# Patient Record
Sex: Male | Born: 1935
Health system: Southern US, Community
[De-identification: ages and names within clinical notes are randomized; demographics above are authoritative.]

## PROBLEM LIST (undated history)

## (undated) ENCOUNTER — Ambulatory Visit: Admission: EM | Payer: Medicare HMO

## (undated) DIAGNOSIS — I10 Essential (primary) hypertension: Secondary | ICD-10-CM

## (undated) DIAGNOSIS — Z83719 Family history of colon polyps, unspecified: Secondary | ICD-10-CM

## (undated) DIAGNOSIS — I4892 Unspecified atrial flutter: Secondary | ICD-10-CM

## (undated) DIAGNOSIS — E785 Hyperlipidemia, unspecified: Secondary | ICD-10-CM

## (undated) DIAGNOSIS — Z8371 Family history of colonic polyps: Secondary | ICD-10-CM

## (undated) DIAGNOSIS — C801 Malignant (primary) neoplasm, unspecified: Secondary | ICD-10-CM

## (undated) DIAGNOSIS — I4510 Unspecified right bundle-branch block: Secondary | ICD-10-CM

## (undated) DIAGNOSIS — Z8546 Personal history of malignant neoplasm of prostate: Secondary | ICD-10-CM

## (undated) DIAGNOSIS — K449 Diaphragmatic hernia without obstruction or gangrene: Secondary | ICD-10-CM

## (undated) HISTORY — DX: Family history of colon polyps, unspecified: Z83.719

## (undated) HISTORY — PX: EYE SURGERY: SHX253

## (undated) HISTORY — DX: Family history of colonic polyps: Z83.71

## (undated) HISTORY — PX: PROSTATE SURGERY: SHX751

## (undated) HISTORY — DX: Unspecified right bundle-branch block: I45.10

## (undated) HISTORY — DX: Malignant (primary) neoplasm, unspecified: C80.1

## (undated) HISTORY — DX: Unspecified atrial flutter: I48.92

## (undated) HISTORY — DX: Personal history of malignant neoplasm of prostate: Z85.46

## (undated) HISTORY — DX: Diaphragmatic hernia without obstruction or gangrene: K44.9

## (undated) HISTORY — DX: Essential (primary) hypertension: I10

## (undated) HISTORY — PX: HERNIA REPAIR: SHX51

## (undated) HISTORY — DX: Hyperlipidemia, unspecified: E78.5

---

## 1998-04-30 ENCOUNTER — Encounter: Admission: RE | Admit: 1998-04-30 | Discharge: 1998-07-29 | Payer: Self-pay | Admitting: Urology

## 1998-07-30 ENCOUNTER — Encounter: Admission: RE | Admit: 1998-07-30 | Discharge: 1998-10-28 | Payer: Self-pay | Admitting: Radiation Oncology

## 1998-09-21 ENCOUNTER — Encounter: Payer: Self-pay | Admitting: Urology

## 1998-09-21 ENCOUNTER — Ambulatory Visit (HOSPITAL_BASED_OUTPATIENT_CLINIC_OR_DEPARTMENT_OTHER): Admission: RE | Admit: 1998-09-21 | Discharge: 1998-09-21 | Payer: Self-pay | Admitting: Urology

## 1998-11-03 ENCOUNTER — Encounter: Admission: RE | Admit: 1998-11-03 | Discharge: 1998-12-14 | Payer: Self-pay | Admitting: Radiation Oncology

## 1998-12-15 ENCOUNTER — Encounter: Admission: RE | Admit: 1998-12-15 | Discharge: 1999-03-15 | Payer: Self-pay | Admitting: Radiation Oncology

## 2003-01-24 ENCOUNTER — Encounter: Payer: Self-pay | Admitting: Ophthalmology

## 2003-01-28 ENCOUNTER — Ambulatory Visit (HOSPITAL_COMMUNITY): Admission: RE | Admit: 2003-01-28 | Discharge: 2003-01-28 | Payer: Self-pay | Admitting: Ophthalmology

## 2005-02-17 ENCOUNTER — Encounter (HOSPITAL_COMMUNITY): Admission: RE | Admit: 2005-02-17 | Discharge: 2005-05-10 | Payer: Self-pay | Admitting: Urology

## 2005-08-10 ENCOUNTER — Emergency Department (HOSPITAL_COMMUNITY): Admission: EM | Admit: 2005-08-10 | Discharge: 2005-08-10 | Payer: Self-pay | Admitting: Emergency Medicine

## 2005-10-13 ENCOUNTER — Encounter: Admission: RE | Admit: 2005-10-13 | Discharge: 2005-10-13 | Payer: Self-pay | Admitting: Family Medicine

## 2006-04-26 ENCOUNTER — Encounter (HOSPITAL_COMMUNITY): Admission: RE | Admit: 2006-04-26 | Discharge: 2006-05-01 | Payer: Self-pay | Admitting: Urology

## 2009-12-02 ENCOUNTER — Ambulatory Visit: Payer: Self-pay | Admitting: Family Medicine

## 2009-12-17 ENCOUNTER — Encounter: Payer: Self-pay | Admitting: Family Medicine

## 2009-12-24 ENCOUNTER — Ambulatory Visit: Payer: Self-pay | Admitting: Family Medicine

## 2009-12-24 ENCOUNTER — Telehealth: Payer: Self-pay | Admitting: Cardiovascular Disease

## 2009-12-24 DIAGNOSIS — I4892 Unspecified atrial flutter: Secondary | ICD-10-CM

## 2009-12-25 DIAGNOSIS — R000 Tachycardia, unspecified: Secondary | ICD-10-CM

## 2009-12-25 DIAGNOSIS — I4510 Unspecified right bundle-branch block: Secondary | ICD-10-CM | POA: Insufficient documentation

## 2009-12-28 ENCOUNTER — Encounter: Payer: Self-pay | Admitting: Cardiovascular Disease

## 2009-12-28 ENCOUNTER — Ambulatory Visit: Payer: Self-pay | Admitting: Internal Medicine

## 2009-12-28 ENCOUNTER — Ambulatory Visit: Payer: Self-pay | Admitting: Cardiology

## 2009-12-28 ENCOUNTER — Ambulatory Visit (HOSPITAL_COMMUNITY): Admission: RE | Admit: 2009-12-28 | Discharge: 2009-12-28 | Payer: Self-pay | Admitting: Cardiovascular Disease

## 2009-12-28 ENCOUNTER — Ambulatory Visit: Payer: Self-pay

## 2009-12-30 ENCOUNTER — Encounter: Payer: Self-pay | Admitting: Internal Medicine

## 2009-12-30 ENCOUNTER — Telehealth: Payer: Self-pay | Admitting: Internal Medicine

## 2010-01-05 ENCOUNTER — Ambulatory Visit (HOSPITAL_COMMUNITY): Admission: RE | Admit: 2010-01-05 | Discharge: 2010-01-06 | Payer: Self-pay | Admitting: Internal Medicine

## 2010-01-05 ENCOUNTER — Ambulatory Visit: Payer: Self-pay | Admitting: Internal Medicine

## 2010-01-07 ENCOUNTER — Ambulatory Visit: Payer: Self-pay | Admitting: Family Medicine

## 2010-02-12 ENCOUNTER — Telehealth: Payer: Self-pay | Admitting: Internal Medicine

## 2010-02-15 ENCOUNTER — Ambulatory Visit: Payer: Self-pay | Admitting: Internal Medicine

## 2010-04-27 ENCOUNTER — Encounter: Payer: Self-pay | Admitting: Family Medicine

## 2010-05-03 ENCOUNTER — Ambulatory Visit: Payer: Self-pay | Admitting: Family Medicine

## 2010-05-03 DIAGNOSIS — J019 Acute sinusitis, unspecified: Secondary | ICD-10-CM

## 2010-06-13 LAB — CONVERTED CEMR LAB
Alkaline Phosphatase: 75 units/L (ref 39–117)
BUN: 18 mg/dL (ref 6–23)
Basophils Absolute: 0 10*3/uL (ref 0.0–0.1)
Bilirubin, Direct: 0.2 mg/dL (ref 0.0–0.3)
CO2: 26 meq/L (ref 19–32)
Calcium: 9.2 mg/dL (ref 8.4–10.5)
Chloride: 108 meq/L (ref 96–112)
Creatinine, Ser: 0.9 mg/dL (ref 0.4–1.5)
Eosinophils Absolute: 0 10*3/uL (ref 0.0–0.7)
Glucose, Bld: 78 mg/dL (ref 70–99)
Lymphocytes Relative: 24.4 % (ref 12.0–46.0)
MCHC: 34.8 g/dL (ref 30.0–36.0)
Neutro Abs: 4.8 10*3/uL (ref 1.4–7.7)
Neutrophils Relative %: 64.5 % (ref 43.0–77.0)
Platelets: 160 10*3/uL (ref 150.0–400.0)
Potassium: 4.2 meq/L (ref 3.5–5.1)
RDW: 14.8 % — ABNORMAL HIGH (ref 11.5–14.6)
Sodium: 145 meq/L (ref 135–145)
Total Bilirubin: 0.8 mg/dL (ref 0.3–1.2)
Total Protein: 6.5 g/dL (ref 6.0–8.3)
VLDL: 21.4 mg/dL (ref 0.0–40.0)

## 2010-06-15 NOTE — Assessment & Plan Note (Signed)
Summary: HOSPITAL FOLLOWUP/KN   Vital Signs:  Patient profile:   77 year old male Weight:      203 pounds Pulse rate:   82 / minute BP sitting:   128 / 62  (left arm)  Vitals Entered By: Doristine Devoid CMA (January 07, 2010 3:01 PM) CC: hosp f/u    History of Present Illness: 76 yo man here today for hospital f/u after ablation for Aflutter.  has had some cough s/p TEE but this is improving.  metoprolol has decreased to 1/2 tab two times a day.  reports feeling well- no CP, palpitations, SOB.  now on Pradaxa for 30 days.  reviewed D/C summary.  Problems Prior to Update: 1)  Physical Examination  (ICD-V70.0) 2)  Right Bundle Branch Block  (ICD-426.4) 3)  Tachycardia  (ICD-785.0) 4)  Atrial Flutter  (ICD-427.32)  Current Medications (verified): 1)  Fish Oil .Marland Kitchen.. 1 By Mouth Qd 2)  Metoprolol Tartrate 50 Mg Tabs (Metoprolol Tartrate) .... Take 1/2 Tab By Mouth Two Times Per Day 3)  Pradaxa 150 Mg Caps (Dabigatran Etexilate Mesylate) .... 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  Married Alcohol use-occasional beer (not daily) Drug use-no Former Smoker-quit at age 40  Review of Systems      See HPI  Physical Exam  General:  Well-developed,well-nourished,in no acute distress; alert,appropriate and cooperative throughout examination Lungs:  Normal respiratory effort, chest expands symmetrically. Lungs are clear to auscultation, no crackles or wheezes. Heart:  reg S1/S2, no M/R/G Pulses:  +2 carotid, radial, PT Extremities:  +1 edema of LE bilaterally Psych:  Oriented X3, memory intact for recent and remote, normally interactive, good eye contact, not anxious  appearing, and not depressed appearing.     Impression & Recommendations:  Problem # 1:  ATRIAL FLUTTER (ICD-427.32) Assessment Unchanged s/p radiofrequency ablation.  doing well.  asymptomatic.  reviewed duration of Pradaxa tx and new dose of metoprolol.  pt aware and understands. His updated medication list for this problem includes:    Metoprolol Tartrate 50 Mg Tabs (Metoprolol tartrate) .Marland Kitchen... Take 1/2 tab by mouth two times per day  Complete Medication List: 1)  Fish Oil  .Marland Kitchen.. 1 by mouth qd 2)  Metoprolol Tartrate 50 Mg Tabs (Metoprolol tartrate) .... Take 1/2 tab by mouth two times per day 3)  Pradaxa 150 Mg Caps (Dabigatran etexilate mesylate) .... One by mouth two times a day  Patient Instructions: 1)  Follow up in 6 months- sooner if needed 2)  Go see cardiology as scheduled on Oct 3rd 3)  You'll take 1 month of the Pradaxa and then stop 4)  Continue the 1/2 tab of Metoprolol until Cardiology says otherwise 5)  Call with any questions or concerns 6)  I'm so glad you're doing well!!!

## 2010-06-15 NOTE — Assessment & Plan Note (Signed)
Summary: CPX/FASTING//KN--Rm 17   Vital Signs:  Patient profile:   77 year old male Height:      70 inches Weight:      195 pounds BMI:     28.08 Temp:     97.8 degrees F oral Pulse rate:   90 / minute Pulse rhythm:   irregular Resp:     16 per minute BP sitting:   130 / 90  (right arm) Cuff size:   large  Vitals Entered By: Mervin Kung CMA Duncan Dull) (December 24, 2009 7:56 AM) Is Patient Diabetic? No   History of Present Illness: 77 yo man here today for CPE.    Here for Medicare AWV:  1.   Risk factors based on Past M, S, F history: prostate CA- follows w/ Dr Isabel Caprice, had appt last month overweight- not exercising, doing yard work regularly, due for screening lipids, BMP 2.   Physical Activities: doing yard work 3.   Depression/mood: denies sxs of depression, feels mood is 'good' 4.   Hearing: no concerns, normal to whispered voice 5.   ADL's: independent 6.   Fall Risk: doesn't feel he's at risk for falls 7.   Home Safety: lives w/ wife, feels safe at home, tries to avoid basement stairs 8.   Height, weight, &visual acuity: See vitals, vision corrected to 20/20 w/ glasses 9.   Counseling: provided on healthy diet, regular exercise, eye exams, dental exams 10.   Labs ordered based on risk factors: see A&P 11.           Referral Coordination: colonoscopy 4-5 yrs ago, eye exam 7/11 12.           Care Plan: see A&P 13.           Cognitive Assessment: 2/3 object recall, W-O-R-L-D   Preventive Screening-Counseling & Management  Alcohol-Tobacco     Alcohol drinks/day: <1     Alcohol type: beer     Smoking Status: quit     Year Quit: 1961  Caffeine-Diet-Exercise     Does Patient Exercise: no      Sexual History:  currently monogamous.        Drug Use:  never.    Current Medications (verified): 1)  Fish Oil .Marland Kitchen.. 1 By Mouth Qd  Allergies (verified): No Known Drug Allergies  Past History:  Past Medical History: Last updated: 12/02/2009 Prostate Cancer Colon  polyps  Past Surgical History: Last updated: 12/02/2009 Prostate Seed Implant  Family History: Last updated: 12/02/2009 CAD- none Parkinsons- brothers, sister Prostate CA- brother  Social History: Last updated: 12/02/2009 Retired, previously a Naval architect Married Alcohol use-yes Drug use-no Former Social research officer, government at age 50  Social History: Does Patient Exercise:  no Drug Use:  never  Review of Systems  The patient denies anorexia, fever, weight loss, weight gain, vision loss, decreased hearing, hoarseness, chest pain, syncope, dyspnea on exertion, peripheral edema, prolonged cough, headaches, abdominal pain, melena, hematochezia, severe indigestion/heartburn, hematuria, suspicious skin lesions, depression, abnormal bleeding, enlarged lymph nodes, and testicular masses.    Physical Exam  General:  Well-developed,well-nourished,in no acute distress; alert,appropriate and cooperative throughout examination Head:  Normocephalic and atraumatic without obvious abnormalities. No apparent alopecia or balding. Eyes:  PERRL, EOMI Ears:  External ear exam shows no significant lesions or deformities.  Otoscopic examination reveals clear canals, tympanic membranes are intact bilaterally without bulging, retraction, inflammation or discharge- extensive scarring. Hearing is grossly normal bilaterally. Nose:  External nasal examination shows no deformity or inflammation. Nasal  mucosa are pink and moist without lesions or exudates. Mouth:  Oral mucosa and oropharynx without lesions or exudates.  Teeth in good repair. Neck:  No deformities, masses, or tenderness noted. Lungs:  Normal respiratory effort, chest expands symmetrically. Lungs are clear to auscultation, no crackles or wheezes. Heart:  reg S1/S2, no M/R/G Abdomen:  Bowel sounds positive,abdomen soft and non-tender without masses, organomegaly or hernias noted. Rectal:  deferred to urology (exam 7/11) Genitalia:  Testes bilaterally  descended without nodularity, tenderness or masses. No scrotal masses or lesions. No penis lesions or urethral discharge. Prostate:  deferred to urology Pulses:  +2 carotid, radial, DP Extremities:  No clubbing, cyanosis, edema, or deformity noted with normal full range of motion of all joints.   Neurologic:  No cranial nerve deficits noted. Station and gait are normal. Plantar reflexes are down-going bilaterally. DTRs are symmetrical throughout. Sensory, motor and coordinative functions appear intact. Skin:  Intact without suspicious lesions or rashes Cervical Nodes:  No lymphadenopathy noted Inguinal Nodes:  No significant adenopathy Psych:  Cognition and judgment appear intact. Alert and cooperative with normal attention span and concentration. No apparent delusions, illusions, hallucinations   Impression & Recommendations:  Problem # 1:  PHYSICAL EXAMINATION (ICD-V70.0) Assessment New PE WNL w/ exception of EKG (see below).  UTD on health maintainence.  anticipatory guidance provided. Orders: Venipuncture (27517) EKG w/ Interpretation (93000) TLB-Lipid Panel (80061-LIPID) TLB-BMP (Basic Metabolic Panel-BMET) (80048-METABOL) TLB-CBC Platelet - w/Differential (85025-CBCD) TLB-Hepatic/Liver Function Pnl (80076-HEPATIC) TLB-TSH (Thyroid Stimulating Hormone) (84443-TSH) Medicare -1st Annual Wellness Visit [phone])  Problem # 2:  ATRIAL FLUTTER (ICD-427.32) Assessment: New  pt had routine screening EKG and found to be in asymptomatic aflutter.  called cards and had discussion- pt to start ASA and metoprolol and f/u w/ cards on Monday.  pt aware and in agreement. His updated medication list for this problem includes:    Metoprolol Tartrate 50 Mg Tabs (Metoprolol tartrate) .Marland Kitchen... Take 1 tab by mouth two times per day  Orders: Prescription Created Electronically [id number])  Complete Medication List: 1)  Fish Oil  .Marland Kitchen.. 1 by mouth qd 2)  Metoprolol Tartrate 50 Mg Tabs (Metoprolol tartrate)  .... Take 1 tab by mouth two times per day  Patient Instructions: 1)  Someone will call you from the cardiology office to let you know about your appointment on Monday 2)  We'll notify you of your lab results and determine when you need to follow up 3)  Please start taking an Aspirin daily- 325mg  4)  Start the Metoprolol two times a day as directed 5)  Call with any questions or concerns 6)  Hang in there! Prescriptions: METOPROLOL TARTRATE 50 MG TABS (METOPROLOL TARTRATE) Take 1 tab by mouth two times per day  #60 x 3   Entered and Authorized by:   [name] MD   Signed by:   [name] MD on 12/24/2009   Method used:   Electronically to        [facility] [address] [phone]* (retail)       [address]       [place], [place]  [address]       Ph: [phone]       Fax: [phone]   RxID:   [id number]   Current Allergies (reviewed today): No known allergies    Preventive Care Screening     Pt thinks he had colonoscopy 4-5 years ago?-- ?polyps  thinks he had bone density 4 yrs ago but doesn't remember the result.  Also thinks he had pneumovax 2 or 3 yrs ago.  Nicki Guadalajara Fergerson CMA (AAMA)  December 24, 2009 8:03 AM     Vital Signs:  Patient Profile:   77 year old male Height:     70 inches (177.80 cm) Weight:      195 pounds BMI:     28.08 Temp:     97.8 degrees F oral Pulse rate:   90 / minute Pulse rhythm:   irregular Resp:     16 per minute BP sitting:   130 / 90 Cuff size:   large

## 2010-06-15 NOTE — Letter (Signed)
Summary: [facility]  [facility]   Imported By: Lanelle Bal 12/29/2009 10:52:45  _____________________________________________________________________  External Attachment:    Type:   Image     Comment:   External Document

## 2010-06-15 NOTE — Progress Notes (Signed)
Summary: teeth clean on 8/31  Phone Note From Other Clinic   Caller: carol dtr- dental office [phone] Request: Talk with Nurse Summary of Call: pt having dental work on 8/31 -is this o.k. for pt to have his teeth clean  Initial call taken by: Lorne Skeens,  December 30, 2009 2:39 PM  Follow-up for Phone Call        ok to have teeth cleaned Dennis Bast, RN, BSN  December 30, 2009 4:30 PM

## 2010-06-15 NOTE — Progress Notes (Signed)
Summary: DOD call  Phone Note From Other Clinic   Caller: Dr Beverely Low Call For: Dr Excell Seltzer Summary of Call: Dr Excell Seltzer took a DOD call from Dr Beverely Low on this pt.  EKG shows Atrial Flutter rate 129, pt asymptomatic per Dr Beverely Low.  Dr Excell Seltzer would like the pt seen by Dr Johney Frame on 12/28/09 (NEP) with an echocardiogram.  Order placed.  Initial call taken by: Julieta Gutting, RN, BSN,  December 24, 2009 9:35 AM  New Problems: ATRIAL FLUTTER (ICD-427.32)   New Problems: ATRIAL FLUTTER (ICD-427.32)

## 2010-06-15 NOTE — Letter (Signed)
Summary: ELectrophysiology/Ablation Procedure Instructions  [facility], Main Office  [address]   [place], [place] [address]   Phone: [phone]  Fax: [phone]     Electrophysiology/Ablation Procedure Instructions    You are scheduled for a(n) TEE and flutter ablation on 01/05/2010 at 12:30am with Dr. Johney Frame.  1.  Please come to the Short Stay Center at [facility] at 10:30am on the day of your procedure.  2.  Come prepared to stay overnight.   Please bring your insurance cards and a list of your medications.  3.  Do not have anything to eat or drink after midnight the night before your procedure.  4.  Do NOT take these medications for the morning of your procedure unless otherwise instructed: Metoprolol.  All of your remaining medications may be taken with a small amount of water.  6.  Educational material received:   Ablation   * Occasionally, EP studies and ablations can become lengthy.  Please make your family aware of this before your procedure starts.  Average time ranges from 2-8 hours for EP studies/ablations.  Your physician will locate your family after the procedure with the results.  * If you have any questions after you get home, please call the office at [phone]. Anselm Pancoast

## 2010-06-15 NOTE — Assessment & Plan Note (Signed)
Summary: new to est//possible cold//lch   Vital Signs:  Patient profile:   77 year old male Height:      70 inches (177.80 cm) Weight:      195.25 pounds (88.75 kg) BMI:     28.12 Temp:     98.3 degrees F (36.83 degrees C) oral BP sitting:   126 / 90  (right arm) Cuff size:   regular  Vitals Entered By: Lucious Groves CMA (December 02, 2009 11:21 AM) CC: NP est care./kb Is Patient Diabetic? No Pain Assessment Patient in pain? no      Comments Patient is transferring from Dr. Elsworth Soho with [facility]./kb   History of Present Illness: 77 yo man here today to establish care.  Previous MD- Lupe Carney.  GI- [facility].  Uro- Isabel Caprice.  cough- sxs started 1 week ago.  productive of white sputum.  low grade temps at home.  minimal nasal congestion.  no ear pain.  intermittant sore throat.  + sick contacts.  using OTC Mucinex.  Preventive Screening-Counseling & Management  Alcohol-Tobacco     Alcohol drinks/day: <1     Smoking Status: quit     Year Quit: at age 8      Sexual History:  currently monogamous.        Drug Use:  no.    Current Medications (verified): 1)  Fish Oil .Marland Kitchen.. 1 By Mouth Qd  Allergies (verified): No Known Drug Allergies  Past History:  Past Medical History: Prostate Cancer Colon polyps  Past Surgical History: Prostate Seed Implant  Family History: CAD- none Parkinsons- brothers, sister Prostate CA- brother  Social History: Retired, previously a Naval architect Married Alcohol use-yes Drug use-no Former Social research officer, government at age 77 Drug Use:  no Smoking Status:  quit Sexual History:  currently monogamous  Review of Systems General:  Complains of fever and malaise; denies chills, fatigue, loss of appetite, sweats, weakness, and weight loss. ENT:  Denies earache, hoarseness, nasal congestion, nosebleeds, postnasal drainage, sinus pressure, and sore throat. CV:  Denies chest pain or discomfort, difficulty breathing at night, and difficulty  breathing while lying down. Resp:  Complains of cough and sputum productive; denies shortness of breath and wheezing. Heme:  Denies enlarge lymph nodes.  Physical Exam  General:  Well-developed,well-nourished,in no acute distress; alert,appropriate and cooperative throughout examination Head:  Normocephalic and atraumatic without obvious abnormalities. No apparent alopecia or balding.  no TTP over sinuses Eyes:  no injxn or inflammation Ears:  External ear exam shows no significant lesions or deformities.  Otoscopic examination reveals clear canals, tympanic membranes are intact bilaterally without bulging, retraction, inflammation or discharge. Hearing is grossly normal bilaterally. Nose:  + congestion Mouth:  + PND, otherwise WNL Lungs:  Normal respiratory effort, chest expands symmetrically. Lungs are clear to auscultation, no crackles or wheezes. Heart:  reg S1/S2, no M/R/G Cervical Nodes:  No lymphadenopathy noted   Impression & Recommendations:  Problem # 1:  BRONCHITIS- ACUTE (ICD-466.0) Assessment New  given duration of illness will start abx.  reviewed supportive care and red flags that should prompt return.  Pt expresses understanding and is in agreement w/ this plan. His updated medication list for this problem includes:    Azithromycin 250 Mg Tabs (Azithromycin) .Marland Kitchen... 2 by  mouth today and then 1 daily for 4 days  Orders: Prescription Created Electronically [id number])  Complete Medication List: 1)  Fish Oil  .Marland Kitchen.. 1 by mouth qd 2)  Azithromycin 250 Mg Tabs (Azithromycin) .... 2 by  mouth today and then 1 daily for 4 days  Patient Instructions: 1)  Schedule a physical at your convenience- don't eat before this appt so we can check your labs 2)  This appears to be a bronchitis 3)  Take the Azithromycin as directed 4)  Use Delsym or Robitussin as needed for cough (over the counter) 5)  Continue the mucinex to thin your congestion 6)  Call if no improvement or at any time  worsening 7)  Hang in there! Prescriptions: AZITHROMYCIN 250 MG  TABS (AZITHROMYCIN) 2 by  mouth today and then 1 daily for 4 days  #6 x 0   Entered and Authorized by:   [name] MD   Signed by:   [name] MD on 12/02/2009   Method used:   Electronically to        [facility] [address] [phone]* (retail)       [address]       [place], [place]  [address]       Ph: [phone]       Fax: [phone]   RxID:   [id number]     Immunization History:  Influenza Immunization History:    Influenza:  historical (02/13/2009)

## 2010-06-15 NOTE — Assessment & Plan Note (Signed)
Summary: np eval for a-flutter/echo at 4pm if dr Tyasia Packard running behind...   Visit Type:  Initial Consult Primary Provider:  [name] MD   History of Present Illness: Trevor Galloway is a pleasant 77 yo WM with a h/o recently diagnosed atrial flutter who presents today for EP consultation.  He reports being diagnosed with atrial flutter 12/24/09 after presenting to Dr Beverely Low for routine evaluation.  EKG at that time revealed atrial flutter with 2:1 AV conduction. He is unaware of atrial flutter.  The patient denies symptoms of palpitations, chest pain, shortness of breath, orthopnea, PND, lower extremity edema, dizziness, presyncope, syncope, or neurologic sequela.  His wife feels that he has been "fatigued".   He was started on metoprolol at that time.  The patient is tolerating medications without difficulties and is otherwise without complaint today.   Current Medications (verified): 1)  Fish Oil .Marland Kitchen.. 1 By Mouth Qd 2)  Metoprolol Tartrate 50 Mg Tabs (Metoprolol Tartrate) .... Take 1 Tab By Mouth Two Times Per Day 3)  Aspirin 81 Mg Tbec (Aspirin) .... Take One Tablet By Mouth Daily  Allergies (verified): No Known Drug Allergies  Past History:  Past Medical History: Prostate Cancer Colon polyps RIGHT BUNDLE BRANCH BLOCK (ICD-426.4) ATRIAL FLUTTER (ICD-427.32) hiatal hernia  Past Surgical History: Prostate Seed Implant s/p caratact repair (bilateral) hernia repair  Family History: Reviewed history from 12/02/2009 and no changes required. 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   Vital Signs:  Patient profile:   77 year old male Height:      70 inches Weight:      205 pounds Pulse rate:   126  / minute BP sitting:   146 / 80  (left arm)  Vitals Entered By: Laurance Flatten CMA (December 28, 2009 4:25 PM)  Physical Exam  General:  elderly, NAD Head:  Normocephalic and atraumatic without obvious abnormalities. No apparent alopecia or balding. Eyes:  PERRL, EOMI Mouth:  Oral mucosa and oropharynx without lesions or exudates.  Teeth in good repair. Neck:  supple, no TM or JVD Lungs:  Normal respiratory effort, chest expands symmetrically. Lungs are clear to auscultation, no crackles or wheezes. Heart:  tachy regular rhythm, no m/r/g Abdomen:  Bowel sounds positive,abdomen soft and non-tender without masses, organomegaly or hernias noted. Msk:  no atrophy Pulses:  2+ DP/PT pulses Extremities:  trace pedal edema Neurologic:  strength/sensation are intact Skin:  Intact without suspicious lesions or rashes Cervical Nodes:  No lymphadenopathy noted Psych:  full affect   EKG  Procedure date:  12/28/2009  Findings:      typical appearing atrial flutter with 2:1 AV nodal conduction, otherwise normal ekg  Impression & Recommendations:  Problem # 1:  ATRIAL FLUTTER (ICD-427.32) The patient presents today for further evaluation of typical appearing atrial flutter.  He is minimally symptomatic, though ventricular rates are elevated.  His CHADS2 score is 1 (age>75).  Therapeutic strategies for atrial flutter including medicine and ablation were discussed in detail with the patient today. Risk, benefits, and alternatives to EP study and radiofrequency ablation for atrial flutter were also discussed in detail today. These risks include but are not limited to stroke, bleeding, vascular damage, tamponade, perforation, damage to the  heart and other structures, AV block requiring pacemaker, worsening renal function, and death. The patient understands these risk and wishes to proceed.  We will initiate Pradaxa 150mg  two times a day today.  I have made if very clear to pt and his spouse that this is to  be taken twice daily.   We will proceed with TEE guided EP study and ablation at the next available time. I have increased metoprolol to 75mg  two times a day today for rate control.  Patient Instructions: 1)  Your physician has requested that you have a TEE.  During a TEE, sound waves are used to create images of your heart. It provides your doctor with information about the size and shape of your heart and how well your heart's chambers and valves are working. In this test, a transducer is attached to the end of a flexible tube that's guided down your throat and into your esophagus (the tube leading from your mouth to your stomach) to get a more detailed image of your heart. You are not awake for the procedure. Please see the instruction sheet given to you today.  For further information please visit [email or web address] 2)  Your physician has recommended that you have an ablation.  Catheter ablation is a medical procedure used to treat some cardiac arrhythmias (irregular heartbeats). During catheter ablation, a long, thin, flexible tube is put into a blood vessel in your groin (upper thigh), or neck. This tube is called an ablation catheter. It is then guided to your heart through the blood vessel. Radiofrequency waves destroy small areas of heart tissue where abnormal heartbeats may cause an arrhythmia to start.  Please see the instruction sheet given to you today. 3)  Your physician has recommended you make the following change in your medication: start Pradaxa 150mg  two times a day,stop aspirin and increase Metoprolol to 75mg  bid Prescriptions: METOPROLOL TARTRATE 50 MG TABS (METOPROLOL TARTRATE) Take 1 1/2 tab by mouth two times per day  #90 x 11   Entered by:   Dennis Bast, RN, BSN   Authorized by:   Hillis Range, MD   Signed by:   Dennis Bast, RN, BSN on 12/28/2009   Method used:   Electronically to        [facility] [address] [phone]* (retail)       [address]       [place], [place]   [address]       Ph: [phone]       Fax: [phone]   RxID:   [id number] PRADAXA 150 MG CAPS (DABIGATRAN ETEXILATE MESYLATE) one by mouth two times a day  #60 x 1   Entered by:   Dennis Bast, RN, BSN   Authorized by:   Hillis Range, MD   Signed by:   Dennis Bast, RN, BSN on 12/28/2009   Method used:   Electronically to        [facility] [phone]* (retail)       [address]       [place], [place]  [address]       Ph: [phone]       Fax: [phone]   RxID:   [id number]

## 2010-06-15 NOTE — Assessment & Plan Note (Signed)
Summary: Trevor Galloway   Visit Type:  Follow-up Primary Trevor Galloway:  Trevor Rhymes MD   History of Present Illness: The patient presents today for routine electrophysiology followup. He reports doing very well since his atrial flutter ablation.  He denies procedure related complications. The patient denies symptoms of palpitations, chest pain, shortness of breath, orthopnea, PND, lower extremity edema, dizziness, presyncope, syncope, or neurologic sequela. The patient is tolerating medications without difficulties and is otherwise without complaint today.   Current Medications (verified): 1)  Fish Oil .Marland Kitchen.. 1 By Mouth Qd 2)  Metoprolol Tartrate 50 Mg Tabs (Metoprolol Tartrate) .... Take 1/2 Tab By Mouth Two Times Per Day  Allergies (verified): No Known Drug Allergies  Past History:  Past Medical History: Prostate Cancer Colon polyps RIGHT BUNDLE BRANCH BLOCK (ICD-426.4) ATRIAL FLUTTER s/p CTI ablation 8/11 hiatal hernia  Past Surgical History: Prostate Seed Implant s/p caratact repair (bilateral) hernia repair CTI ablation for atrial flutter 01/05/10  Vital Signs:  Patient profile:   77 year old male Height:      70 inches Weight:      200 pounds BMI:     28.80 Pulse rate:   57 / minute BP sitting:   118 / 70  (left arm)  Vitals Entered By: Laurance Flatten CMA (February 15, 2010 3:48 PM)  Physical Exam  General:  Well developed, well nourished, in no acute distress. Head:  normocephalic and atraumatic Eyes:  PERRLA/EOM intact; conjunctiva and lids normal. Mouth:  Teeth, gums and palate normal. Oral mucosa normal. Neck:  Neck supple, no JVD. No masses, thyromegaly or abnormal cervical nodes. Lungs:  Clear bilaterally to auscultation and percussion. Heart:  Non-displaced PMI, chest non-tender; regular rate and rhythm, S1, S2 without murmurs, rubs or gallops. Carotid upstroke normal, no bruit. Normal abdominal aortic size, no bruits. Femorals normal pulses, no bruits. Pedals  normal pulses. No edema, no varicosities. Abdomen:  Bowel sounds positive; abdomen soft and non-tender without masses, organomegaly, or hernias noted. No hepatosplenomegaly. Msk:  Back normal, normal gait. Muscle strength and tone normal. Pulses:  pulses normal in all 4 extremities Extremities:  No clubbing or cyanosis. Neurologic:  Alert and oriented x 3.   EKG  Procedure date:  02/15/2010  Findings:      sinus bradycardia 57 bpm, PR 192, Qtc 402, LAD  Impression & Recommendations:  Problem # 1:  ATRIAL FLUTTER (ICD-427.32) doing well s/p ablation without recurrence stop pradaxa and return to ASA 325mg  daily decrease metoprolol to 25mg  daily x 2 weeks, then stop metoprolol  Patient Instructions: 1)  Your physician recommends that you schedule a follow-up appointment in: as needed 2)  Your physician has recommended you make the following change in your medication: Aspirin 325mg  daily 3)  decrease Metoprolol to 25mg  daily for 2 weeks then stop

## 2010-06-15 NOTE — Progress Notes (Signed)
Summary: question re med  Phone Note Call from Patient   Caller: Patient Reason for Call: Talk to Nurse Summary of Call: pt on pradaxa, dr told him he may come off it and he has appt on monday, however he will run out after sunday's dose,so he will not have monday am's dose, appt here monday pm, what to do? [phone]  Initial call taken by: Glynda Jaeger,  February 12, 2010 10:01 AM  Follow-up for Phone Call        It has been longer than 4 weeks on Pradaxa.  So he will be fine Dennis Bast, RN, BSN  February 12, 2010 11:00 AM

## 2010-06-17 NOTE — Letter (Signed)
Summary: [facility]  [facility]   Imported By: Lanelle Bal 05/06/2010 12:01:23  _____________________________________________________________________  External Attachment:    Type:   Image     Comment:   External Document

## 2010-06-17 NOTE — Assessment & Plan Note (Signed)
Summary: SINUS AND COLD//PH   Vital Signs:  Patient profile:   77 year old male Weight:      202 pounds BMI:     29.09 Temp:     98.1 degrees F oral BP sitting:   126 / 78  (left arm)  Vitals Entered By: Doristine Devoid CMA (May 03, 2010 11:19 AM) CC: sinus congestion and cough x1 week    History of Present Illness: 77 yo man here today for nasal congestion, bloody drainage.  sxs started 5-6 days ago.  no fevers.  denies facial pain.  + laryngitis.  no ear pain.  + cough, productive of thick yellow sputum.  no known sick contacts.  Current Medications (verified): 1)  Fish Oil .Marland Kitchen.. 1 By Mouth Qd 2)  Aspirin 325 Mg Tabs (Aspirin) .... One By Mouth Daily  Allergies (verified): No Known Drug Allergies  Review of Systems      See HPI  Physical Exam  General:  Well-developed,well-nourished,in no acute distress; alert,appropriate and cooperative throughout examination Head:  Normocephalic and atraumatic without obvious abnormalities. No apparent alopecia or balding.  mild TTP over maxillary sinuses Eyes:  no injxn or inflammation Ears:  External ear exam shows no significant lesions or deformities.  Otoscopic examination reveals clear canals, tympanic membranes are intact bilaterally without bulging, retraction, inflammation or discharge- extensive scarring. Hearing is grossly normal bilaterally. Nose:  External nasal examination shows no deformity or inflammation. Nasal mucosa are pink and moist without lesions or exudates.  + congestion Mouth:  Oral mucosa and oropharynx without lesions or exudates.  Teeth in good repair. Lungs:  Normal respiratory effort, chest expands symmetrically. Lungs are clear to auscultation, no crackles or wheezes.   Impression & Recommendations:  Problem # 1:  SINUSITIS - ACUTE-NOS (ICD-461.9) Assessment New  mild TTP over maxillary sinuses but given presence of bloody drainage will start abx.  pt requests Azithromycin as this is what he's had  previously w/ good results.  reviewed supportive care and red flags that should prompt return.  Pt expresses understanding and is in agreement w/ this plan. His updated medication list for this problem includes:    Azithromycin 250 Mg Tabs (Azithromycin) .Marland Kitchen... 2 by  mouth today and then 1 daily for 4 days  Orders: Prescription Created Electronically [phone]  Complete Medication List: 1)  Fish Oil  .Marland Kitchen.. 1 by mouth qd 2)  Aspirin 325 Mg Tabs (Aspirin) .... One by mouth daily 3)  Azithromycin 250 Mg Tabs (Azithromycin) .... 2 by  mouth today and then 1 daily for 4 days  Patient Instructions: 1)  Take the Azithromycin as directed for your sinus infection 2)  Drink plenty of fluids 3)  Mucinex will thin your congestion and drainage 4)  Robitussin for cough as needed 5)  REST! 6)  Happy Holidays!!! Prescriptions: AZITHROMYCIN 250 MG  TABS (AZITHROMYCIN) 2 by  mouth today and then 1 daily for 4 days  #6 x 0   Entered and Authorized by:   [name] MD   Signed by:   [name] MD on 05/03/2010   Method used:   Electronically to        [facility] [phone]* (retail)       [address]       [place], [place]  [address]       Ph: [phone]       Fax: [phone]   RxID:   [id number]    Orders Added: 1)  Est.  Patient Level III [[address]] 2)  Prescription Created Electronically [phone]

## 2010-06-22 ENCOUNTER — Encounter: Payer: Self-pay | Admitting: Family Medicine

## 2010-07-05 ENCOUNTER — Encounter: Payer: Self-pay | Admitting: Family Medicine

## 2010-07-05 ENCOUNTER — Other Ambulatory Visit: Payer: Self-pay | Admitting: Family Medicine

## 2010-07-05 ENCOUNTER — Ambulatory Visit (INDEPENDENT_AMBULATORY_CARE_PROVIDER_SITE_OTHER): Payer: Medicare Other | Admitting: Family Medicine

## 2010-07-05 DIAGNOSIS — I4892 Unspecified atrial flutter: Secondary | ICD-10-CM

## 2010-07-05 DIAGNOSIS — E782 Mixed hyperlipidemia: Secondary | ICD-10-CM | POA: Insufficient documentation

## 2010-07-05 DIAGNOSIS — E785 Hyperlipidemia, unspecified: Secondary | ICD-10-CM

## 2010-07-05 DIAGNOSIS — K921 Melena: Secondary | ICD-10-CM | POA: Insufficient documentation

## 2010-07-05 LAB — HEPATIC FUNCTION PANEL
AST: 20 U/L (ref 0–37)
Albumin: 3.9 g/dL (ref 3.5–5.2)
Alkaline Phosphatase: 72 U/L (ref 39–117)
Total Protein: 6.6 g/dL (ref 6.0–8.3)

## 2010-07-05 LAB — LIPID PANEL
Cholesterol: 201 mg/dL — ABNORMAL HIGH (ref 0–200)
HDL: 41.4 mg/dL (ref >39.00)
Total CHOL/HDL Ratio: 5
VLDL: 37.4 mg/dL (ref 0.0–40.0)

## 2010-07-13 NOTE — Assessment & Plan Note (Signed)
Summary: 6 mo f/u -KN   Vital Signs:  Patient profile:   77 year old male Weight:      200 pounds BMI:     28.80 Pulse rate:   84 / minute BP sitting:   130 / 82  (left arm)  Vitals Entered By: Doristine Devoid CMA (July 05, 2010 8:44 AM) CC: 6 MONTH F/U    History of Present Illness: 77 yo man here today for 6 month f/u.  at last visit LDL was mildly elevated- plan was to recheck.  atrial flutter- no palpitations, no CP, SOB, edema.  BRBPR- occurred a few hrs after BM.  painless.  occurred 2 weeks ago.  no bleeding since.  last colonoscopy 4 yrs ago.  Current Medications (verified): 1)  Fish Oil .Marland Kitchen.. 1 By Mouth Qd 2)  Aspirin 325 Mg Tabs (Aspirin) .... One By Mouth Daily  Allergies (verified): No Known Drug Allergies  Past History:  Past medical, surgical, family and social histories (including risk factors) reviewed for relevance to current acute and chronic problems.  Past Medical History: Reviewed history from 02/15/2010 and no changes required. Prostate Cancer Colon polyps RIGHT BUNDLE BRANCH BLOCK (ICD-426.4) ATRIAL FLUTTER s/p CTI ablation 8/11 hiatal hernia  Past Surgical History: Reviewed history from 02/15/2010 and no changes required. Prostate Seed Implant s/p caratact repair (bilateral) hernia repair CTI ablation for atrial flutter 01/05/10  Family History: Reviewed history from 12/02/2009 and no changes required. CAD- none Parkinsons- brothers, sister Prostate CA- brother  Social History: Reviewed history from 12/28/2009 and no changes required. 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  Married Alcohol use-occasional beer (not daily) Drug use-no Former Smoker-quit at age 71  Review of Systems      See HPI  Physical Exam  General:  Well-developed,well-nourished,in no acute distress; alert,appropriate and cooperative throughout examination Head:  Normocephalic and atraumatic without obvious abnormalities. No apparent  alopecia or balding.  Neck:  No deformities, masses, or tenderness noted. Lungs:  Normal respiratory effort, chest expands symmetrically. Lungs are clear to auscultation, no crackles or wheezes. Heart:  reg S1/S2, no M/R/G Abdomen:  soft, NT/ND, +BS Rectal:  No external abnormalities noted. Normal sphincter tone. No rectal masses or tenderness.  heme (-) Pulses:  +2 carotid, radial, PT Extremities:  trace edema bilaterally   Impression & Recommendations:  Problem # 1:  HYPERLIPIDEMIA (ICD-272.4) Assessment New recheck labs.  reviewed healthy diet and regular exercise. Orders: Venipuncture (11914) TLB-Lipid Panel (80061-LIPID) TLB-Hepatic/Liver Function Pnl (80076-HEPATIC) Specimen Handling (78295)  Problem # 2:  ATRIAL FLUTTER (ICD-427.32) Assessment: Unchanged s/p ablation.  asymptomatic.  heart regular today. His updated medication list for this problem includes:    Aspirin 325 Mg Tabs (Aspirin) ..... One by mouth daily  Problem # 3:  HEMATOCHEZIA (ICD-578.1) Assessment: New no blood on exam today.  bleed sounds diverticular.  if sxs reoccur will refer to GI.  Complete Medication List: 1)  Fish Oil  .Marland Kitchen.. 1 by mouth qd 2)  Aspirin 325 Mg Tabs (Aspirin) .... One by mouth daily  Patient Instructions: 1)  Follow up in 6 months for your complete physical 2)  We'll notify you of your lab results 3)  If you again have bleeding from your bottom- please call 4)  Call with any questions or concerns 5)  Hang in there!   Orders Added: 1)  Venipuncture [36415] 2)  TLB-Lipid Panel [80061-LIPID] 3)  TLB-Hepatic/Liver Function Pnl [80076-HEPATIC] 4)  Specimen Handling [99000] 5)  Est. Patient Level IV [  99214] 

## 2010-07-29 LAB — CBC
HCT: 43.5 % (ref 39.0–52.0)
Hemoglobin: 14.9 g/dL (ref 13.0–17.0)
MCH: 32.8 pg (ref 26.0–34.0)
MCHC: 34.3 g/dL (ref 30.0–36.0)
MCV: 95.8 fL (ref 78.0–100.0)

## 2010-07-29 LAB — PROTIME-INR
INR: 1.74 — ABNORMAL HIGH (ref 0.00–1.49)
Prothrombin Time: 20.5 seconds — ABNORMAL HIGH (ref 11.6–15.2)

## 2010-07-29 LAB — BASIC METABOLIC PANEL
BUN: 17 mg/dL (ref 6–23)
GFR calc non Af Amer: >60 mL/min (ref >60)
Glucose, Bld: 90 mg/dL (ref 70–99)
Potassium: 4 mEq/L (ref 3.5–5.1)

## 2010-07-29 LAB — APTT: aPTT: 63 seconds — ABNORMAL HIGH (ref 24–37)

## 2010-10-01 NOTE — H&P (Signed)
   NAME:  Trevor Galloway, Trevor Galloway NO.:  [id number]   MEDICAL RECORD NO.:  [id number]                   PATIENT TYPE:  OIB   LOCATION:  NA                                   FACILITY:  [facility]   PHYSICIAN:  Guadelupe Sabin, M.D.             DATE OF BIRTH:  1927-03-30   DATE OF ADMISSION:  01/28/2003  DATE OF DISCHARGE:                                HISTORY & PHYSICAL   HISTORY OF PRESENT ILLNESS:  This was a planned outpatient admission of this  77 year old white male admitted for cataract implant surgery of the right  eye.   This patient has been followed in my office since May 15, 1992.  He has  gradually developed cataract formation in both eyes, nuclear and posterior  subcapsular right eye. His vision has been reduced in the right eye to 20/40-  and the patient has complained of blurred or dim or smoky vision in the  right eye.  He has been given oral discussion and printed information  concerning the operative procedure.  He signed an informed consent and  arrangements were made for his outpatient admission at this time.   PAST MEDICAL HISTORY:  The patient is under the care of L. Lupe Carney,  M.D. and is felt to be in good general health and be a good risk for the  proposed surgery.   ALLERGIES:  No known drug allergies.   PHYSICAL EXAMINATION:  VITAL SIGNS: As recorded on admission; breech  presentation 149/93, temperature 99, heart rate 91.  GENERAL:  The patient is a pleasant, well-developed, well-nourished, 77-year-  old white male in no acute distress.  HEENT:  Eyes; white and clear with nuclear cataract formation in both eyes  and additional posterior subcapsular change in the right eye.  Applanation  tonometry 20 mm right eye, 17 mm left eye.  Detailed fundus examination  dilated reveals a clear vitreous, attached retina, with normal optic nerve,  blood vessels, and macula.  CHEST:  Lungs are clear to auscultation and percussion.  HEART:  Normal sinus rhythm. No cardiomegaly and no murmurs.  ABDOMEN:  Negative.  EXTREMITIES:  Negative.    ADMISSION DIAGNOSES:  Posterior subcapsular and nuclear cataract right eye.   PROCEDURE:  Cataract implant surgery.                                                Guadelupe Sabin, M.D.    HNJ/MEDQ  D:  01/28/2003  T:  01/28/2003  Job:  [id number]   cc:   L. Lupe Carney, M.D.  [address] [place]  [place] [address]  Fax: [phone]

## 2010-10-01 NOTE — Op Note (Signed)
NAME:  Trevor Galloway, Trevor Galloway                       ACCOUNT NO.:  [id number]   MEDICAL RECORD NO.:  [id number]                   PATIENT TYPE:  OIB   LOCATION:  NA                                   FACILITY:  [facility]   PHYSICIAN:  Guadelupe Sabin, M.D.             DATE OF BIRTH:  1927/03/11   DATE OF PROCEDURE:  01/28/2003  DATE OF DISCHARGE:                                 OPERATIVE REPORT   PREOPERATIVE DIAGNOSIS:  Posterior subcapsular - nuclear cataract, right  eye.   POSTOPERATIVE DIAGNOSIS:  Posterior subcapsular - nuclear cataract, right  eye.   PROCEDURE:  Planned extracapsular cataract extraction - phacoemulsification,  primary insertion of posterior chamber intraocular lens implant.   SURGEON:  Guadelupe Sabin, M.D.   ASSISTANT:  Nurse.   ANESTHESIA:  Local 4% Xylocaine, 0.75% Marcaine retrobulbar block with  Wydase added. Topical Tetracaine, intraocular Xylocaine.  Anesthesia standby  required in this elderly patient.  The patient is given sodium Pentothal  intravenously during the period of retrobulbar injection.   DESCRIPTION OF PROCEDURE:  After the patient was prepped and draped, a lid  speculum was inserted in the right eye.  The eye was turned downward and a  superior rectus traction suture placed.  Schiotz tonometry was recorded at 7  scale units with a 5.5 gram weight. A peritomy was performed adjacent to the  limbus from the 11 to 1 o'clock position.  The corneoscleral junction was  cleaned and a corneoscleral groove made with a 45 degree Superblade.  The  anterior chamber was then entered with the 2.5 mm diamond Keratome at the 12  o'clock position and a 15 degree blade at the 2:30 position.  Using a bent  26 gauge needle on and OcuCoat syringe, a circular capsulorrhexis was begun  and then completed with the Grabow forceps.  Hydrodissection and  hydrodelineation were performed using 1% Xylocaine.  The 30 degree  phacoemulsification tip was then inserted  with slow, controlled  emulsification of the lens nucleus.  Total ultrasonic time; 38 seconds.  Average power level; 15%.  Total amount of fluid used; 60 mL.  Following  removal of the nucleus, the residual cortex and the posterior subcapsular  plaques were removed without complications.  The posterior capsule appeared  intact with a brilliant red fundus reflex.  It was therefore elected to  insert an [employer] [id number] silicon three-piece, posterior  chamber intraocular lens implant.  Diopter strength +18.00.  This was  inserted with the McDonald forceps into the anterior chamber and then  centered into the capsular bag using the [facility] lens rotator. The lens  appeared to be well centered.  The Healon which had been used throughout the  procedure was aspirated and replaced with balanced salt solution and Miochol  ophthalmic solution. The operative incisions appeared to be self sealing and  no sutures were required.  Maxitrol ointment was instilled in the  conjunctival cul-de-sac and a light patch and protector shield applied.  Duration of the procedure and anesthesia administration 45 minutes.  The  patient tolerated the procedure well in general, left the operating room for  the recovery room in good condition.                                               Guadelupe Sabin, M.D.    HNJ/MEDQ  D:  01/28/2003  T:  01/28/2003  Job:  [id number]

## 2011-01-03 ENCOUNTER — Ambulatory Visit (INDEPENDENT_AMBULATORY_CARE_PROVIDER_SITE_OTHER): Payer: Medicare Other | Admitting: Family Medicine

## 2011-01-03 ENCOUNTER — Encounter: Payer: Self-pay | Admitting: Family Medicine

## 2011-01-03 DIAGNOSIS — E785 Hyperlipidemia, unspecified: Secondary | ICD-10-CM

## 2011-01-03 DIAGNOSIS — I4892 Unspecified atrial flutter: Secondary | ICD-10-CM

## 2011-01-03 DIAGNOSIS — Z0000 Encounter for general adult medical examination without abnormal findings: Secondary | ICD-10-CM

## 2011-01-03 NOTE — Patient Instructions (Signed)
Schedule a follow up in 6 months to recheck the cholesterol We'll notify you of your lab results Keep up the good work!  You look great!

## 2011-01-03 NOTE — Progress Notes (Signed)
  Subjective:    Patient ID: Trevor Galloway, male    DOB: 08-28-1926, 77 y.o.   MRN: [id number]  HPI Here today for CPE.  Risk Factors: Prostate cancer- sees Dr Isabel Caprice regularly for PSA and exam Physical Activity: does yard work regularly but no formal exercise Fall risk: some hesitancy w/ climbing stairs, otherwise feels he is low risk Depression: denies current sxs Hearing: normal to conversational tones, difficulty w/ whispered voice ADL's: independent Cognitive: normal linear thought process, no deficits in memory or concentration noted Home Safety: feels safe at home w/ wife Height, Weight, BMI, Visual Acuity: see vitals, vision corrected to 20/20 w/ glasses Counseling: had sigmoidoscopy 3-4 yrs ago w/ Dr Clovis Riley. Labs Ordered: See A&P Care Plan: See A&P    Review of Systems Patient reports no  vision/ hearing changes,anorexia, weight change, fever ,adenopathy, persistant / recurrent hoarseness, swallowing issues, chest pain,palpitations, edema,persistant / recurrent cough, hemoptysis, dyspnea(rest, exertional, paroxysmal nocturnal), gastrointestinal  bleeding (melena, rectal bleeding), abdominal pain, excessive heart burn, GU symptoms( dysuria, hematuria, pyuria, voiding/incontinence  Issues) syncope, focal weakness, memory loss,numbness & tingling, skin/hair/nail changes,depression, anxiety, abnormal bruising/bleeding, musculoskeletal symptoms/signs.     Objective:   Physical Exam BP 132/84  Ht 5\' 10"  (1.778 m)  Wt 200 lb 3.2 oz (90.81 kg)  BMI 28.73 kg/m2  General Appearance:    Alert, cooperative, no distress, appears stated age  Head:    Normocephalic, without obvious abnormality, atraumatic  Eyes:    PERRL, conjunctiva/corneas clear, EOM's intact, fundi    benign, both eyes       Ears:    Normal TM's and external ear canals, both ears  Nose:   Nares normal, septum midline, mucosa normal, no drainage   or sinus tenderness  Throat:   Lips, mucosa, and tongue normal;  teeth and gums normal  Neck:   Supple, symmetrical, trachea midline, no adenopathy;       thyroid:  No enlargement/tenderness/nodules  Back:     Symmetric, no curvature, ROM normal, no CVA tenderness  Lungs:     Clear to auscultation bilaterally, respirations unlabored  Chest wall:    No tenderness or deformity  Heart:    Regular rate and rhythm, S1 and S2 normal, no murmur, rub   or gallop  Abdomen:     Soft, non-tender, bowel sounds active all four quadrants,    no masses, no organomegaly  Genitalia:    Deferred to Urology  Rectal:    Deferred to Urology  Extremities:   Extremities normal, atraumatic, no cyanosis or edema  Pulses:   2+ and symmetric all extremities  Skin:   Skin color, texture, turgor normal, no rashes or lesions  Lymph nodes:   Cervical, supraclavicular, and axillary nodes normal  Neurologic:   CNII-XII intact. Normal strength, sensation and reflexes      throughout          Assessment & Plan:

## 2011-01-04 ENCOUNTER — Other Ambulatory Visit: Payer: Self-pay | Admitting: Family Medicine

## 2011-01-04 DIAGNOSIS — E785 Hyperlipidemia, unspecified: Secondary | ICD-10-CM

## 2011-01-04 DIAGNOSIS — I4892 Unspecified atrial flutter: Secondary | ICD-10-CM

## 2011-01-04 DIAGNOSIS — Z0000 Encounter for general adult medical examination without abnormal findings: Secondary | ICD-10-CM

## 2011-01-05 ENCOUNTER — Other Ambulatory Visit (INDEPENDENT_AMBULATORY_CARE_PROVIDER_SITE_OTHER): Payer: Medicare Other

## 2011-01-05 DIAGNOSIS — I4892 Unspecified atrial flutter: Secondary | ICD-10-CM

## 2011-01-05 DIAGNOSIS — E785 Hyperlipidemia, unspecified: Secondary | ICD-10-CM

## 2011-01-05 DIAGNOSIS — Z0000 Encounter for general adult medical examination without abnormal findings: Secondary | ICD-10-CM

## 2011-01-05 LAB — POCT URINALYSIS DIPSTICK
Clarity, UA: NEGATIVE
Glucose, UA: NEGATIVE
Nitrite, UA: NEGATIVE
Spec Grav, UA: 1.02
Urobilinogen, UA: NEGATIVE

## 2011-01-05 LAB — CBC WITH DIFFERENTIAL/PLATELET
Basophils Absolute: 0 10*3/uL (ref 0.0–0.1)
Eosinophils Absolute: 0.1 10*3/uL (ref 0.0–0.7)
HCT: 40.4 % (ref 39.0–52.0)
Lymphs Abs: 1.7 10*3/uL (ref 0.7–4.0)
MCV: 96.9 fl (ref 78.0–100.0)
Monocytes Absolute: 0.6 10*3/uL (ref 0.1–1.0)
Neutro Abs: 3.4 10*3/uL (ref 1.4–7.7)
Platelets: 163 10*3/uL (ref 150.0–400.0)
RBC: 4.17 Mil/uL — ABNORMAL LOW (ref 4.22–5.81)
WBC: 5.9 10*3/uL (ref 4.5–10.5)

## 2011-01-05 LAB — HEPATIC FUNCTION PANEL
ALT: 17 U/L (ref 0–53)
Albumin: 3.9 g/dL (ref 3.5–5.2)
Bilirubin, Direct: 0.1 mg/dL (ref 0.0–0.3)
Total Protein: 6.8 g/dL (ref 6.0–8.3)

## 2011-01-05 LAB — BASIC METABOLIC PANEL
BUN: 18 mg/dL (ref 6–23)
Calcium: 9.4 mg/dL (ref 8.4–10.5)
Creatinine, Ser: 0.8 mg/dL (ref 0.4–1.5)
GFR: 94.21 mL/min (ref >60.00)
Glucose, Bld: 95 mg/dL (ref 70–99)

## 2011-01-05 LAB — LIPID PANEL
Cholesterol: 198 mg/dL (ref 0–200)
LDL Cholesterol: 131 mg/dL — ABNORMAL HIGH (ref 0–99)
Triglycerides: 131 mg/dL (ref 0.0–149.0)
VLDL: 26.2 mg/dL (ref 0.0–40.0)

## 2011-01-05 LAB — TSH: TSH: 2.7 u[IU]/mL (ref 0.35–5.50)

## 2011-01-05 NOTE — Progress Notes (Signed)
Labs only

## 2011-01-11 NOTE — Assessment & Plan Note (Signed)
Heart is regular today.  Has had no problems since ablation.

## 2011-01-11 NOTE — Assessment & Plan Note (Signed)
On fish oil and controlling w/ diet.  Due for labs.  Start statin prn.

## 2011-01-11 NOTE — Assessment & Plan Note (Signed)
Pt's PE WNL.  Check labs.  UTD on health maintenance.  Anticipatory guidance provided. 

## 2011-07-06 ENCOUNTER — Ambulatory Visit: Payer: Medicare Other | Admitting: Family Medicine

## 2011-07-07 ENCOUNTER — Ambulatory Visit (INDEPENDENT_AMBULATORY_CARE_PROVIDER_SITE_OTHER): Payer: Medicare Other | Admitting: Family Medicine

## 2011-07-07 ENCOUNTER — Encounter: Payer: Self-pay | Admitting: Family Medicine

## 2011-07-07 VITALS — BP 122/80 | HR 99 | Temp 98.6°F | Ht 68.75 in | Wt 200.6 lb

## 2011-07-07 DIAGNOSIS — E785 Hyperlipidemia, unspecified: Secondary | ICD-10-CM | POA: Diagnosis not present

## 2011-07-07 DIAGNOSIS — I4892 Unspecified atrial flutter: Secondary | ICD-10-CM

## 2011-07-07 LAB — BASIC METABOLIC PANEL
BUN: 17 mg/dL (ref 6–23)
CO2: 25 mEq/L (ref 19–32)
Calcium: 9.8 mg/dL (ref 8.4–10.5)
GFR: 75.89 mL/min (ref >60.00)
Glucose, Bld: 99 mg/dL (ref 70–99)
Potassium: 4.4 mEq/L (ref 3.5–5.1)
Sodium: 143 mEq/L (ref 135–145)

## 2011-07-07 LAB — HEPATIC FUNCTION PANEL
Albumin: 4.2 g/dL (ref 3.5–5.2)
Alkaline Phosphatase: 79 U/L (ref 39–117)
Total Protein: 7.1 g/dL (ref 6.0–8.3)

## 2011-07-07 LAB — LIPID PANEL: Total CHOL/HDL Ratio: 5

## 2011-07-07 NOTE — Assessment & Plan Note (Signed)
Normal rhythm today.  Asymptomatic since ablation.  No longer following w/ cards.  Will continue to follow.

## 2011-07-07 NOTE — Assessment & Plan Note (Signed)
Chronic problem.  Attempting to control w/ healthy diet and exercise.  Check labs.  Start meds prn.

## 2011-07-07 NOTE — Patient Instructions (Signed)
Schedule your complete physical in 6 months We'll notify you of your lab results Keep up the good work!  You look great! Call with any questions or concerns Happy Early Birthday!!!

## 2011-07-07 NOTE — Progress Notes (Signed)
  Subjective:    Patient ID: Trevor Galloway, male    DOB: 1927-03-28, 78 y.o.   MRN: [id number]  HPI Hyperlipidmia- chronic problem, is not taking any prescribed meds and has stopped the fish oil capsules.  Minimal exercise.  Not following particular diet  Afib- no episodes of palpitations since ablation in 8/11.  No CP, SOB, edema.   Review of Systems For ROS see HPI     Objective:   Physical Exam  Vitals reviewed. Constitutional: He is oriented to person, place, and time. He appears well-developed and well-nourished. No distress.  HENT:  Head: Normocephalic and atraumatic.  Eyes: Conjunctivae and EOM are normal. Pupils are equal, round, and reactive to light.  Neck: Normal range of motion. Neck supple. No thyromegaly present.  Cardiovascular: Normal rate, regular rhythm, normal heart sounds and intact distal pulses.   No murmur heard. Pulmonary/Chest: Effort normal and breath sounds normal. No respiratory distress.  Abdominal: Soft. Bowel sounds are normal. He exhibits no distension.  Musculoskeletal: He exhibits no edema.  Lymphadenopathy:    He has no cervical adenopathy.  Neurological: He is alert and oriented to person, place, and time. No cranial nerve deficit.  Skin: Skin is warm and dry.  Psychiatric: He has a normal mood and affect. His behavior is normal.          Assessment & Plan:

## 2011-07-08 MED ORDER — ATORVASTATIN CALCIUM 10 MG PO TABS: 10.0000 mg | ORAL_TABLET | Freq: Every day | ORAL | Status: DC

## 2011-07-08 NOTE — Progress Notes (Signed)
Addended by: Derry Lory A on: 07/08/2011 11:10 AM   Modules accepted: Orders

## 2011-08-26 ENCOUNTER — Other Ambulatory Visit (INDEPENDENT_AMBULATORY_CARE_PROVIDER_SITE_OTHER): Payer: Medicare Other

## 2011-08-26 DIAGNOSIS — E785 Hyperlipidemia, unspecified: Secondary | ICD-10-CM

## 2011-08-26 LAB — HEPATIC FUNCTION PANEL
ALT: 14 U/L (ref 0–53)
AST: 21 U/L (ref 0–37)
Alkaline Phosphatase: 73 U/L (ref 39–117)
Bilirubin, Direct: 0.1 mg/dL (ref 0.0–0.3)
Total Protein: 6.7 g/dL (ref 6.0–8.3)

## 2011-08-29 ENCOUNTER — Encounter: Payer: Self-pay | Admitting: *Deleted

## 2011-09-28 DIAGNOSIS — D235 Other benign neoplasm of skin of trunk: Secondary | ICD-10-CM | POA: Diagnosis not present

## 2011-09-28 DIAGNOSIS — Z85828 Personal history of other malignant neoplasm of skin: Secondary | ICD-10-CM | POA: Diagnosis not present

## 2011-11-22 DIAGNOSIS — C61 Malignant neoplasm of prostate: Secondary | ICD-10-CM | POA: Diagnosis not present

## 2011-12-16 DIAGNOSIS — H3530 Unspecified macular degeneration: Secondary | ICD-10-CM | POA: Diagnosis not present

## 2011-12-16 DIAGNOSIS — H35379 Puckering of macula, unspecified eye: Secondary | ICD-10-CM | POA: Diagnosis not present

## 2011-12-16 DIAGNOSIS — Z961 Presence of intraocular lens: Secondary | ICD-10-CM | POA: Diagnosis not present

## 2011-12-16 DIAGNOSIS — H52209 Unspecified astigmatism, unspecified eye: Secondary | ICD-10-CM | POA: Diagnosis not present

## 2012-01-04 ENCOUNTER — Encounter: Payer: Self-pay | Admitting: Family Medicine

## 2012-01-04 ENCOUNTER — Ambulatory Visit (INDEPENDENT_AMBULATORY_CARE_PROVIDER_SITE_OTHER): Payer: Medicare Other | Admitting: Family Medicine

## 2012-01-04 VITALS — BP 124/76 | HR 89 | Temp 97.8°F | Ht 69.5 in | Wt 200.6 lb

## 2012-01-04 DIAGNOSIS — I4892 Unspecified atrial flutter: Secondary | ICD-10-CM | POA: Diagnosis not present

## 2012-01-04 DIAGNOSIS — E785 Hyperlipidemia, unspecified: Secondary | ICD-10-CM | POA: Diagnosis not present

## 2012-01-04 DIAGNOSIS — Z0000 Encounter for general adult medical examination without abnormal findings: Secondary | ICD-10-CM

## 2012-01-04 LAB — CBC WITH DIFFERENTIAL/PLATELET
Basophils Absolute: 0.1 10*3/uL (ref 0.0–0.1)
Eosinophils Absolute: 0.1 10*3/uL (ref 0.0–0.7)
Hemoglobin: 13.9 g/dL (ref 13.0–17.0)
Lymphocytes Relative: 24.7 % (ref 12.0–46.0)
MCHC: 33.3 g/dL (ref 30.0–36.0)
Neutro Abs: 4.7 10*3/uL (ref 1.4–7.7)
Platelets: 180 10*3/uL (ref 150.0–400.0)
RDW: 14.4 % (ref 11.5–14.6)

## 2012-01-04 LAB — LIPID PANEL
Cholesterol: 204 mg/dL — ABNORMAL HIGH (ref 0–200)
Total CHOL/HDL Ratio: 5
Triglycerides: 191 mg/dL — ABNORMAL HIGH (ref 0.0–149.0)
VLDL: 38.2 mg/dL (ref 0.0–40.0)

## 2012-01-04 LAB — BASIC METABOLIC PANEL
Calcium: 9.6 mg/dL (ref 8.4–10.5)
Chloride: 107 mEq/L (ref 96–112)
Creatinine, Ser: 0.8 mg/dL (ref 0.4–1.5)

## 2012-01-04 LAB — HEPATIC FUNCTION PANEL
ALT: 18 U/L (ref 0–53)
Bilirubin, Direct: 0.1 mg/dL (ref 0.0–0.3)
Total Bilirubin: 0.7 mg/dL (ref 0.3–1.2)

## 2012-01-04 NOTE — Assessment & Plan Note (Signed)
Pt w/ regular S1/S2 today.  Remains asymptomatic since ablation

## 2012-01-04 NOTE — Assessment & Plan Note (Signed)
Pt's PE WNL.  UTD on urology, declines colonoscopy at this time.  Check labs.  Anticipatory guidance provided.

## 2012-01-04 NOTE — Progress Notes (Signed)
  Subjective:    Patient ID: Trevor Galloway, male    DOB: 01/07/1927, 78 y.o.   MRN: [id number]  HPI Here today for CPE.  Risk Factors: Hyperlipidemia- chronic problem, on Lipitor.  Denies abd pain, N/V, myalgias Physical Activity: was previously doing a lot of gardening but not recently Fall Risk: low risk, steady on feet. Depression: asymptomatic Hearing: normal to conversational tones, mildly decreased to whispered voice ADL's: independent Cognitive: normal linear thought process, memory and attention intact Home Safety: safe at home, lives w/ wife Height, Weight, BMI, Visual Acuity: see vitals, vision corrected to 20/20 w/ glasses Counseling: UTD on Urology (Dr Isabel Caprice), had sigmoidoscopy 4-5 yrs ago w/ Dr Clovis Riley- not interested in colonoscopy. Labs Ordered: See A&P Care Plan: See A&P    Review of Systems Patient reports no vision/hearing changes, anorexia, fever ,adenopathy, persistant/recurrent hoarseness, swallowing issues, chest pain, palpitations, edema, persistant/recurrent cough, hemoptysis, dyspnea (rest,exertional, paroxysmal nocturnal), gastrointestinal  bleeding (melena, rectal bleeding), abdominal pain, excessive heart burn, GU symptoms (dysuria, hematuria, voiding/incontinence issues) syncope, focal weakness, memory loss, numbness & tingling, skin/hair/nail changes, depression, anxiety, abnormal bruising/bleeding, musculoskeletal symptoms/signs.     Objective:   Physical Exam BP 124/76  Pulse 89  Temp 97.8 F (36.6 C) (Oral)  Ht 5' 9.5" (1.765 m)  Wt 200 lb 9.6 oz (90.992 kg)  BMI 29.20 kg/m2  SpO2 95%  General Appearance:    Alert, cooperative, no distress, appears stated age  Head:    Normocephalic, without obvious abnormality, atraumatic  Eyes:    PERRL, conjunctiva/corneas clear, EOM's intact, fundi    benign, both eyes       Ears:    Normal TM's and external ear canals, both ears  Nose:   Nares normal, septum midline, mucosa normal, no drainage   or  sinus tenderness  Throat:   Lips, mucosa, and tongue normal; teeth and gums normal  Neck:   Supple, symmetrical, trachea midline, no adenopathy;       thyroid:  No enlargement/tenderness/nodules  Back:     Symmetric, no curvature, ROM normal, no CVA tenderness  Lungs:     Clear to auscultation bilaterally, respirations unlabored  Chest wall:    No tenderness or deformity  Heart:    Regular rate and rhythm, S1 and S2 normal, no murmur, rub   or gallop  Abdomen:     Soft, non-tender, bowel sounds active all four quadrants,    no masses, no organomegaly  Genitalia:    Deferred to urology  Rectal:    Extremities:   Extremities normal, atraumatic, no cyanosis or edema  Pulses:   2+ and symmetric all extremities  Skin:   Skin color, texture, turgor normal, no rashes or lesions  Lymph nodes:   Cervical, supraclavicular, and axillary nodes normal  Neurologic:   CNII-XII intact. Normal strength, sensation and reflexes      throughout          Assessment & Plan:

## 2012-01-04 NOTE — Assessment & Plan Note (Signed)
Chronic problem, tolerating statin w/out difficulty.  Check labs.  Adjust meds prn  

## 2012-01-04 NOTE — Patient Instructions (Addendum)
Follow up in 6 months to recheck cholesterol We'll notify you of your lab results and make any changes if needed Keep up the good work!  You look great! Call with any questions or concerns Enjoy what's left of your summer!!!

## 2012-01-21 DIAGNOSIS — Z23 Encounter for immunization: Secondary | ICD-10-CM | POA: Diagnosis not present

## 2012-01-31 ENCOUNTER — Emergency Department (HOSPITAL_BASED_OUTPATIENT_CLINIC_OR_DEPARTMENT_OTHER)
Admission: EM | Admit: 2012-01-31 | Discharge: 2012-02-01 | Disposition: A | Discharge status: Home / Self Care | Payer: Medicare Other | Attending: Emergency Medicine | Admitting: Emergency Medicine

## 2012-01-31 ENCOUNTER — Encounter (HOSPITAL_BASED_OUTPATIENT_CLINIC_OR_DEPARTMENT_OTHER): Payer: Self-pay | Admitting: *Deleted

## 2012-01-31 ENCOUNTER — Emergency Department (HOSPITAL_BASED_OUTPATIENT_CLINIC_OR_DEPARTMENT_OTHER): Payer: Medicare Other

## 2012-01-31 DIAGNOSIS — Z87891 Personal history of nicotine dependence: Secondary | ICD-10-CM | POA: Diagnosis not present

## 2012-01-31 DIAGNOSIS — Z8546 Personal history of malignant neoplasm of prostate: Secondary | ICD-10-CM | POA: Diagnosis not present

## 2012-01-31 DIAGNOSIS — IMO0002 Reserved for concepts with insufficient information to code with codable children: Secondary | ICD-10-CM | POA: Insufficient documentation

## 2012-01-31 DIAGNOSIS — E785 Hyperlipidemia, unspecified: Secondary | ICD-10-CM | POA: Insufficient documentation

## 2012-01-31 DIAGNOSIS — M47817 Spondylosis without myelopathy or radiculopathy, lumbosacral region: Secondary | ICD-10-CM | POA: Diagnosis not present

## 2012-01-31 DIAGNOSIS — W108XXA Fall (on) (from) other stairs and steps, initial encounter: Secondary | ICD-10-CM | POA: Insufficient documentation

## 2012-01-31 DIAGNOSIS — S79929A Unspecified injury of unspecified thigh, initial encounter: Secondary | ICD-10-CM | POA: Insufficient documentation

## 2012-01-31 DIAGNOSIS — S4980XA Other specified injuries of shoulder and upper arm, unspecified arm, initial encounter: Secondary | ICD-10-CM | POA: Insufficient documentation

## 2012-01-31 DIAGNOSIS — M25519 Pain in unspecified shoulder: Secondary | ICD-10-CM | POA: Diagnosis not present

## 2012-01-31 DIAGNOSIS — S7000XA Contusion of unspecified hip, initial encounter: Secondary | ICD-10-CM | POA: Diagnosis not present

## 2012-01-31 DIAGNOSIS — S46909A Unspecified injury of unspecified muscle, fascia and tendon at shoulder and upper arm level, unspecified arm, initial encounter: Secondary | ICD-10-CM | POA: Insufficient documentation

## 2012-01-31 DIAGNOSIS — Y92009 Unspecified place in unspecified non-institutional (private) residence as the place of occurrence of the external cause: Secondary | ICD-10-CM | POA: Insufficient documentation

## 2012-01-31 DIAGNOSIS — S4991XA Unspecified injury of right shoulder and upper arm, initial encounter: Secondary | ICD-10-CM

## 2012-01-31 DIAGNOSIS — S79919A Unspecified injury of unspecified hip, initial encounter: Secondary | ICD-10-CM | POA: Insufficient documentation

## 2012-01-31 DIAGNOSIS — S7001XA Contusion of right hip, initial encounter: Secondary | ICD-10-CM

## 2012-01-31 DIAGNOSIS — Z043 Encounter for examination and observation following other accident: Secondary | ICD-10-CM | POA: Diagnosis not present

## 2012-01-31 DIAGNOSIS — M25559 Pain in unspecified hip: Secondary | ICD-10-CM | POA: Diagnosis not present

## 2012-01-31 DIAGNOSIS — Z23 Encounter for immunization: Secondary | ICD-10-CM | POA: Insufficient documentation

## 2012-01-31 DIAGNOSIS — M25529 Pain in unspecified elbow: Secondary | ICD-10-CM | POA: Diagnosis not present

## 2012-01-31 DIAGNOSIS — S50311A Abrasion of right elbow, initial encounter: Secondary | ICD-10-CM

## 2012-01-31 MED ORDER — TETANUS-DIPHTH-ACELL PERTUSSIS 5-2.5-18.5 LF-MCG/0.5 IM SUSP
0.5000 mL | Freq: Once | INTRAMUSCULAR | Status: AC
  Administered 2012-01-31: 0.5 mL via INTRAMUSCULAR
  Filled 2012-01-31: qty 0.5

## 2012-01-31 MED ORDER — HYDROCODONE-ACETAMINOPHEN 5-325 MG PO TABS: 0.5000 | ORAL_TABLET | ORAL | Status: AC | PRN

## 2012-01-31 NOTE — ED Provider Notes (Signed)
History     CSN: [id number]  Arrival date & time 01/31/12  2104   First MD Initiated Contact with Patient 01/31/12 2259      Chief Complaint  Patient presents with  . Fall    (Consider location/radiation/quality/duration/timing/severity/associated sxs/prior treatment) HPI This is an 78 year old white male who fell down steps at home this evening about 8 PM. He is having moderate pain in his right shoulder, worse with attempted movement, with lesser pain in his right elbow and right hip. He is able to bear weight on his right hip. There is an abrasion to his right elbow. He is having difficulty moving his right shoulder due to pain. He denies neck injury. He denies loss of consciousness. He cannot recall his last tetanus booster.  Past Medical History  Diagnosis Date  . Cancer     Prostate cancer  . Family hx colonic polyps   . RBBB (right bundle branch block)   . Atrial flutter     s/p CTI ablation 8/11  . Hiatal hernia   . Hyperlipidemia     Past Surgical History  Procedure Date  . Prostate surgery     seed implant  . Eye surgery     bilateral cataracts  . Hernia repair     Family History  Problem Relation Age of Onset  . Parkinsonism Sister   . Parkinsonism Brother   . Cancer Brother     prostate cancer    History  Substance Use Topics  . Smoking status: Former Games developer  . Smokeless tobacco: Not on file  . Alcohol Use: Yes     seldomly. 1 beer      Review of Systems  All other systems reviewed and are negative.    Allergies  Review of patient's allergies indicates no known allergies.  Home Medications   Current Outpatient Rx  Name Route Sig Dispense Refill  . ASPIRIN 325 MG PO TABS Oral Take 325 mg by mouth daily.      . OCUVITE PO TABS Oral Take 1 tablet by mouth daily.      BP 167/94  Pulse 89  Temp 97.8 F (36.6 C) (Oral)  Resp 20  SpO2 96%  Physical Exam General: Well-developed, well-nourished male in no acute distress; appearance  consistent with age of record HENT: normocephalic, atraumatic Eyes: pupils equal round and reactive to light; extraocular muscles intact Neck: supple; nontender Heart: regular rate and rhythm Lungs: Rales in bases Abdomen: soft; nondistended; nontender; nontender, reducible ventral hernia Extremities: No deformity; decreased range of motion of right shoulder due to pain; tenderness of right elbow with abrasion; tenderness of her right greater trochanter; +1 edema of lower leg Neurologic: Awake, alert and oriented; motor function intact in all extremities and symmetric; no facial droop Skin: Warm and dry Psychiatric: Normal mood and affect    ED Course  Procedures (including critical care time)  Labs Reviewed - No data to display Dg Shoulder Right  01/31/2012  *RADIOLOGY REPORT*  Clinical Data: Right shoulder pain after falling down stairs.  RIGHT SHOULDER - 2+ VIEW  Comparison: None.  Findings: There is no visible glenohumeral fracture or dislocation. Degenerative changes noted at the acromioclavicular joint.  There is no fracture of the adjacent ribs are clavicle.  Lung granulomas are seen.  IMPRESSION: No acute glenohumeral abnormality.   Original Report Authenticated By: Elsie Stain, M.D.    Dg Elbow Complete Right  01/31/2012  *RADIOLOGY REPORT*  Clinical Data: Pain and bleeding after a  fall.  RIGHT ELBOW - COMPLETE 3+ VIEW  Comparison: None.  Findings: There is no visible elbow fracture or joint effusion. There is no dislocation. No focal osseous abnormality is seen. Small nonspecific soft tissue calcification in the antecubital fossa.  IMPRESSION: No visible acute abnormality.   Original Report Authenticated By: Elsie Stain, M.D.    Dg Hip Complete Right  01/31/2012  *RADIOLOGY REPORT*  Clinical Data: Right hip pain after falling down stairs  RIGHT HIP - COMPLETE 2+ VIEW  Comparison: None.  Findings: Prostatic seeds.  No hip fracture.  No pelvic fracture or diastasis.   Phleboliths.  No significant hip joint space narrowing. Lower lumbar spondylosis.  IMPRESSION: No acute findings.   Original Report Authenticated By: Elsie Stain, M.D.     MDM          Hanley Seamen, MD 01/31/12 [phone]

## 2012-01-31 NOTE — ED Notes (Signed)
Fall. Lost his balance and fell. Landed on his right side. Abrasion to his right elbow. Shoulder and hip pain.

## 2012-01-31 NOTE — ED Notes (Signed)
Pt. Is unable to move the R shoulder due to his fall.  No noted deformity

## 2012-02-06 DIAGNOSIS — S40019A Contusion of unspecified shoulder, initial encounter: Secondary | ICD-10-CM | POA: Diagnosis not present

## 2012-02-06 DIAGNOSIS — S7000XA Contusion of unspecified hip, initial encounter: Secondary | ICD-10-CM | POA: Diagnosis not present

## 2012-02-14 DIAGNOSIS — M25519 Pain in unspecified shoulder: Secondary | ICD-10-CM | POA: Diagnosis not present

## 2012-02-14 DIAGNOSIS — M75120 Complete rotator cuff tear or rupture of unspecified shoulder, not specified as traumatic: Secondary | ICD-10-CM | POA: Diagnosis not present

## 2012-02-24 DIAGNOSIS — M19019 Primary osteoarthritis, unspecified shoulder: Secondary | ICD-10-CM | POA: Diagnosis not present

## 2012-02-29 DIAGNOSIS — D485 Neoplasm of uncertain behavior of skin: Secondary | ICD-10-CM | POA: Diagnosis not present

## 2012-03-01 DIAGNOSIS — M66329 Spontaneous rupture of flexor tendons, unspecified upper arm: Secondary | ICD-10-CM | POA: Diagnosis not present

## 2012-03-01 DIAGNOSIS — M75120 Complete rotator cuff tear or rupture of unspecified shoulder, not specified as traumatic: Secondary | ICD-10-CM | POA: Diagnosis not present

## 2012-03-22 DIAGNOSIS — M25569 Pain in unspecified knee: Secondary | ICD-10-CM | POA: Diagnosis not present

## 2012-03-22 DIAGNOSIS — M75120 Complete rotator cuff tear or rupture of unspecified shoulder, not specified as traumatic: Secondary | ICD-10-CM | POA: Diagnosis not present

## 2012-05-01 DIAGNOSIS — M1710 Unilateral primary osteoarthritis, unspecified knee: Secondary | ICD-10-CM | POA: Diagnosis not present

## 2012-05-01 DIAGNOSIS — M67919 Unspecified disorder of synovium and tendon, unspecified shoulder: Secondary | ICD-10-CM | POA: Diagnosis not present

## 2012-05-01 DIAGNOSIS — M719 Bursopathy, unspecified: Secondary | ICD-10-CM | POA: Diagnosis not present

## 2012-05-17 ENCOUNTER — Observation Stay (HOSPITAL_COMMUNITY)
Admission: EM | Admit: 2012-05-17 | Discharge: 2012-05-19 | Disposition: A | Discharge status: Home / Self Care | Payer: Medicare Other | Attending: Internal Medicine | Admitting: Internal Medicine

## 2012-05-17 ENCOUNTER — Encounter (HOSPITAL_COMMUNITY): Payer: Self-pay | Admitting: Emergency Medicine

## 2012-05-17 ENCOUNTER — Emergency Department (HOSPITAL_COMMUNITY): Payer: Medicare Other

## 2012-05-17 DIAGNOSIS — R269 Unspecified abnormalities of gait and mobility: Secondary | ICD-10-CM | POA: Insufficient documentation

## 2012-05-17 DIAGNOSIS — A419 Sepsis, unspecified organism: Secondary | ICD-10-CM | POA: Diagnosis not present

## 2012-05-17 DIAGNOSIS — M1990 Unspecified osteoarthritis, unspecified site: Secondary | ICD-10-CM | POA: Insufficient documentation

## 2012-05-17 DIAGNOSIS — E876 Hypokalemia: Secondary | ICD-10-CM | POA: Diagnosis not present

## 2012-05-17 DIAGNOSIS — E860 Dehydration: Secondary | ICD-10-CM | POA: Insufficient documentation

## 2012-05-17 DIAGNOSIS — J90 Pleural effusion, not elsewhere classified: Secondary | ICD-10-CM | POA: Diagnosis not present

## 2012-05-17 DIAGNOSIS — E782 Mixed hyperlipidemia: Secondary | ICD-10-CM | POA: Diagnosis present

## 2012-05-17 DIAGNOSIS — R509 Fever, unspecified: Secondary | ICD-10-CM | POA: Diagnosis not present

## 2012-05-17 DIAGNOSIS — R0602 Shortness of breath: Secondary | ICD-10-CM | POA: Diagnosis not present

## 2012-05-17 DIAGNOSIS — I4892 Unspecified atrial flutter: Secondary | ICD-10-CM | POA: Insufficient documentation

## 2012-05-17 DIAGNOSIS — C61 Malignant neoplasm of prostate: Secondary | ICD-10-CM | POA: Diagnosis not present

## 2012-05-17 DIAGNOSIS — I10 Essential (primary) hypertension: Secondary | ICD-10-CM

## 2012-05-17 DIAGNOSIS — R6510 Systemic inflammatory response syndrome (SIRS) of non-infectious origin without acute organ dysfunction: Secondary | ICD-10-CM

## 2012-05-17 DIAGNOSIS — R05 Cough: Secondary | ICD-10-CM | POA: Diagnosis not present

## 2012-05-17 DIAGNOSIS — R6889 Other general symptoms and signs: Secondary | ICD-10-CM

## 2012-05-17 DIAGNOSIS — J40 Bronchitis, not specified as acute or chronic: Principal | ICD-10-CM | POA: Insufficient documentation

## 2012-05-17 DIAGNOSIS — D72829 Elevated white blood cell count, unspecified: Secondary | ICD-10-CM | POA: Diagnosis not present

## 2012-05-17 DIAGNOSIS — E785 Hyperlipidemia, unspecified: Secondary | ICD-10-CM | POA: Diagnosis not present

## 2012-05-17 DIAGNOSIS — R5381 Other malaise: Secondary | ICD-10-CM | POA: Insufficient documentation

## 2012-05-17 DIAGNOSIS — R000 Tachycardia, unspecified: Secondary | ICD-10-CM | POA: Diagnosis not present

## 2012-05-17 LAB — CBC WITH DIFFERENTIAL/PLATELET
Eosinophils Relative: 0 % (ref 0–5)
HCT: 36 % — ABNORMAL LOW (ref 39.0–52.0)
Hemoglobin: 12.5 g/dL — ABNORMAL LOW (ref 13.0–17.0)
Lymphocytes Relative: 9 % — ABNORMAL LOW (ref 12–46)
Lymphs Abs: 1 10*3/uL (ref 0.7–4.0)
MCV: 93.3 fL (ref 78.0–100.0)
Monocytes Absolute: 1 10*3/uL (ref 0.1–1.0)
Monocytes Relative: 9 % (ref 3–12)
Platelets: 142 10*3/uL — ABNORMAL LOW (ref 150–400)
RBC: 3.86 MIL/uL — ABNORMAL LOW (ref 4.22–5.81)
WBC: 11.6 10*3/uL — ABNORMAL HIGH (ref 4.0–10.5)

## 2012-05-17 LAB — POCT I-STAT, CHEM 8
Chloride: 107 mEq/L (ref 96–112)
Glucose, Bld: 134 mg/dL — ABNORMAL HIGH (ref 70–99)
HCT: 37 % — ABNORMAL LOW (ref 39.0–52.0)
Potassium: 3.7 mEq/L (ref 3.5–5.1)

## 2012-05-17 LAB — URINALYSIS, ROUTINE W REFLEX MICROSCOPIC
Glucose, UA: NEGATIVE mg/dL
Ketones, ur: NEGATIVE mg/dL
Leukocytes, UA: NEGATIVE
pH: 5 (ref 5.0–8.0)

## 2012-05-17 MED ORDER — SODIUM CHLORIDE 0.9 % IV SOLN
Freq: Once | INTRAVENOUS | Status: AC
  Administered 2012-05-17: 20:00:00 via INTRAVENOUS

## 2012-05-17 MED ORDER — SODIUM CHLORIDE 0.9 % IV BOLUS (SEPSIS)
1000.0000 mL | Freq: Once | INTRAVENOUS | Status: AC
  Administered 2012-05-17: 1000 mL via INTRAVENOUS

## 2012-05-17 MED ORDER — OSELTAMIVIR PHOSPHATE 75 MG PO CAPS
75.0000 mg | ORAL_CAPSULE | Freq: Every day | ORAL | Status: DC
  Administered 2012-05-18: 75 mg via ORAL
  Filled 2012-05-17 (×2): qty 1

## 2012-05-17 NOTE — ED Provider Notes (Signed)
History     CSN: [id number]  Arrival date & time 05/17/12  1478   First MD Initiated Contact with Patient 05/17/12 2020      Chief Complaint  Patient presents with  . Fever  . Weakness    (Consider location/radiation/quality/duration/timing/severity/associated sxs/prior treatment) HPI Comments: Fever,  sore throat, cough for the past 3 days, today unable to ambulate independently, confused.  Has been taking OTC  Tylenol with some relief of the temperature   Patient is a 79 y.o. male presenting with fever and weakness. The history is provided by the spouse.  Fever Primary symptoms of the febrile illness include fever, cough, vomiting and altered mental status. Primary symptoms do not include headaches, shortness of breath, abdominal pain, nausea, diarrhea or myalgias. The current episode started yesterday. This is a new problem.  The fever began yesterday. The fever has been unchanged since its onset. The maximum temperature recorded prior to his arrival was unknown.  The cough began 3 to 5 days ago. The cough is productive. There is nondescript sputum produced.  The vomiting began today. Vomiting occurs 2 to 5 times per day.  The change in mental status began today. The altered mental status developed suddenly. The change in mental status includes confusion, incoordination and a gait disturbance.  Weakness The primary symptoms include altered mental status, fever and vomiting. Primary symptoms do not include headaches, seizures, dizziness or nausea.  Additional symptoms include weakness.    Past Medical History  Diagnosis Date  . Cancer     Prostate cancer  . Family hx colonic polyps   . RBBB (right bundle branch block)   . Atrial flutter     s/p CTI ablation 8/11  . Hiatal hernia   . Hyperlipidemia     Past Surgical History  Procedure Date  . Prostate surgery     seed implant  . Eye surgery     bilateral cataracts  . Hernia repair     Family History  Problem  Relation Age of Onset  . Parkinsonism Sister   . Parkinsonism Brother   . Cancer Brother     prostate cancer  . COPD Father     History  Substance Use Topics  . Smoking status: Former Games developer  . Smokeless tobacco: Not on file  . Alcohol Use: Yes     Comment: rare      Review of Systems  Constitutional: Positive for fever and activity change. Negative for chills.  HENT: Negative for congestion and rhinorrhea.   Eyes: Negative for visual disturbance.  Respiratory: Positive for cough. Negative for shortness of breath.   Cardiovascular: Negative for chest pain and leg swelling.  Gastrointestinal: Positive for vomiting. Negative for nausea, abdominal pain, diarrhea and constipation.  Genitourinary: Negative for frequency and decreased urine volume.  Musculoskeletal: Negative for myalgias.  Skin: Negative for color change.  Neurological: Positive for weakness. Negative for dizziness, seizures, speech difficulty and headaches.  Psychiatric/Behavioral: Positive for altered mental status.    Allergies  Review of patient's allergies indicates no known allergies.  Home Medications   Current Outpatient Rx  Name  Route  Sig  Dispense  Refill  . ASPIRIN 325 MG PO TABS   Oral   Take 325 mg by mouth daily.           . OCUVITE PO TABS   Oral   Take 1 tablet by mouth daily.         Marland Kitchen DEXTROMETHORPHAN-BENZOCAINE 5-7.5 MG MT LOZG  Mouth/Throat   Use as directed 1 lozenge in the mouth or throat every 3 (three) hours as needed (sore throat and cough).   30 each   0   . GUAIFENESIN ER 600 MG PO TB12   Oral   Take 1 tablet (600 mg total) by mouth 2 (two) times daily.   30 tablet   0   . LEVOFLOXACIN 500 MG PO TABS   Oral   Take 1 tablet (500 mg total) by mouth daily.   5 tablet   0     BP 165/95  Pulse 91  Temp 99 F (37.2 C) (Oral)  Resp 18  Ht 5\' 8"  (1.727 m)  Wt 194 lb 3.6 oz (88.1 kg)  BMI 29.53 kg/m2  SpO2 95%  Physical Exam  Constitutional: He is  oriented to person, place, and time. He appears well-developed and well-nourished. No distress.  HENT:  Head: Normocephalic.  Eyes: Pupils are equal, round, and reactive to light.  Neck: Normal range of motion. Neck supple.  Cardiovascular: Tachycardia present.   Pulmonary/Chest: Effort normal and breath sounds normal. No respiratory distress. He has no wheezes. He exhibits no tenderness.  Abdominal: Soft. Bowel sounds are normal. He exhibits no distension. There is no tenderness.  Musculoskeletal: Normal range of motion. He exhibits no edema and no tenderness.  Lymphadenopathy:    He has no cervical adenopathy.  Neurological: He is alert and oriented to person, place, and time.  Skin: Skin is warm and dry. No erythema. No pallor.    ED Course  Procedures (including critical care time)  Labs Reviewed  CBC WITH DIFFERENTIAL - Abnormal; Notable for the following:    WBC 11.6 (*)     RBC 3.86 (*)     Hemoglobin 12.5 (*)     HCT 36.0 (*)     Platelets 142 (*)     Neutrophils Relative 82 (*)     Neutro Abs 9.5 (*)     Lymphocytes Relative 9 (*)     All other components within normal limits  URINALYSIS, ROUTINE W REFLEX MICROSCOPIC - Abnormal; Notable for the following:    APPearance CLOUDY (*)     All other components within normal limits  POCT I-STAT, CHEM 8 - Abnormal; Notable for the following:    Glucose, Bld 134 (*)     Hemoglobin 12.6 (*)     HCT 37.0 (*)     All other components within normal limits  COMPREHENSIVE METABOLIC PANEL - Abnormal; Notable for the following:    Glucose, Bld 120 (*)     Total Protein 5.8 (*)     Albumin 3.0 (*)     GFR calc non Af Amer 89 (*)     All other components within normal limits  CBC - Abnormal; Notable for the following:    WBC 10.6 (*)     RBC 3.80 (*)     Hemoglobin 12.3 (*)     HCT 35.4 (*)     Platelets 144 (*)     All other components within normal limits  BASIC METABOLIC PANEL - Abnormal; Notable for the following:     Potassium 3.3 (*)     Glucose, Bld 172 (*)     GFR calc non Af Amer 86 (*)     All other components within normal limits  CBC - Abnormal; Notable for the following:    RBC 3.57 (*)     Hemoglobin 11.5 (*)     HCT  33.2 (*)     Platelets 131 (*)     All other components within normal limits  RAPID STREP SCREEN  LACTIC ACID, PLASMA  INFLUENZA PANEL BY PCR  CULTURE, BLOOD (ROUTINE X 2)  CULTURE, BLOOD (ROUTINE X 2)  MAGNESIUM  PHOSPHORUS  TSH   Dg Chest 2 View  05/19/2012  *RADIOLOGY REPORT*  Clinical Data: Short of breath.  Cough.  Weakness.  Prostate carcinoma.  CHEST - 2 VIEW  Comparison: 05/17/2012  Findings: Improved aeration of both lungs is seen.  Both lungs are clear. Tiny right posterior pleural effusion versus pleural thickening noted.  Tiny calcified granulomata are seen bilaterally. Heart size is within normal limits.  Ectasia of the thoracic aorta remains stable. No definite mass or lymphadenopathy identified.  IMPRESSION: Resolving bibasilar atelectasis. Tiny right posterior pleural effusion versus pleural thickening.   Original Report Authenticated By: Myles Rosenthal, M.D.      1. Febrile illness, acute   2. Dehydration   3. Tachycardia, unspecified   4. SIRS (systemic inflammatory response syndrome)   5. Atrial flutter   6. Flu-like symptoms   7. Leukocytosis   8. Physical deconditioning   9. HTN (hypertension)   10. Bronchitis       MDM          Arman Filter, NP 05/20/12 0009

## 2012-05-17 NOTE — ED Provider Notes (Signed)
Complains of fever and generalized weakness onset yesterday. Patient also complains of cough, nonproductive and slight sore throat. Patient is alert nontoxic lungs clear auscultation heart regular rate and rhythm abdomen nondistended nontender. Suspect influenza-like illness  Doug Sou, MD 05/17/12 [phone]

## 2012-05-17 NOTE — ED Notes (Signed)
[id number]<YS> Expected date:<BR> Expected time:<BR> Means of arrival:Ambulance<BR> Comments:<BR> 84yom- lethargic, fever 102.8

## 2012-05-17 NOTE — ED Notes (Signed)
PER EMS- pt picked up from home with c/o fever 102.8 x3 days and weakness x2hours. Given 1000mg  tylenol PO and 20g LAC.

## 2012-05-18 ENCOUNTER — Encounter (HOSPITAL_COMMUNITY): Payer: Self-pay | Admitting: Internal Medicine

## 2012-05-18 DIAGNOSIS — R509 Fever, unspecified: Secondary | ICD-10-CM

## 2012-05-18 DIAGNOSIS — R000 Tachycardia, unspecified: Secondary | ICD-10-CM | POA: Diagnosis not present

## 2012-05-18 DIAGNOSIS — E860 Dehydration: Secondary | ICD-10-CM | POA: Diagnosis present

## 2012-05-18 DIAGNOSIS — R6889 Other general symptoms and signs: Secondary | ICD-10-CM | POA: Diagnosis present

## 2012-05-18 DIAGNOSIS — R6510 Systemic inflammatory response syndrome (SIRS) of non-infectious origin without acute organ dysfunction: Secondary | ICD-10-CM

## 2012-05-18 LAB — CBC
HCT: 35.4 % — ABNORMAL LOW (ref 39.0–52.0)
MCHC: 34.7 g/dL (ref 30.0–36.0)
MCV: 93.2 fL (ref 78.0–100.0)
Platelets: 144 10*3/uL — ABNORMAL LOW (ref 150–400)
RDW: 14.3 % (ref 11.5–15.5)
WBC: 10.6 10*3/uL — ABNORMAL HIGH (ref 4.0–10.5)

## 2012-05-18 LAB — COMPREHENSIVE METABOLIC PANEL
ALT: 19 U/L (ref 0–53)
Albumin: 3 g/dL — ABNORMAL LOW (ref 3.5–5.2)
Alkaline Phosphatase: 78 U/L (ref 39–117)
BUN: 18 mg/dL (ref 6–23)
Chloride: 103 mEq/L (ref 96–112)
Glucose, Bld: 120 mg/dL — ABNORMAL HIGH (ref 70–99)
Potassium: 3.5 mEq/L (ref 3.5–5.1)
Sodium: 135 mEq/L (ref 135–145)
Total Bilirubin: 0.5 mg/dL (ref 0.3–1.2)
Total Protein: 5.8 g/dL — ABNORMAL LOW (ref 6.0–8.3)

## 2012-05-18 LAB — LACTIC ACID, PLASMA: Lactic Acid, Venous: 1.2 mmol/L (ref 0.5–2.2)

## 2012-05-18 LAB — INFLUENZA PANEL BY PCR (TYPE A & B)
H1N1 flu by pcr: NOT DETECTED
Influenza B By PCR: NEGATIVE

## 2012-05-18 LAB — BASIC METABOLIC PANEL
Chloride: 105 mEq/L (ref 96–112)
Creatinine, Ser: 0.67 mg/dL (ref 0.50–1.35)
GFR calc Af Amer: >90 mL/min (ref >90)
GFR calc non Af Amer: 86 mL/min — ABNORMAL LOW (ref >90)

## 2012-05-18 MED ORDER — HYDROCODONE-ACETAMINOPHEN 5-325 MG PO TABS: 1.0000 | ORAL_TABLET | ORAL | Status: DC | PRN

## 2012-05-18 MED ORDER — SODIUM CHLORIDE 0.9 % IV SOLN
INTRAVENOUS | Status: AC
  Administered 2012-05-18: 03:00:00 via INTRAVENOUS

## 2012-05-18 MED ORDER — DOCUSATE SODIUM 100 MG PO CAPS
100.0000 mg | ORAL_CAPSULE | Freq: Two times a day (BID) | ORAL | Status: DC
  Administered 2012-05-18 – 2012-05-19 (×3): 100 mg via ORAL
  Filled 2012-05-18 (×4): qty 1

## 2012-05-18 MED ORDER — ASPIRIN 325 MG PO TABS
325.0000 mg | ORAL_TABLET | Freq: Every day | ORAL | Status: DC
  Administered 2012-05-18 – 2012-05-19 (×2): 325 mg via ORAL
  Filled 2012-05-18 (×2): qty 1

## 2012-05-18 MED ORDER — ONDANSETRON HCL 4 MG PO TABS: 4.0000 mg | ORAL_TABLET | Freq: Four times a day (QID) | ORAL | Status: DC | PRN

## 2012-05-18 MED ORDER — LEVALBUTEROL HCL 0.63 MG/3ML IN NEBU
0.6300 mg | INHALATION_SOLUTION | RESPIRATORY_TRACT | Status: DC | PRN
  Filled 2012-05-18: qty 3

## 2012-05-18 MED ORDER — ENOXAPARIN SODIUM 40 MG/0.4ML ~~LOC~~ SOLN
40.0000 mg | SUBCUTANEOUS | Status: DC
  Administered 2012-05-18: 40 mg via SUBCUTANEOUS
  Filled 2012-05-18 (×2): qty 0.4

## 2012-05-18 MED ORDER — ACETAMINOPHEN 325 MG PO TABS: 650.0000 mg | ORAL_TABLET | Freq: Four times a day (QID) | ORAL | Status: DC | PRN

## 2012-05-18 MED ORDER — ONDANSETRON HCL 4 MG/2ML IJ SOLN: 4.0000 mg | Freq: Four times a day (QID) | INTRAMUSCULAR | Status: DC | PRN

## 2012-05-18 MED ORDER — GUAIFENESIN ER 600 MG PO TB12
600.0000 mg | ORAL_TABLET | Freq: Two times a day (BID) | ORAL | Status: DC
  Administered 2012-05-18 – 2012-05-19 (×3): 600 mg via ORAL
  Filled 2012-05-18 (×4): qty 1

## 2012-05-18 MED ORDER — ACETAMINOPHEN 650 MG RE SUPP: 650.0000 mg | Freq: Four times a day (QID) | RECTAL | Status: DC | PRN

## 2012-05-18 MED ORDER — SODIUM CHLORIDE 0.9 % IJ SOLN
3.0000 mL | Freq: Two times a day (BID) | INTRAMUSCULAR | Status: DC
  Administered 2012-05-18 (×2): 3 mL via INTRAVENOUS

## 2012-05-18 NOTE — Evaluation (Signed)
Physical Therapy Evaluation Patient Details Name: Trevor Galloway MRN: [id number] DOB: 1926/11/04 Today's Date: 05/18/2012 Time: 4098-1191 PT Time Calculation (min): 15 min  PT Assessment / Plan / Recommendation Clinical Impression  Pt admitted with fatigue and fever.  Pt would benefit from acute PT services in order to improve independence with transfers and ambulation as well as safety awareness to prepare for d/c home with spouse.  Recommended pt have more than spouse assist at home if to d/c today and to have RW and SPC brought to house prior to d/c.  Pt unaware of unsteady and unsafe gait until sitting in chair brough behind him for safety stating "I guess we should have turned around before."      PT Assessment  Patient needs continued PT services    Follow Up Recommendations  Home health PT;Supervision/Assistance - 24 hour (HHPT depending on progress, moderate assist level)    Does the patient have the potential to tolerate intense rehabilitation      Barriers to Discharge        Equipment Recommendations  None recommended by PT (if pt has family retrieve RW and SPC and bring to his house.)    Recommendations for Other Services     Frequency Min 3X/week    Precautions / Restrictions Precautions Precautions: Fall Restrictions Weight Bearing Restrictions: No   Pertinent Vitals/Pain No pain, denied dizziness with ambulation      Mobility  Bed Mobility Bed Mobility: Not assessed Transfers Transfers: Sit to Stand;Stand to Sit Sit to Stand: 4: Min assist;With upper extremity assist;From chair/3-in-1 Stand to Sit: 4: Min assist;With upper extremity assist;To chair/3-in-1 Details for Transfer Assistance: pt reports trouble standing from chair at home requiring multiple tries, verbal cues for safety, assist for weakness Ambulation/Gait Ambulation/Gait Assistance: 3: Mod assist Ambulation Distance (Feet): 120 Feet Assistive device: None Ambulation/Gait Assistance Details:  pt ambulating min/guard at first then increased speed and unable to slow with cues, also unsteady gait then observed and LEs buckling so had pt stop and stand and person bring chair behind pt to sit  (pt unaware of unsteadiness and need to stop and rest) Gait Pattern: Step-through pattern    Shoulder Instructions     Exercises     PT Diagnosis: Difficulty walking;Generalized weakness  PT Problem List: Decreased strength;Decreased activity tolerance;Decreased mobility;Decreased balance;Decreased safety awareness;Decreased knowledge of use of DME PT Treatment Interventions: DME instruction;Gait training;Stair training;Patient/family education;Functional mobility training;Therapeutic activities;Therapeutic exercise;Balance training;Neuromuscular re-education   PT Goals Acute Rehab PT Goals PT Goal Formulation: With patient Time For Goal Achievement: 05/25/12 Potential to Achieve Goals: Good Pt will go Sit to Stand: with modified independence PT Goal: Sit to Stand - Progress: Goal set today Pt will go Stand to Sit: with modified independence PT Goal: Stand to Sit - Progress: Goal set today Pt will Ambulate: 51 - 150 feet;with modified independence;with least restrictive assistive device PT Goal: Ambulate - Progress: Goal set today Pt will Perform Home Exercise Program: with supervision, verbal cues required/provided PT Goal: Perform Home Exercise Program - Progress: Goal set today  Visit Information  Last PT Received On: 05/18/12 Assistance Needed: +2 (safety)    Subjective Data  Subjective: What are we doing next?  (after sitting down in hallway due to unsteady gait)   Prior Functioning  Home Living Lives With: Spouse Type of Home: House Home Access: Stairs to enter [employer] of Steps: 3 Home Layout: One level Additional Comments: Pt states he has access to RW and [facility] however  they are not currently at home. Prior Function Level of Independence:  Independent Communication Communication: No difficulties    Cognition  Overall Cognitive Status: Impaired Area of Impairment: Safety/judgement Arousal/Alertness: Awake/alert Orientation Level: Appears intact for tasks assessed Behavior During Session: WFL for tasks performed Safety/Judgement: Decreased awareness of need for assistance    Extremity/Trunk Assessment Right Upper Extremity Assessment RUE ROM/Strength/Tone: Deficits RUE ROM/Strength/Tone Deficits: pt reports poor rotator cuff after fall 6 months ago ("MD wanted to operate but I told him no") Left Upper Extremity Assessment LUE ROM/Strength/Tone: WFL for tasks assessed Right Lower Extremity Assessment RLE ROM/Strength/Tone: Deficits RLE ROM/Strength/Tone Deficits: WFL with testing however functional weakness observed with buckling during gait Left Lower Extremity Assessment LLE ROM/Strength/Tone: Deficits LLE ROM/Strength/Tone Deficits: WFL with testing however functional weakness observed with buckling during gait   Balance    End of Session PT - End of Session Activity Tolerance: Patient limited by fatigue Patient left: in chair;with call bell/phone within reach;with chair alarm set Nurse Communication: Other (comment) (unsteady, unsafe gait)  GP Functional Assessment Tool Used: clinical judgement Functional Limitation: Mobility: Walking and moving around Mobility: Walking and Moving Around Current Status ([id number]): At least 40 percent but less than 60 percent impaired, limited or restricted Mobility: Walking and Moving Around Goal Status [phone]): At least 1 percent but less than 20 percent impaired, limited or restricted   Jaquille Kau,KATHrine E 05/18/2012, 12:04 PM Pager: [phone]

## 2012-05-18 NOTE — Evaluation (Signed)
Occupational Therapy Evaluation Patient Details Name: Trevor Galloway MRN: [id number] DOB: 1926-08-11 Today's Date: 05/18/2012 Time: 1310-1340 OT Time Calculation (min): 30 min  OT Assessment / Plan / Recommendation Clinical Impression  This 79 year old man was admitted for fever and fatique.  He was independent with adls at baseline and is now min A overall.  Pt would benefit from continued OT with supervision level goals in acute.      OT Assessment  Patient needs continued OT Services    Follow Up Recommendations  Home health OT    Barriers to Discharge      Equipment Recommendations  None recommended by OT    Recommendations for Other Services    Frequency  Min 2X/week    Precautions / Restrictions Precautions Precautions: Fall   Pertinent Vitals/Pain No c/o pain    ADL  Grooming: Performed;Supervision/safety Where Assessed - Grooming: Supported standing Upper Body Bathing: Simulated;Set up Where Assessed - Upper Body Bathing: Unsupported sitting Lower Body Bathing: Simulated;Minimal assistance Where Assessed - Lower Body Bathing: Supported sit to stand Upper Body Dressing: Simulated;Set up Where Assessed - Upper Body Dressing: Unsupported sitting Lower Body Dressing: Simulated;Minimal assistance Where Assessed - Lower Body Dressing: Supported sit to stand Toilet Transfer: Performed;Minimal assistance Toilet Transfer Method: Sit to Barista: Comfort height toilet;Grab bars Toileting - Architect and Hygiene: Simulated;Min guard Where Assessed - Engineer, mining and Hygiene: Sit to stand from 3-in-1 or toilet Equipment Used: Gait belt;Rolling walker Transfers/Ambulation Related to ADLs: ambulated into bathroom.  Stood at sink for hands, twice and teeth.  Stood to urinate twice and sat on commode for OT to assess transfer ADL Comments: Pt has RC problems.  Can reach to head wtih L.  Could not reach to L foot for adls    OT Diagnosis: Generalized weakness  OT Problem List: Decreased strength;Impaired balance (sitting and/or standing);Decreased activity tolerance;Decreased knowledge of use of DME or AE;Impaired UE functional use OT Treatment Interventions: Self-care/ADL training;Therapeutic activities;Patient/family education;Balance training   OT Goals Acute Rehab OT Goals OT Goal Formulation: With patient/family Time For Goal Achievement: 06/01/12 Potential to Achieve Goals: Good ADL Goals Pt Will Perform Lower Body Bathing: with supervision;Sit to stand from chair ADL Goal: Lower Body Bathing - Progress: Goal set today Pt Will Transfer to Toilet: with supervision;Ambulation;Comfort height toilet;Grab bars ADL Goal: Toilet Transfer - Progress: Goal set today Pt Will Perform Toileting - Clothing Manipulation: with supervision;Sitting on 3-in-1 or toilet;Standing ADL Goal: Toileting - Clothing Manipulation - Progress: Goal set today Pt Will Perform Tub/Shower Transfer: with min assist;Ambulation;Shower transfer;Shower seat without back (min guard) ADL Goal: Web designer - Progress: Goal set today Miscellaneous OT Goals Miscellaneous OT Goal #1: Pt will retrieve clothes with RW with min cues for positioning OT Goal: Miscellaneous Goal #1 - Progress: Goal set today  Visit Information  Last OT Received On: 05/18/12 Assistance Needed: +1    Subjective Data  Subjective: I need to use the bathroom first Patient Stated Goal: none stated.  Agreeable to OT   Prior Functioning     Home Living Lives With: Spouse Available Help at Discharge: Family Type of Home: House Home Access: Stairs to enter Secretary/administrator of Steps: 3 Home Layout: One level Bathroom Shower/Tub: Health visitor: Handicapped height Home Adaptive Equipment: Shower chair without back (grab bar by commode) Additional Comments: Daughter present and can stay with pt as needed Prior Function Level of  Independence: Independent Communication Communication: No difficulties  Vision/Perception     Cognition  Overall Cognitive Status: Appears within functional limits for tasks assessed/performed Area of Impairment: Safety/judgement Arousal/Alertness: Awake/alert Orientation Level: Appears intact for tasks assessed Behavior During Session: [facility] for tasks performed Safety/Judgement: Decreased awareness of need for assistance Cognition - Other Comments: needs cues for walker safety as he is not used to using one but no unsafe behavior/judgement noted by OT this session    Extremity/Trunk Assessment Right Upper Extremity Assessment RUE ROM/Strength/Tone: Deficits RUE ROM/Strength/Tone Deficits: pt reports poor rotator cuff after fall 6 months ago  Left Upper Extremity Assessment LUE ROM/Strength/Tone: Deficits LUE ROM/Strength/Tone Deficits: shoulder limited to 90     Mobility Bed Mobility Bed Mobility: Not assessed Transfers Sit to Stand: 4: Min assist;With upper extremity assist;From chair/3-in-1 Stand to Sit: 4: Min assist;With upper extremity assist;To chair/3-in-1     Shoulder Instructions     Exercise     Balance     End of Session OT - End of Session Activity Tolerance: Patient tolerated treatment well Patient left: in chair;with call bell/phone within reach;with chair alarm set;with family/visitor present  GO Functional Assessment Tool Used: clinical observation Functional Limitation: Self care Self Care Current Status (Z6109): At least 20 percent but less than 40 percent impaired, limited or restricted Self Care Goal Status (U0454): At least 1 percent but less than 20 percent impaired, limited or restricted   [facility] 05/18/2012, 2:49 PM Marica Otter, OTR/L [phone] 05/18/2012

## 2012-05-18 NOTE — ED Notes (Signed)
Report called to [facility] RN 1402-01 bed is ready

## 2012-05-18 NOTE — H&P (Signed)
PCP:    [name], MD [facility]  Chief Complaint:   Fatigue and fever  HPI: Trevor Galloway is a 79 y.o. male   has a past medical history of Cancer; Family hx colonic polyps; RBBB (right bundle branch block); Atrial flutter; Hiatal hernia; and Hyperlipidemia.   Presented with   4 day hx of fevers, chills sore throat, fatigued. He has not been eating or drinking well. Denies any shortness of breath or chest pain. He has had some cough productive of yellowish sputum and one episode of vomiting, no diarrhea no abdominal pain. His family could not get him to get up today so they brought him to ER. Denies any myalgias or arthralgia. He was actually feeling a bit better the day before but have deteriorated today.  He reports having flu shot this year.  Review of Systems:    Pertinent positives include:Fevers, chills, nasal congestion,  sneezing,   Constitutional: No weight loss, night sweats,  fatigue, weight loss,  productive cough,  HEENT:  No headaches, Difficulty swallowing,Tooth/dental problems,Sore throat,  No itching, ear ache, post nasal drip,  Cardio-vascular:  No chest pain, Orthopnea, PND, anasarca, dizziness, palpitations.no Bilateral lower extremity swelling  GI:  No heartburn, indigestion, abdominal pain, nausea, vomiting, diarrhea, change in bowel habits, loss of appetite, melena, blood in stool, hematemesis Resp:  no shortness of breath at rest. No dyspnea on exertion,  .No change in color of mucus.No wheezing. Skin:  no rash or lesions. No jaundice GU:  no dysuria, change in color of urine, no urgency or frequency. No straining to urinate.  No flank pain.  Musculoskeletal:  No joint pain or no joint swelling. No decreased range of motion. No back pain.  Psych:  No change in mood or affect. No depression or anxiety. No memory loss.  Neuro: no localizing neurological complaints, no tingling, no weakness, no double vision, no gait abnormality, no slurred  speech, no confusion  Otherwise ROS are negative except for above, 10 systems were reviewed  Past Medical History: Past Medical History  Diagnosis Date  . Cancer     Prostate cancer  . Family hx colonic polyps   . RBBB (right bundle branch block)   . Atrial flutter     s/p CTI ablation 8/11  . Hiatal hernia   . Hyperlipidemia    Past Surgical History  Procedure Date  . Prostate surgery     seed implant  . Eye surgery     bilateral cataracts  . Hernia repair      Medications: Prior to Admission medications   Medication Sig Start Date End Date Taking? Authorizing Provider  aspirin 325 MG tablet Take 325 mg by mouth daily.     Yes Historical Provider, MD  beta carotene w/minerals (OCUVITE) tablet Take 1 tablet by mouth daily.   Yes Historical Provider, MD    Allergies:  No Known Allergies  Social History:  Ambulatory   Independently  Lives at  Home with family   reports that he has quit smoking. He does not have any smokeless tobacco history on file. He reports that he drinks alcohol. He reports that he does not use illicit drugs.   Family History: family history includes COPD in his father; Cancer in his brother; and Parkinsonism in his brother and sister.    Physical Exam: Patient Vitals for the past 24 hrs:  BP Temp Temp src Pulse Resp SpO2  05/17/12 2247 137/78 mmHg 99.2 F (37.3 C) Oral -  21  98 %  05/17/12 1908 - 101.7 F (38.7 C) Rectal 125  28  90 %  05/17/12 1837 - - - - - 95 %    1. General:  in No Acute distress 2. Psychological: Alert and   Oriented 3. Head/ENT:     Dry Mucous Membranes                          Head Non traumatic, neck supple                          Normal   Dentition 4. SKIN:  decreased Skin turgor,  Skin clean Dry and intact no rash 5. Heart: rapid but Regular rate and rhythm no Murmur, Rub or gallop 6. Lungs: no wheezes mild occasional crackles at the bases   7. Abdomen: Soft, non-tender, Non distended 8. Lower  extremities: no clubbing, cyanosis, or edema 9. Neurologically Grossly intact, moving all 4 extremities equally 10. MSK: Normal range of motion, except for Right should that he has injured 3 months ago.   body mass index is unknown because there is no height or weight on file.   Labs on Admission:   [facility] 05/17/12 2047  NA 142  K 3.7  CL 107  CO2 --  GLUCOSE 134*  BUN 21  CREATININE 0.80  CALCIUM --  MG --  PHOS --   No results found for this basename: AST:2,ALT:2,ALKPHOS:2,BILITOT:2,PROT:2,ALBUMIN:2 in the last 72 hours No results found for this basename: LIPASE:2,AMYLASE:2 in the last 72 hours  Basename 05/17/12 2047 05/17/12 2040  WBC -- 11.6*  NEUTROABS -- 9.5*  HGB 12.6* 12.5*  HCT 37.0* 36.0*  MCV -- 93.3  PLT -- 142*   No results found for this basename: CKTOTAL:3,CKMB:3,CKMBINDEX:3,TROPONINI:3 in the last 72 hours No results found for this basename: TSH,T4TOTAL,FREET3,T3FREE,THYROIDAB in the last 72 hours No results found for this basename: VITAMINB12:2,FOLATE:2,FERRITIN:2,TIBC:2,IRON:2,RETICCTPCT:2 in the last 72 hours No results found for this basename: HGBA1C    The CrCl is unknown because both a height and weight (above a minimum accepted value) are required for this calculation. ABG    Component Value Date/Time   TCO2 23 05/17/2012 2047     No results found for this basename: DDIMER     Other results:  UA no evidence of infection   Cultures: Pending   Radiological Exams on Admission: Dg Chest 2 View  05/17/2012  *RADIOLOGY REPORT*  Clinical Data: Fever and cough.  CHEST - 2 VIEW  Comparison: None.  Findings: There are low lung volumes with mild bibasilar atelectasis.  Calcified granulomas are present in both lungs. There is no confluent airspace opacity or pleural effusion.  The heart size is normal.  There is mild aortic atherosclerosis. Multiple telemetry leads overlie the chest.  The subacromial space of both shoulders is narrowed consistent  with chronic rotator cuff tears.  IMPRESSION: Bibasilar atelectasis.  No consolidation is demonstrated to suggest pneumonia.   Original Report Authenticated By: Carey Bullocks, M.D.     Chart has been reviewed  Assessment/Plan  79 yo M here with dehydration and URI type symptoms associated with fever and tachycardia.   Present on Admission:  . Dehydration - Will continue IVF and check orthostatics tomorrow . Atrial flutter - currently in sinus, continue full does aspirin . TACHYCARDIA - improving with IVF likely due to dehydration but SIRS could not be ruled out.  Marland Kitchen SIRS (systemic  inflammatory response syndrome) - given fever and tachycardia will monitor overnight and obtain blood cultures. does not appear to be toxic at this point. Luck of true flue-like presentation. His symptoms are more than 48 h in duration will hold tamiflu but obtain influenza PCR. Hold off on antibiotics for now as there is no source of bacterial infection. If patient decompensates will treat empirically.    Prophylaxis:   Lovenox, Protonix  CODE STATUS: FULL CODE   Other plan as per orders.  I have spent a total of 55 min on this admission  Delan Ksiazek 05/18/2012, 12:25 AM

## 2012-05-18 NOTE — Progress Notes (Signed)
Patient admitted after midnight with flu like symptoms, cough and general malaise. Atelectasis seen on CXR, but not clear infiltrates. He was dehydrated and with elevation of WBC's (probably demargination. Admitted for further evaluation and treatment.  Plan: -repeat CXR in am (to make sure no PNA is seen after hydration and determine if abx's will be needed -start IS -PT/OT evaluation and treatment -Supportive care.  Of note; Flu by PCR negative.  Trevor Galloway [phone]

## 2012-05-18 NOTE — Care Management Note (Unsigned)
    Page 1 of 1   05/18/2012     4:05:03 PM   CARE MANAGEMENT NOTE 05/18/2012  Patient:  Trevor Galloway, Trevor Galloway   Account Number:  [id number]  Date Initiated:  05/18/2012  Documentation initiated by:  Trevor Galloway  Subjective/Objective Assessment:   ADMITTED W/fatigue,fever.     Action/Plan:   From home w/spouse.Has pcp,pharmacy.   Anticipated DC Date:  05/19/2012   Anticipated DC Plan:  HOME W HOME HEALTH SERVICES      DC Planning Services  CM consult      Choice offered to / List presented to:  C-1 Patient           Status of service:  In process, will continue to follow Medicare Important Message given?   (If response is "NO", the following Medicare IM given date fields will be blank) Date Medicare IM given:   Date Additional Medicare IM given:    Discharge Disposition:    Per UR Regulation:  Reviewed for med. necessity/level of care/duration of stay  If discussed at Long Length of Stay Meetings, dates discussed:    Comments:  05/18/12 Trevor Coates RN,BSN NCM [phone] PT/OT-HH,RW.PATIENT/FAMILY CHOSE [facility].CONTACTED Trevor Galloway(LIASON) INFORMED OF REFERRAL,& RECOMMENDATIONS OF HHPT/OT,RW.IF MD AGREE:  ORDER HHPT/OT/RW,FACE TO FACE.

## 2012-05-19 ENCOUNTER — Observation Stay (HOSPITAL_COMMUNITY): Payer: Medicare Other

## 2012-05-19 DIAGNOSIS — I10 Essential (primary) hypertension: Secondary | ICD-10-CM

## 2012-05-19 DIAGNOSIS — J40 Bronchitis, not specified as acute or chronic: Secondary | ICD-10-CM

## 2012-05-19 DIAGNOSIS — R509 Fever, unspecified: Secondary | ICD-10-CM | POA: Diagnosis not present

## 2012-05-19 DIAGNOSIS — C61 Malignant neoplasm of prostate: Secondary | ICD-10-CM | POA: Diagnosis not present

## 2012-05-19 DIAGNOSIS — D72829 Elevated white blood cell count, unspecified: Secondary | ICD-10-CM | POA: Diagnosis not present

## 2012-05-19 DIAGNOSIS — E860 Dehydration: Secondary | ICD-10-CM | POA: Diagnosis not present

## 2012-05-19 DIAGNOSIS — R5381 Other malaise: Secondary | ICD-10-CM

## 2012-05-19 LAB — CBC
HCT: 33.2 % — ABNORMAL LOW (ref 39.0–52.0)
MCHC: 34.6 g/dL (ref 30.0–36.0)
Platelets: 131 10*3/uL — ABNORMAL LOW (ref 150–400)
RDW: 14.1 % (ref 11.5–15.5)
WBC: 6.7 10*3/uL (ref 4.0–10.5)

## 2012-05-19 MED ORDER — GUAIFENESIN ER 600 MG PO TB12: 600.0000 mg | ORAL_TABLET | Freq: Two times a day (BID) | ORAL | Status: DC

## 2012-05-19 MED ORDER — LEVOFLOXACIN 500 MG PO TABS
500.0000 mg | ORAL_TABLET | Freq: Every day | ORAL | Status: DC
  Filled 2012-05-19: qty 1

## 2012-05-19 MED ORDER — LEVOFLOXACIN 500 MG PO TABS: 500.0000 mg | ORAL_TABLET | Freq: Every day | ORAL | Status: DC

## 2012-05-19 MED ORDER — DEXTROMETHORPHAN-BENZOCAINE 5-7.5 MG MT LOZG: 1.0000 | LOZENGE | OROMUCOSAL | Status: DC | PRN

## 2012-05-19 NOTE — Discharge Summary (Signed)
Physician Discharge Summary  Trevor Galloway ZOX:[id number] DOB: [birth year] DOA: 05/17/2012  PCP: [name], MD  Admit date: 05/17/2012 Discharge date: 05/19/2012  Time spent: > 30 minutes  Recommendations for Outpatient Follow-up:  1. Follow resolution of his symptoms 2. BMET to follow on electrolytes 3. Reevaluate blood pressure and decide if patient will need antihypertensive regimen.  Discharge Diagnoses:  Bronchitis Flu-like symptoms Atrial flutter HYPERLIPIDEMIA Dehydration Leukocytosis Physical deconditioning  OA   Discharge Condition: stable and improved. Will be discharge home with [facility] services. Advise to take medications as prescribed, to keep himself well hydrated and to follow with PCP in 1-2 weeks.  Diet recommendation: heart healthy diet  Filed Weights   05/18/12 0306  Weight: 88.1 kg (194 lb 3.6 oz)    History of present illness:  79 y.o. male has a past medical history of Cancer; Family hx colonic polyps; RBBB (right bundle branch block); Atrial flutter; Hiatal hernia; and Hyperlipidemia. Presented with 4 day hx of fevers, chills sore throat, fatigued and cough. He has not been eating or drinking well. Denies any chest pain. He has had some cough productive of yellowish sputum and one episode of vomiting, no diarrhea no abdominal pain. His family could not get him to get up today so they brought him to ER. Denies any myalgias or arthralgia.  Hospital Course:  1-bronchitis/early pneumonia: Community acquired in nature. Patient will be treated with Levaquin. Will follow with PCP in 1-2 weeks for resolution of symptoms. Will use Mucinex and also CEPACOL  as needed to control cough and sore throat. Patient has been advised to keep himself well-hydrated and to take medications as prescribed. Patient will also use Tylenol as needed to control fever and multiple symptoms.  2-leukocytosis: Secondary to problem #1. Improved after he received antibiotics and fluid  resuscitation.  3-osteoarthritis: Patient was not complaining of pain but was having significant gait impairment. Home health physical therapy and home health occupational therapy has been arranged for pain. Also a rolling walker and a single-point cane has been provided in order to assist with his ambulation.  4-hypokalemia: Most likely secondary to decreased by mouth intake. Repleted within normal limits at discharge. Patient has been advised and encouraged to eat and drink throughout this acute illness.  5-atrial flutter/tachycardia: Most likely precipitated from dehydration. 3 much resolve after fluid resuscitation. Patient had a history of ablation in 2011 and is not taking any antiarrhythmic medication at this point. Will continue aspirin.  *Present his medical problems remains stable throughout hospitalization and the plan is to continue current medication regimen and to follow with his primary care physician for further evaluation and treatment.  Consultations:  PT/OT  Discharge Exam: Filed Vitals:   05/18/12 0604 05/18/12 1529 05/18/12 2120 05/19/12 0443  BP: 151/114 141/70 140/75 165/95  Pulse: 110 96 102 91  Temp:  98.4 F (36.9 C) 99.2 F (37.3 C) 99 F (37.2 C)  TempSrc:  Oral Oral Oral  Resp:  20 18 18   Height:      Weight:      SpO2:  100% 95% 95%    General: No acute distress, afebrile, a little hoarse and with intermittent cough. Cardiovascular: S1 and S2, no rubs, no gallops Respiratory: Scattered rhonchi, no wheezing, no crackles. Abdomen: Soft, nontender, nondistended, positive bowel sounds. Neuro: Nonfocal motor or sensory deficit on exam.  Discharge Instructions  Discharge Orders    Future Appointments: Provider: Department: Dept Phone: Center:   07/06/2012 9:30 AM Sheliah Hatch, MD  Nature conservation officer at  [employer] [phone] [facility]     Future Orders Please Complete By Expires   For home use only DME Walker rolling      Cane  adjustable single point      Diet - low sodium heart healthy      Increase activity slowly      Discharge instructions      Comments:   -Take medications as prescribed -Arrange follow up with PCP in 1-2 weeks -Keep yourself well hydrated -Follow instructions and recommendations from Home Health staff (Physical therapy and Occupational therapy)       Medication List     As of 05/19/2012 10:57 AM    TAKE these medications         aspirin 325 MG tablet   Take 325 mg by mouth daily.      beta carotene w/minerals tablet   Take 1 tablet by mouth daily.      Dextromethorphan-Benzocaine 5-7.5 MG Lozg   Use as directed 1 lozenge in the mouth or throat every 3 (three) hours as needed (sore throat and cough).      guaiFENesin 600 MG 12 hr tablet   Commonly known as: MUCINEX   Take 1 tablet (600 mg total) by mouth 2 (two) times daily.      levofloxacin 500 MG tablet   Commonly known as: LEVAQUIN   Take 1 tablet (500 mg total) by mouth daily.           Follow-up Information    Follow up with [name], MD. Schedule an appointment as soon as possible for a visit in 10 days.   Contact information:   [address] [place] [address] [phone]           The results of significant diagnostics from this hospitalization (including imaging, microbiology, ancillary and laboratory) are listed below for reference.    Significant Diagnostic Studies: Dg Chest 2 View  05/19/2012  *RADIOLOGY REPORT*  Clinical Data: Short of breath.  Cough.  Weakness.  Prostate carcinoma.  CHEST - 2 VIEW  Comparison: 05/17/2012  Findings: Improved aeration of both lungs is seen.  Both lungs are clear. Tiny right posterior pleural effusion versus pleural thickening noted.  Tiny calcified granulomata are seen bilaterally. Heart size is within normal limits.  Ectasia of the thoracic aorta remains stable. No definite mass or lymphadenopathy identified.  IMPRESSION: Resolving bibasilar atelectasis.  Tiny right posterior pleural effusion versus pleural thickening.   Original Report Authenticated By: Myles Rosenthal, M.D.    Dg Chest 2 View  05/17/2012  *RADIOLOGY REPORT*  Clinical Data: Fever and cough.  CHEST - 2 VIEW  Comparison: None.  Findings: There are low lung volumes with mild bibasilar atelectasis.  Calcified granulomas are present in both lungs. There is no confluent airspace opacity or pleural effusion.  The heart size is normal.  There is mild aortic atherosclerosis. Multiple telemetry leads overlie the chest.  The subacromial space of both shoulders is narrowed consistent with chronic rotator cuff tears.  IMPRESSION: Bibasilar atelectasis.  No consolidation is demonstrated to suggest pneumonia.   Original Report Authenticated By: Carey Bullocks, M.D.     Microbiology: Recent Results (from the past 240 hour(s))  RAPID STREP SCREEN     Status: Normal   Collection Time   05/17/12  8:44 PM      Component Value Range Status Comment   Streptococcus, Group A Screen (Direct) NEGATIVE  NEGATIVE Final      Labs:  Basic Metabolic Panel:  Lab 05/18/12 1610 05/18/12 0325 05/17/12 2047  NA 138 135 142  K 3.3* 3.5 3.7  CL 105 103 107  CO2 24 25 --  GLUCOSE 172* 120* 134*  BUN 18 18 21   CREATININE 0.67 0.63 0.80  CALCIUM 8.6 8.9 --  MG -- 1.9 --  PHOS -- 2.3 --   Liver Function Tests:  Lab 05/18/12 0325  AST 18  ALT 19  ALKPHOS 78  BILITOT 0.5  PROT 5.8*  ALBUMIN 3.0*   CBC:  Lab 05/19/12 0505 05/18/12 0325 05/17/12 2047 05/17/12 2040  WBC 6.7 10.6* -- 11.6*  NEUTROABS -- -- -- 9.5*  HGB 11.5* 12.3* 12.6* 12.5*  HCT 33.2* 35.4* 37.0* 36.0*  MCV 93.0 93.2 -- 93.3  PLT 131* 144* -- 142*    Signed:  Rylinn Linzy  [facility] 05/19/2012, 10:57 AM

## 2012-05-19 NOTE — Progress Notes (Signed)
Cm spoke with patient concerning DME. Request. [facility] to provide DMe. Delivery scheduled to residence per pt request. Family to assist pt with home care. [facility] to provide [facility] services per pt choice previously set up by weekday Cm.   Camryn Quesinberry,Rn,BSN

## 2012-05-20 NOTE — ED Provider Notes (Signed)
Medical screening examination/treatment/procedure(s) were conducted as a shared visit with non-physician practitioner(s) and myself.  I personally evaluated the patient during the encounter  Doug Sou, MD 05/20/12 [phone]

## 2012-05-21 DIAGNOSIS — J209 Acute bronchitis, unspecified: Secondary | ICD-10-CM | POA: Diagnosis not present

## 2012-05-21 DIAGNOSIS — M1990 Unspecified osteoarthritis, unspecified site: Secondary | ICD-10-CM | POA: Diagnosis not present

## 2012-05-21 DIAGNOSIS — I4892 Unspecified atrial flutter: Secondary | ICD-10-CM | POA: Diagnosis not present

## 2012-05-21 DIAGNOSIS — R000 Tachycardia, unspecified: Secondary | ICD-10-CM | POA: Diagnosis not present

## 2012-05-21 DIAGNOSIS — R6510 Systemic inflammatory response syndrome (SIRS) of non-infectious origin without acute organ dysfunction: Secondary | ICD-10-CM | POA: Diagnosis not present

## 2012-05-21 DIAGNOSIS — Z5189 Encounter for other specified aftercare: Secondary | ICD-10-CM | POA: Diagnosis not present

## 2012-05-23 DIAGNOSIS — J209 Acute bronchitis, unspecified: Secondary | ICD-10-CM | POA: Diagnosis not present

## 2012-05-23 DIAGNOSIS — Z5189 Encounter for other specified aftercare: Secondary | ICD-10-CM | POA: Diagnosis not present

## 2012-05-23 DIAGNOSIS — M1990 Unspecified osteoarthritis, unspecified site: Secondary | ICD-10-CM | POA: Diagnosis not present

## 2012-05-23 DIAGNOSIS — I4892 Unspecified atrial flutter: Secondary | ICD-10-CM | POA: Diagnosis not present

## 2012-05-23 DIAGNOSIS — R000 Tachycardia, unspecified: Secondary | ICD-10-CM | POA: Diagnosis not present

## 2012-05-23 DIAGNOSIS — R6510 Systemic inflammatory response syndrome (SIRS) of non-infectious origin without acute organ dysfunction: Secondary | ICD-10-CM | POA: Diagnosis not present

## 2012-05-24 DIAGNOSIS — R000 Tachycardia, unspecified: Secondary | ICD-10-CM | POA: Diagnosis not present

## 2012-05-24 DIAGNOSIS — Z5189 Encounter for other specified aftercare: Secondary | ICD-10-CM | POA: Diagnosis not present

## 2012-05-24 DIAGNOSIS — I4892 Unspecified atrial flutter: Secondary | ICD-10-CM | POA: Diagnosis not present

## 2012-05-24 DIAGNOSIS — R6510 Systemic inflammatory response syndrome (SIRS) of non-infectious origin without acute organ dysfunction: Secondary | ICD-10-CM | POA: Diagnosis not present

## 2012-05-24 DIAGNOSIS — M1990 Unspecified osteoarthritis, unspecified site: Secondary | ICD-10-CM | POA: Diagnosis not present

## 2012-05-24 DIAGNOSIS — J209 Acute bronchitis, unspecified: Secondary | ICD-10-CM | POA: Diagnosis not present

## 2012-05-24 LAB — CULTURE, BLOOD (ROUTINE X 2): Culture: NO GROWTH

## 2012-05-25 ENCOUNTER — Ambulatory Visit (INDEPENDENT_AMBULATORY_CARE_PROVIDER_SITE_OTHER): Payer: Medicare Other | Admitting: Family Medicine

## 2012-05-25 ENCOUNTER — Encounter: Payer: Self-pay | Admitting: Family Medicine

## 2012-05-25 VITALS — BP 124/80 | HR 86 | Temp 98.6°F | Ht 69.0 in | Wt 196.8 lb

## 2012-05-25 DIAGNOSIS — E860 Dehydration: Secondary | ICD-10-CM | POA: Diagnosis not present

## 2012-05-25 DIAGNOSIS — R000 Tachycardia, unspecified: Secondary | ICD-10-CM | POA: Diagnosis not present

## 2012-05-25 DIAGNOSIS — R6889 Other general symptoms and signs: Secondary | ICD-10-CM

## 2012-05-25 DIAGNOSIS — J111 Influenza due to unidentified influenza virus with other respiratory manifestations: Secondary | ICD-10-CM | POA: Diagnosis not present

## 2012-05-25 LAB — BASIC METABOLIC PANEL
BUN: 19 mg/dL (ref 6–23)
CO2: 26 mEq/L (ref 19–32)
Calcium: 9.2 mg/dL (ref 8.4–10.5)
Chloride: 109 mEq/L (ref 96–112)
Creat: 0.72 mg/dL (ref 0.50–1.35)
Glucose, Bld: 95 mg/dL (ref 70–99)
Potassium: 3.8 mEq/L (ref 3.5–5.3)
Sodium: 141 mEq/L (ref 135–145)

## 2012-05-25 MED ORDER — BENZONATATE 200 MG PO CAPS: 200.0000 mg | ORAL_CAPSULE | Freq: Three times a day (TID) | ORAL | Status: DC | PRN

## 2012-05-25 NOTE — Patient Instructions (Addendum)
Follow up as scheduled in February Continue the Mucinex Tessalon as needed for cough Drink plenty of fluids We'll notify you of your lab results and make any changes if needed Call with any questions or concerns Hang in there!

## 2012-05-25 NOTE — Progress Notes (Signed)
  Subjective:    Patient ID: Trevor Galloway, male    DOB: [birth year], 79 y.o.   MRN: [id number]  HPI Hospital f/u- was admitted on 1/2 w/ fever of 102.4 when wife wasn't able to get him out of his chair.  Work up was negative for PNA.  Was dehydrated.  Was treated w/ abx x5 days (Levaquin).  Still coughing- cough is productive.  No longer having fevers.  BP was elevated during hospitalization but normal here in office.  Pt had elevated Cr- this was felt to be due to dehydration.  + nasal congestion, no sinus pain/pressure.  + poor sleep since having rotator cuff injury last year.  Review of Systems For ROS see HPI     Objective:   Physical Exam  Constitutional: He appears well-developed and well-nourished. No distress.  HENT:  Head: Normocephalic and atraumatic.  Right Ear: Tympanic membrane normal.  Left Ear: Tympanic membrane normal.  Nose: No mucosal edema or rhinorrhea. Right sinus exhibits no maxillary sinus tenderness and no frontal sinus tenderness. Left sinus exhibits no maxillary sinus tenderness and no frontal sinus tenderness.  Mouth/Throat: Mucous membranes are normal. No oropharyngeal exudate, posterior oropharyngeal edema or posterior oropharyngeal erythema.  Eyes: Conjunctivae normal and EOM are normal. Pupils are equal, round, and reactive to light.  Neck: Normal range of motion. Neck supple.  Cardiovascular: Normal rate, regular rhythm and normal heart sounds.   Pulmonary/Chest: Effort normal and breath sounds normal. No respiratory distress. He has no wheezes.       + dry cough  Lymphadenopathy:    He has no cervical adenopathy.  Skin: Skin is warm and dry.          Assessment & Plan:

## 2012-05-27 NOTE — Assessment & Plan Note (Signed)
Resolved.  Pt now eating and drinking regularly.  Will recheck BMP to assess Cr.

## 2012-05-27 NOTE — Assessment & Plan Note (Signed)
Resolved.  This was likely due to illness and dehydration.  No evidence of afib or flutter today.  Will continue to follow.

## 2012-05-27 NOTE — Assessment & Plan Note (Signed)
Pt reports sxs have improved but cough remains.  Afebrile.  Feeling well.  Completed abx course.  No need for additional abx at this time.  Cough meds prn.  Will follow.

## 2012-05-29 ENCOUNTER — Encounter: Payer: Self-pay | Admitting: *Deleted

## 2012-05-29 DIAGNOSIS — M1990 Unspecified osteoarthritis, unspecified site: Secondary | ICD-10-CM | POA: Diagnosis not present

## 2012-05-29 DIAGNOSIS — R6510 Systemic inflammatory response syndrome (SIRS) of non-infectious origin without acute organ dysfunction: Secondary | ICD-10-CM | POA: Diagnosis not present

## 2012-05-29 DIAGNOSIS — Z5189 Encounter for other specified aftercare: Secondary | ICD-10-CM | POA: Diagnosis not present

## 2012-05-29 DIAGNOSIS — J209 Acute bronchitis, unspecified: Secondary | ICD-10-CM | POA: Diagnosis not present

## 2012-05-29 DIAGNOSIS — I4892 Unspecified atrial flutter: Secondary | ICD-10-CM | POA: Diagnosis not present

## 2012-05-29 DIAGNOSIS — R000 Tachycardia, unspecified: Secondary | ICD-10-CM | POA: Diagnosis not present

## 2012-05-31 DIAGNOSIS — I4892 Unspecified atrial flutter: Secondary | ICD-10-CM | POA: Diagnosis not present

## 2012-05-31 DIAGNOSIS — M1990 Unspecified osteoarthritis, unspecified site: Secondary | ICD-10-CM | POA: Diagnosis not present

## 2012-05-31 DIAGNOSIS — R6510 Systemic inflammatory response syndrome (SIRS) of non-infectious origin without acute organ dysfunction: Secondary | ICD-10-CM | POA: Diagnosis not present

## 2012-05-31 DIAGNOSIS — R000 Tachycardia, unspecified: Secondary | ICD-10-CM | POA: Diagnosis not present

## 2012-05-31 DIAGNOSIS — Z5189 Encounter for other specified aftercare: Secondary | ICD-10-CM | POA: Diagnosis not present

## 2012-05-31 DIAGNOSIS — J209 Acute bronchitis, unspecified: Secondary | ICD-10-CM | POA: Diagnosis not present

## 2012-06-05 DIAGNOSIS — R6510 Systemic inflammatory response syndrome (SIRS) of non-infectious origin without acute organ dysfunction: Secondary | ICD-10-CM | POA: Diagnosis not present

## 2012-06-05 DIAGNOSIS — Z5189 Encounter for other specified aftercare: Secondary | ICD-10-CM | POA: Diagnosis not present

## 2012-06-05 DIAGNOSIS — R000 Tachycardia, unspecified: Secondary | ICD-10-CM | POA: Diagnosis not present

## 2012-06-05 DIAGNOSIS — M1990 Unspecified osteoarthritis, unspecified site: Secondary | ICD-10-CM | POA: Diagnosis not present

## 2012-06-05 DIAGNOSIS — I4892 Unspecified atrial flutter: Secondary | ICD-10-CM | POA: Diagnosis not present

## 2012-06-05 DIAGNOSIS — J209 Acute bronchitis, unspecified: Secondary | ICD-10-CM | POA: Diagnosis not present

## 2012-06-06 DIAGNOSIS — J209 Acute bronchitis, unspecified: Secondary | ICD-10-CM

## 2012-06-06 DIAGNOSIS — I4892 Unspecified atrial flutter: Secondary | ICD-10-CM

## 2012-06-06 DIAGNOSIS — R6510 Systemic inflammatory response syndrome (SIRS) of non-infectious origin without acute organ dysfunction: Secondary | ICD-10-CM

## 2012-06-06 DIAGNOSIS — Z5189 Encounter for other specified aftercare: Secondary | ICD-10-CM

## 2012-07-06 ENCOUNTER — Encounter: Payer: Self-pay | Admitting: Family Medicine

## 2012-07-06 ENCOUNTER — Ambulatory Visit (INDEPENDENT_AMBULATORY_CARE_PROVIDER_SITE_OTHER): Payer: Medicare Other | Admitting: Family Medicine

## 2012-07-06 VITALS — BP 140/80 | HR 94 | Temp 98.4°F | Ht 69.0 in | Wt 197.9 lb

## 2012-07-06 DIAGNOSIS — E785 Hyperlipidemia, unspecified: Secondary | ICD-10-CM

## 2012-07-06 LAB — LDL CHOLESTEROL, DIRECT: Direct LDL: 134.4 mg/dL

## 2012-07-06 LAB — HEPATIC FUNCTION PANEL
ALT: 16 U/L (ref 0–53)
AST: 22 U/L (ref 0–37)
Alkaline Phosphatase: 79 U/L (ref 39–117)
Bilirubin, Direct: 0.1 mg/dL (ref 0.0–0.3)
Total Bilirubin: 0.7 mg/dL (ref 0.3–1.2)
Total Protein: 6.7 g/dL (ref 6.0–8.3)

## 2012-07-06 LAB — LIPID PANEL: Total CHOL/HDL Ratio: 5

## 2012-07-06 NOTE — Patient Instructions (Addendum)
Schedule your complete physical in 6 months We'll notify you of your lab results and make any changes if needed Call with any questions or concerns Happy Spring!!! 

## 2012-07-06 NOTE — Assessment & Plan Note (Signed)
Pt's trigs were elevated at last check.  Pt has not been exercising.  Not following particular diet.  Due for repeat labs.  Start meds prn.  Pt expressed understanding and is in agreement w/ plan.

## 2012-07-06 NOTE — Progress Notes (Signed)
  Subjective:    Patient ID: Trevor Galloway, male    DOB: [birth year], 79 y.o.   MRN: [id number]  HPI Hyperlipidemia- triglycerides were elevated in August.  Plan was to watch diet and attempt to get regular exercise but other than walking to the mailbox daily, pt not exercising.  No CP, SOB, HAs, N/V, abd pain.  + LE edema bilaterally, improves w/ elevation.   Review of Systems For ROS see HPI     Objective:   Physical Exam  Vitals reviewed. Constitutional: He is oriented to person, place, and time. He appears well-developed and well-nourished. No distress.  HENT:  Head: Normocephalic and atraumatic.  Eyes: Conjunctivae and EOM are normal. Pupils are equal, round, and reactive to light.  Neck: Normal range of motion. Neck supple. No thyromegaly present.  Cardiovascular: Normal rate, regular rhythm, normal heart sounds and intact distal pulses.   No murmur heard. Pulmonary/Chest: Effort normal and breath sounds normal. No respiratory distress.  Abdominal: Soft. Bowel sounds are normal. He exhibits no distension.  Musculoskeletal: He exhibits edema (1+ edema bilaterally).  Lymphadenopathy:    He has no cervical adenopathy.  Neurological: He is alert and oriented to person, place, and time. No cranial nerve deficit.  Skin: Skin is warm and dry.  Psychiatric: He has a normal mood and affect. His behavior is normal.          Assessment & Plan:

## 2012-08-15 ENCOUNTER — Ambulatory Visit (INDEPENDENT_AMBULATORY_CARE_PROVIDER_SITE_OTHER): Payer: Medicare Other | Admitting: Family Medicine

## 2012-08-15 ENCOUNTER — Encounter: Payer: Self-pay | Admitting: Family Medicine

## 2012-08-15 VITALS — BP 120/80 | HR 111 | Temp 98.6°F | Ht 69.0 in | Wt 197.4 lb

## 2012-08-15 DIAGNOSIS — J309 Allergic rhinitis, unspecified: Secondary | ICD-10-CM | POA: Diagnosis not present

## 2012-08-15 NOTE — Assessment & Plan Note (Signed)
New.  No evidence of bacterial infxn on PE.  No need for abx.  Pt w/ copious PND.  Start daily OTC antihistamine.  Reviewed supportive care and red flags that should prompt return.  Pt expressed understanding and is in agreement w/ plan.

## 2012-08-15 NOTE — Patient Instructions (Addendum)
This appears to be seasonal allergies Start Claritin or Zyrtec daily Drink plenty of fluids REST if you need to [place] in there!!!

## 2012-08-15 NOTE — Progress Notes (Signed)
  Subjective:    Patient ID: Trevor Galloway, male    DOB: 1927-04-28, 79 y.o.   MRN: [id number]  HPI URI- sxs started Sunday w/ nasal congestion, 'head cold'.  Minimal cough.  No facial pain/pressure.  + runny nose.  'he doesn't feel good'- per wife.  'i feel ok now'.  No ear pain.  No hx of seasonal allergies.  No known sick contacts.   Review of Systems For ROS see HPI     Objective:   Physical Exam  Vitals reviewed. Constitutional: He appears well-developed and well-nourished. No distress.  HENT:  Head: Normocephalic and atraumatic.  No TTP over sinuses + turbinate edema + PND TMs normal bilaterally  Eyes: Conjunctivae and EOM are normal. Pupils are equal, round, and reactive to light.  Neck: Normal range of motion. Neck supple.  Cardiovascular: Normal rate, regular rhythm and normal heart sounds.   Pulmonary/Chest: Effort normal and breath sounds normal. No respiratory distress. He has no wheezes.  Lymphadenopathy:    He has no cervical adenopathy.  Skin: Skin is warm and dry.          Assessment & Plan:

## 2012-10-03 DIAGNOSIS — Z961 Presence of intraocular lens: Secondary | ICD-10-CM | POA: Diagnosis not present

## 2012-10-03 DIAGNOSIS — H3530 Unspecified macular degeneration: Secondary | ICD-10-CM | POA: Diagnosis not present

## 2012-11-09 DIAGNOSIS — H353190 Nonexudative age-related macular degeneration, unspecified eye, stage unspecified: Secondary | ICD-10-CM | POA: Diagnosis not present

## 2012-11-09 DIAGNOSIS — H35379 Puckering of macula, unspecified eye: Secondary | ICD-10-CM | POA: Diagnosis not present

## 2012-11-09 DIAGNOSIS — Z961 Presence of intraocular lens: Secondary | ICD-10-CM | POA: Diagnosis not present

## 2012-11-09 DIAGNOSIS — H43819 Vitreous degeneration, unspecified eye: Secondary | ICD-10-CM | POA: Diagnosis not present

## 2012-11-12 ENCOUNTER — Telehealth: Payer: Self-pay | Admitting: Internal Medicine

## 2012-11-12 NOTE — Telephone Encounter (Signed)
Spoke with patients wife and she states his BP has been increasing since Fri.  She states he does not "feel up to par"  His BP is running 160's/100 HR 94-100.  He is only on an ASA a day.  I let her know that Dr Johney Frame could see him  on 11/15/12 at 10:45.  She is going to call Dr Lawernce Keas office to follow up on his BP today and call me back if he needs [place] to see him back if he is back out of rhythm

## 2012-11-12 NOTE — Telephone Encounter (Signed)
New Problem:    Patient's wife called in because his BP and HR are high and wants him to be seen today.  Please call back.

## 2012-11-12 NOTE — Telephone Encounter (Signed)
Follow up  Pt's wife is calling regarding an appt. She asked if you could call her back.

## 2012-11-12 NOTE — Telephone Encounter (Signed)
New Problem:    Patient's wife called in stating that Dr. Beverely Low would not be able to see him soon and would like to speak with you.  Please call back.

## 2012-11-12 NOTE — Telephone Encounter (Signed)
I have called Dr Rennis Golden office and Dr Laury Axon will see him tomorrow at 1:30.  Patient's wife aware and very appreciative

## 2012-11-13 ENCOUNTER — Ambulatory Visit (INDEPENDENT_AMBULATORY_CARE_PROVIDER_SITE_OTHER): Payer: Medicare Other | Admitting: Family Medicine

## 2012-11-13 ENCOUNTER — Encounter: Payer: Self-pay | Admitting: Family Medicine

## 2012-11-13 ENCOUNTER — Ambulatory Visit (HOSPITAL_BASED_OUTPATIENT_CLINIC_OR_DEPARTMENT_OTHER)
Admission: RE | Admit: 2012-11-13 | Discharge: 2012-11-13 | Disposition: A | Discharge status: Home / Self Care | Payer: Medicare Other | Source: Ambulatory Visit | Attending: Family Medicine | Admitting: Family Medicine

## 2012-11-13 ENCOUNTER — Telehealth: Payer: Self-pay | Admitting: Internal Medicine

## 2012-11-13 VITALS — BP 162/110 | HR 105 | Temp 98.3°F | Wt 199.6 lb

## 2012-11-13 DIAGNOSIS — R000 Tachycardia, unspecified: Secondary | ICD-10-CM | POA: Diagnosis not present

## 2012-11-13 DIAGNOSIS — I10 Essential (primary) hypertension: Secondary | ICD-10-CM | POA: Insufficient documentation

## 2012-11-13 DIAGNOSIS — R609 Edema, unspecified: Secondary | ICD-10-CM | POA: Insufficient documentation

## 2012-11-13 DIAGNOSIS — M47814 Spondylosis without myelopathy or radiculopathy, thoracic region: Secondary | ICD-10-CM | POA: Diagnosis not present

## 2012-11-13 DIAGNOSIS — M7989 Other specified soft tissue disorders: Secondary | ICD-10-CM | POA: Diagnosis not present

## 2012-11-13 DIAGNOSIS — J8410 Pulmonary fibrosis, unspecified: Secondary | ICD-10-CM | POA: Diagnosis not present

## 2012-11-13 LAB — BASIC METABOLIC PANEL
CO2: 23 mEq/L (ref 19–32)
Chloride: 105 mEq/L (ref 96–112)
Creatinine, Ser: 0.9 mg/dL (ref 0.4–1.5)
Potassium: 4 mEq/L (ref 3.5–5.1)
Sodium: 137 mEq/L (ref 135–145)

## 2012-11-13 LAB — CBC WITH DIFFERENTIAL/PLATELET
Basophils Relative: 0.7 % (ref 0.0–3.0)
Eosinophils Absolute: 0.1 10*3/uL (ref 0.0–0.7)
Eosinophils Relative: 0.8 % (ref 0.0–5.0)
Hemoglobin: 13.9 g/dL (ref 13.0–17.0)
Lymphocytes Relative: 23.1 % (ref 12.0–46.0)
MCHC: 34.6 g/dL (ref 30.0–36.0)
MCV: 94.7 fl (ref 78.0–100.0)
Monocytes Absolute: 0.9 10*3/uL (ref 0.1–1.0)
Neutro Abs: 5.2 10*3/uL (ref 1.4–7.7)
Neutrophils Relative %: 64.6 % (ref 43.0–77.0)
RBC: 4.24 Mil/uL (ref 4.22–5.81)
WBC: 8.1 10*3/uL (ref 4.5–10.5)

## 2012-11-13 LAB — HEPATIC FUNCTION PANEL
ALT: 14 U/L (ref 0–53)
Albumin: 4.2 g/dL (ref 3.5–5.2)
Alkaline Phosphatase: 92 U/L (ref 39–117)
Bilirubin, Direct: 0.1 mg/dL (ref 0.0–0.3)
Total Protein: 7 g/dL (ref 6.0–8.3)

## 2012-11-13 MED ORDER — FUROSEMIDE 20 MG PO TABS
20.0000 mg | ORAL_TABLET | Freq: Every day | ORAL | Status: DC
Start: 2012-11-13 — End: 2013-09-23

## 2012-11-13 MED ORDER — METOPROLOL SUCCINATE ER 50 MG PO TB24: 50.0000 mg | ORAL_TABLET | Freq: Every day | ORAL | Status: DC

## 2012-11-13 NOTE — Assessment & Plan Note (Signed)
toprol will help this as well Check echo

## 2012-11-13 NOTE — Progress Notes (Signed)
  Subjective:    Trevor Galloway is a 79 y.o. male who presents for evaluation of elevated blood pressures. Age at onset of elevated blood pressure:  86. Cardiac symptoms: dyspnea, exertional chest pressure/discomfort, fatigue and lower extremity edema. Patient denies: chest pain, chest pressure/discomfort, claudication, irregular heart beat, near-syncope, orthopnea, palpitations and syncope. Cardiovascular risk factors: advanced age (older than 69 for men, 9 for women), diabetes mellitus, dyslipidemia, hypertension, male gender and sedentary lifestyle. Use of agents associated with hypertension: none. History of target organ damage: none.  The following portions of the patient's history were reviewed and updated as appropriate: allergies, current medications, past family history, past medical history, past social history, past surgical history and problem list.  Review of Systems Pertinent items are noted in HPI.   Objective:    BP 162/110  Pulse 105  Temp(Src) 98.3 F (36.8 C) (Oral)  Wt 199 lb 9.6 oz (90.538 kg)  BMI 29.46 kg/m2  SpO2 95% General appearance: alert, cooperative, appears stated age and no distress Nose: Nares normal. Septum midline. Mucosa normal. No drainage or sinus tenderness. Throat: lips, mucosa, and tongue normal; teeth and gums normal Neck: no adenopathy, no carotid bruit, no JVD, supple, symmetrical, trachea midline and thyroid not enlarged, symmetric, no tenderness/mass/nodules Lungs: clear to auscultation bilaterally Heart: S1, S2 normal Extremities: edema +1 pitting edema b/l , no calf pain and no errythema  Cardiographics ECG: no change from previous    Assessment:    Hypertension, stage 1 . Evidence of target organ damage: none.    Plan:    Medication: begin toprol xl and lasix. Screening labs for initial evaluation: basic metabolic panel, blood sugar, lipid panel and urinalysis. Dietary sodium restriction. Regular aerobic exercise. Check blood  pressures 2-3 times weekly and record. Follow up: 2 weeks and as needed.

## 2012-11-13 NOTE — Assessment & Plan Note (Signed)
Lasix Keep elevated rto 2-3 weeks

## 2012-11-13 NOTE — Patient Instructions (Signed)
Edema  Edema is an abnormal build-up of fluids in tissues. Because this is partly dependent on gravity (water flows to the lowest place), it is more common in the legs and thighs (lower extremities). It is also common in the looser tissues, like around the eyes. Painless swelling of the feet and ankles is common and increases as a person ages. It may affect both legs and may include the calves or even thighs. When squeezed, the fluid may move out of the affected area and may leave a dent for a few moments.  CAUSES    Prolonged standing or sitting in one place for extended periods of time. Movement helps pump tissue fluid into the veins, and absence of movement prevents this, resulting in edema.   Varicose veins. The valves in the veins do not work as well as they should. This causes fluid to leak into the tissues.   Fluid and salt overload.   Injury, burn, or surgery to the leg, ankle, or foot, may damage veins and allow fluid to leak out.   Sunburn damages vessels. Leaky vessels allow fluid to go out into the sunburned tissues.   Allergies (from insect bites or stings, medications or chemicals) cause swelling by allowing vessels to become leaky.   Protein in the blood helps keep fluid in your vessels. Low protein, as in malnutrition, allows fluid to leak out.   Hormonal changes, including pregnancy and menstruation, cause fluid retention. This fluid may leak out of vessels and cause edema.   Medications that cause fluid retention. Examples are sex hormones, blood pressure medications, steroid treatment, or anti-depressants.   Some illnesses cause edema, especially heart failure, kidney disease, or liver disease.   Surgery that cuts veins or lymph nodes, such as surgery done for the heart or for breast cancer, may result in edema.  DIAGNOSIS   Your caregiver is usually easily able to determine what is causing your swelling (edema) by simply asking what is wrong (getting a history) and examining you (doing  a physical). Sometimes x-rays, EKG (electrocardiogram or heart tracing), and blood work may be done to evaluate for underlying medical illness.  TREATMENT   General treatment includes:   Leg elevation (or elevation of the affected body part).   Restriction of fluid intake.   Prevention of fluid overload.   Compression of the affected body part. Compression with elastic bandages or support stockings squeezes the tissues, preventing fluid from entering and forcing it back into the blood vessels.   Diuretics (also called water pills or fluid pills) pull fluid out of your body in the form of increased urination. These are effective in reducing the swelling, but can have side effects and must be used only under your caregiver's supervision. Diuretics are appropriate only for some types of edema.  The specific treatment can be directed at any underlying causes discovered. Heart, liver, or kidney disease should be treated appropriately.  HOME CARE INSTRUCTIONS    Elevate the legs (or affected body part) above the level of the heart, while lying down.   Avoid sitting or standing still for prolonged periods of time.   Avoid putting anything directly under the knees when lying down, and do not wear constricting clothing or garters on the upper legs.   Exercising the legs causes the fluid to work back into the veins and lymphatic channels. This may help the swelling go down.   The pressure applied by elastic bandages or support stockings can help reduce ankle swelling.     A low-salt diet may help reduce fluid retention and decrease the ankle swelling.   Take any medications exactly as prescribed.  SEEK MEDICAL CARE IF:   Your edema is not responding to recommended treatments.  SEEK IMMEDIATE MEDICAL CARE IF:    You develop shortness of breath or chest pain.   You cannot breathe when you lay down; or if, while lying down, you have to get up and go to the window to get your breath.   You are having increasing  swelling without relief from treatment.   You develop a fever over 102 F (38.9 C).   You develop pain or redness in the areas that are swollen.   Tell your caregiver right away if you have gained 3 lb/1.4 kg in 1 day or 5 lb/2.3 kg in a week.  MAKE SURE YOU:    Understand these instructions.   Will watch your condition.   Will get help right away if you are not doing well or get worse.  Document Released: 05/02/2005 Document Revised: 11/01/2011 Document Reviewed: 12/19/2007  [employer] Patient Information 2014 [employer].

## 2012-11-13 NOTE — Telephone Encounter (Signed)
error 

## 2012-11-15 ENCOUNTER — Encounter: Payer: Self-pay | Admitting: *Deleted

## 2012-11-20 ENCOUNTER — Other Ambulatory Visit (HOSPITAL_COMMUNITY): Payer: Medicare Other

## 2012-11-20 DIAGNOSIS — C61 Malignant neoplasm of prostate: Secondary | ICD-10-CM | POA: Diagnosis not present

## 2012-11-21 ENCOUNTER — Ambulatory Visit (HOSPITAL_COMMUNITY): Payer: Medicare Other | Attending: Family Medicine | Admitting: Radiology

## 2012-11-21 DIAGNOSIS — R609 Edema, unspecified: Secondary | ICD-10-CM | POA: Insufficient documentation

## 2012-11-21 DIAGNOSIS — I4510 Unspecified right bundle-branch block: Secondary | ICD-10-CM | POA: Insufficient documentation

## 2012-11-21 DIAGNOSIS — Z87891 Personal history of nicotine dependence: Secondary | ICD-10-CM | POA: Insufficient documentation

## 2012-11-21 DIAGNOSIS — E785 Hyperlipidemia, unspecified: Secondary | ICD-10-CM | POA: Insufficient documentation

## 2012-11-21 DIAGNOSIS — I079 Rheumatic tricuspid valve disease, unspecified: Secondary | ICD-10-CM | POA: Diagnosis not present

## 2012-11-21 DIAGNOSIS — R000 Tachycardia, unspecified: Secondary | ICD-10-CM

## 2012-11-21 NOTE — Progress Notes (Signed)
Echocardiogram performed.  

## 2012-11-27 ENCOUNTER — Ambulatory Visit: Payer: Medicare Other | Admitting: Family Medicine

## 2012-11-27 DIAGNOSIS — C61 Malignant neoplasm of prostate: Secondary | ICD-10-CM | POA: Diagnosis not present

## 2012-11-28 ENCOUNTER — Ambulatory Visit (INDEPENDENT_AMBULATORY_CARE_PROVIDER_SITE_OTHER): Payer: Medicare Other | Admitting: Family Medicine

## 2012-11-28 ENCOUNTER — Encounter: Payer: Self-pay | Admitting: Family Medicine

## 2012-11-28 VITALS — BP 130/70 | HR 66 | Temp 98.4°F | Ht 69.0 in | Wt 197.8 lb

## 2012-11-28 DIAGNOSIS — F329 Major depressive disorder, single episode, unspecified: Secondary | ICD-10-CM

## 2012-11-28 DIAGNOSIS — R609 Edema, unspecified: Secondary | ICD-10-CM

## 2012-11-28 DIAGNOSIS — I10 Essential (primary) hypertension: Secondary | ICD-10-CM

## 2012-11-28 LAB — BASIC METABOLIC PANEL
BUN: 19 mg/dL (ref 6–23)
Calcium: 9.7 mg/dL (ref 8.4–10.5)
GFR: 90.01 mL/min (ref >60.00)
Glucose, Bld: 98 mg/dL (ref 70–99)

## 2012-11-28 MED ORDER — SERTRALINE HCL 25 MG PO TABS: 25.0000 mg | ORAL_TABLET | Freq: Every day | ORAL | Status: DC

## 2012-11-28 NOTE — Assessment & Plan Note (Signed)
New.  Pt has lost all spunk shown in previous visits.  Pt denies sadness but has lost enjoyment in the things he previously did.  Wife is concerned.  Will start low dose med and follow closely.  Pt expressed understanding and is in agreement w/ plan.

## 2012-11-28 NOTE — Assessment & Plan Note (Signed)
New.  Pt was started on metoprolol and lasix last visit w/ good improvement in BP.  Check BMP to assess electrolytes.  Will continue to follow.

## 2012-11-28 NOTE — Assessment & Plan Note (Signed)
Pt continues to have LE edema but improved over previous since starting lasix.  Check BMP.  Continue meds.  Will follow.

## 2012-11-28 NOTE — Progress Notes (Signed)
  Subjective:    Patient ID: Trevor Galloway, male    DOB: 03-26-1927, 79 y.o.   MRN: [id number]  HPI HTN- chronic problem, was elevated at last visit and was started on Metoprolol and Lasix.  BP much improved.  No CP.  Intermittent SOB.  No HAs.  Having ongoing eye issues- has had 2 opinions and there is not much to do.  Denies sadness or depression.  Edema- ongoing problem.  Was started on Lasix last visit and swelling has improved but continues to have trace-1+ edema bilaterally.  No pain w/ swelling.  Improves w/ elevation nightly but doesn't resolve.  Depression- pt is very flat today.  Wife reports his response to everything is 'i don't feel like it'.  Used to enjoy going to church but has not been recently.  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  Denies sadness, tearfulness.   Review of Systems For ROS see HPI     Objective:   Physical Exam  Vitals reviewed. Constitutional: He is oriented to person, place, and time. He appears well-developed and well-nourished. No distress.  HENT:  Head: Normocephalic and atraumatic.  Eyes: Conjunctivae and EOM are normal. Pupils are equal, round, and reactive to light.  Neck: Normal range of motion. Neck supple. No thyromegaly present.  Cardiovascular: Normal rate, regular rhythm, normal heart sounds and intact distal pulses.   No murmur heard. Pulmonary/Chest: Effort normal and breath sounds normal. No respiratory distress.  Abdominal: Soft. Bowel sounds are normal. He exhibits no distension.  Musculoskeletal: He exhibits edema (1+ edema bilaterally).  Lymphadenopathy:    He has no cervical adenopathy.  Neurological: He is alert and oriented to person, place, and time. No cranial nerve deficit.  Slow, shuffling gait  Skin: Skin is warm and dry.  Psychiatric: His behavior is normal.  Flat affect          Assessment & Plan:

## 2012-11-28 NOTE — Patient Instructions (Addendum)
Follow up as scheduled We'll notify you of your lab results and make any changes if needed Start the Zoloft at supper nightly Call with any questions or concerns Hang in there!

## 2012-11-29 ENCOUNTER — Encounter: Payer: Self-pay | Admitting: *Deleted

## 2013-01-04 ENCOUNTER — Encounter: Payer: Self-pay | Admitting: Family Medicine

## 2013-01-04 ENCOUNTER — Ambulatory Visit (INDEPENDENT_AMBULATORY_CARE_PROVIDER_SITE_OTHER): Payer: Medicare Other | Admitting: Family Medicine

## 2013-01-04 VITALS — BP 132/78 | HR 62 | Temp 98.8°F | Ht 69.0 in | Wt 190.0 lb

## 2013-01-04 DIAGNOSIS — F329 Major depressive disorder, single episode, unspecified: Secondary | ICD-10-CM | POA: Diagnosis not present

## 2013-01-04 DIAGNOSIS — I10 Essential (primary) hypertension: Secondary | ICD-10-CM | POA: Diagnosis not present

## 2013-01-04 DIAGNOSIS — Z0000 Encounter for general adult medical examination without abnormal findings: Secondary | ICD-10-CM | POA: Diagnosis not present

## 2013-01-04 DIAGNOSIS — E785 Hyperlipidemia, unspecified: Secondary | ICD-10-CM | POA: Diagnosis not present

## 2013-01-04 LAB — LIPID PANEL: HDL: 35.1 mg/dL — ABNORMAL LOW (ref >39.00)

## 2013-01-04 NOTE — Patient Instructions (Addendum)
Follow up in 3 months to recheck mood and energy Continue the Zoloft 25mg  daily- we can increase this at any time Continue the Lasix for the swelling You look great!  Keep up the good work! Call with any questions or concerns Happy Labor Day!!!

## 2013-01-04 NOTE — Progress Notes (Signed)
  Subjective:    Patient ID: Trevor Galloway, male    DOB: [birth year], 79 y.o.   MRN: [id number]  HPI Here today for CPE.  Risk Factors: HTN- chronic problem, well controlled today.  On Lasix, metoprolol.  No CP, SOB, HAs, visual changes, edema. Hyperlipidemia-chronic problem, attempting to control w/ healthy diet and regular exercise. Physical Activity: walking but not regularly Fall Risk: no recent falls, low risk Depression: started on Zoloft last visit, seems to be doing better Hearing: no concerns about hearing, normal to conversational tones and whispered voice ADL's: independent Cognitive: normal linear thought process, memory and attention intact Home Safety: safe at home, lives w/ wife, daughter is local Height, Weight, BMI, Visual Acuity: see vitals, vision corrected to 20/20 w/ glasses Counseling: UTD on colonoscopy, urology Labs Ordered: See A&P Care Plan: See A&P    Review of Systems Patient reports no vision/hearing changes, anorexia, fever ,adenopathy, persistant/recurrent hoarseness, swallowing issues, chest pain, palpitations, edema, persistant/recurrent cough, hemoptysis, dyspnea (rest,exertional, paroxysmal nocturnal), gastrointestinal  bleeding (melena, rectal bleeding), abdominal pain, excessive heart burn, GU symptoms (dysuria, hematuria, voiding/incontinence issues) syncope, focal weakness, memory loss, numbness & tingling, skin/hair/nail changes, depression, anxiety, abnormal bruising/bleeding, musculoskeletal symptoms/signs.     Objective:   Physical Exam BP 132/78  Pulse 62  Temp(Src) 98.8 F (37.1 C) (Oral)  Ht 5\' 9"  (1.753 m)  Wt 190 lb (86.183 kg)  BMI 28.05 kg/m2  SpO2 97%  General Appearance:    Alert, cooperative, no distress, appears stated age  Head:    Normocephalic, without obvious abnormality, atraumatic  Eyes:    PERRL, conjunctiva/corneas clear, EOM's intact, fundi    benign, both eyes       Ears:    Normal TM's and external ear canals,  both ears  Nose:   Nares normal, septum midline, mucosa normal, no drainage   or sinus tenderness  Throat:   Lips, mucosa, and tongue normal; teeth and gums normal  Neck:   Supple, symmetrical, trachea midline, no adenopathy;       thyroid:  No enlargement/tenderness/nodules  Back:     Symmetric, no curvature, ROM normal, no CVA tenderness  Lungs:     Clear to auscultation bilaterally, respirations unlabored  Chest wall:    No tenderness or deformity  Heart:    Regular rate and rhythm, S1 and S2 normal, no murmur, rub   or gallop  Abdomen:     Soft, non-tender, bowel sounds active all four quadrants,    no masses, no organomegaly  Genitalia:    Deferred to urology  Rectal:    Extremities:   Extremities normal, atraumatic, no cyanosis or edema  Pulses:   2+ and symmetric all extremities  Skin:   Skin color, texture, turgor normal, no rashes or lesions  Lymph nodes:   Cervical, supraclavicular, and axillary nodes normal  Neurologic:   CNII-XII intact. Normal strength, sensation and reflexes      throughout          Assessment & Plan:

## 2013-01-07 NOTE — Assessment & Plan Note (Signed)
Chronic problem.  Adequate control.  Asymptomatic.  No med changes. 

## 2013-01-07 NOTE — Assessment & Plan Note (Signed)
sxs improved since starting Zoloft at last visit.  Pt prefers to hold at current dose and monitor sxs.  Will continue to follow closely.

## 2013-01-07 NOTE — Assessment & Plan Note (Signed)
Pt's PE WNL.  UTD on health maintenance.  Reviewed recent labs- check lipids as this is the only thing missing in last few months.  Anticipatory guidance provided.

## 2013-01-07 NOTE — Assessment & Plan Note (Signed)
Chronic problem.  Not currently on meds.  Pt prefers to try and control w/ healthy diet.  Check labs.  Adjust meds prn

## 2013-01-22 ENCOUNTER — Telehealth: Payer: Self-pay | Admitting: Family Medicine

## 2013-01-22 NOTE — Telephone Encounter (Signed)
Spoke with pt and he advised he has been having diarrhea pretty much since he started the furosemide in July. Pt advised that per Beverely Low he needs to stop Lasix and call our office on Friday if diarrhea does not stop.

## 2013-01-22 NOTE — Telephone Encounter (Signed)
Patient daughter called for her dad Trevor Galloway wanting [place] to give her dad a call back because he is experiencing diarrhea from taking  furosemide (LASIX) 20 MG tablet. Just wanted a nurse to call him back. thanks

## 2013-02-18 DIAGNOSIS — Z23 Encounter for immunization: Secondary | ICD-10-CM | POA: Diagnosis not present

## 2013-02-28 DIAGNOSIS — D235 Other benign neoplasm of skin of trunk: Secondary | ICD-10-CM | POA: Diagnosis not present

## 2013-02-28 DIAGNOSIS — Z85828 Personal history of other malignant neoplasm of skin: Secondary | ICD-10-CM | POA: Diagnosis not present

## 2013-03-03 IMAGING — CR DG CHEST 2V
2 series · 2 of 2 positions shown · non-contrast
Comparison: None.

CLINICAL DATA: Fever and cough.

CHEST - 2 VIEW

[w chest lat]
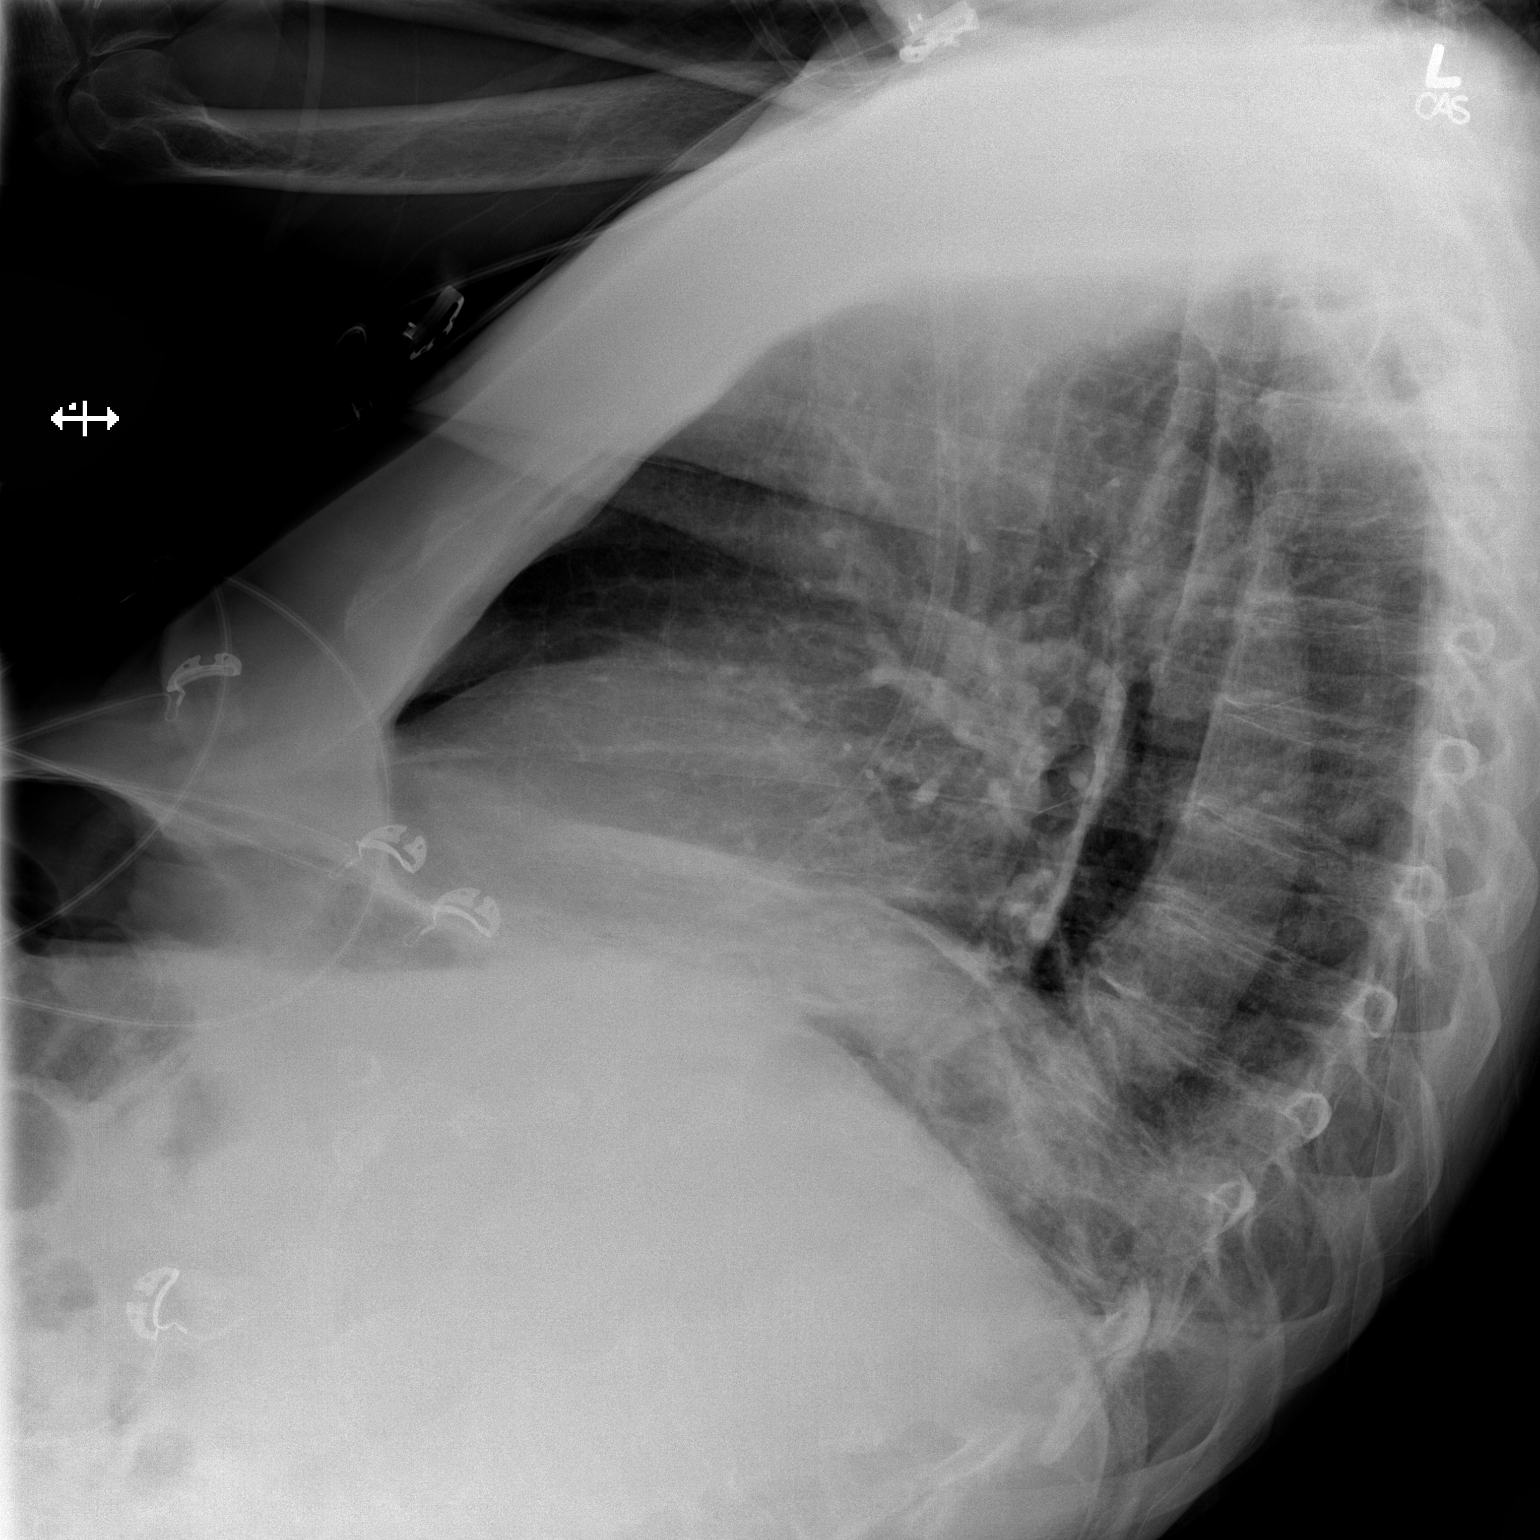

[x chest ap]
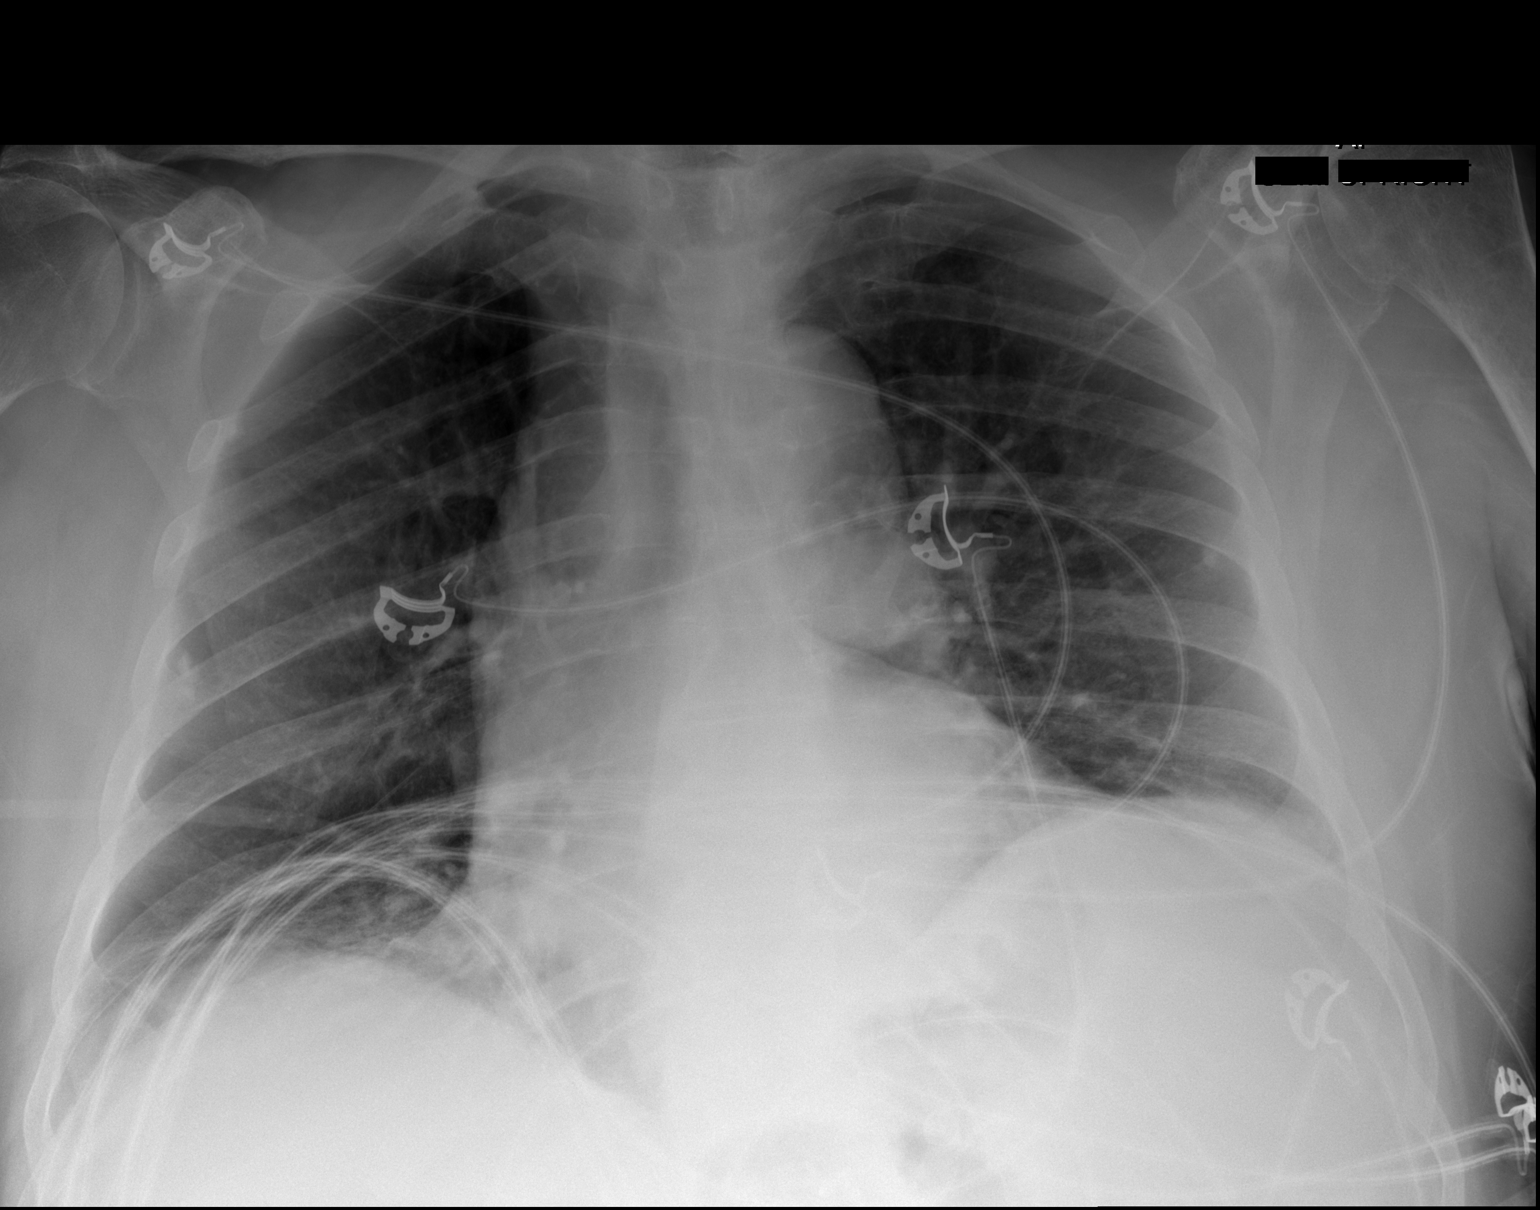

[2 of 2 positions shown; findings below may reference images not displayed]

FINDINGS: There are low lung volumes with mild bibasilar
atelectasis.  Calcified granulomas are present in both lungs.
There is no confluent airspace opacity or pleural effusion.  The
heart size is normal.  There is mild aortic atherosclerosis.
Multiple telemetry leads overlie the chest.  The subacromial space
of both shoulders is narrowed consistent with chronic rotator cuff
tears.
IMPRESSION: Bibasilar atelectasis.  No consolidation is demonstrated to suggest
pneumonia.

## 2013-03-05 IMAGING — CR DG CHEST 2V
2 series · 2 of 2 positions shown · non-contrast
Comparison: 05/17/2012

CLINICAL DATA: Short of breath.  Cough.  Weakness.  Prostate
carcinoma.

CHEST - 2 VIEW

[w chest pa]
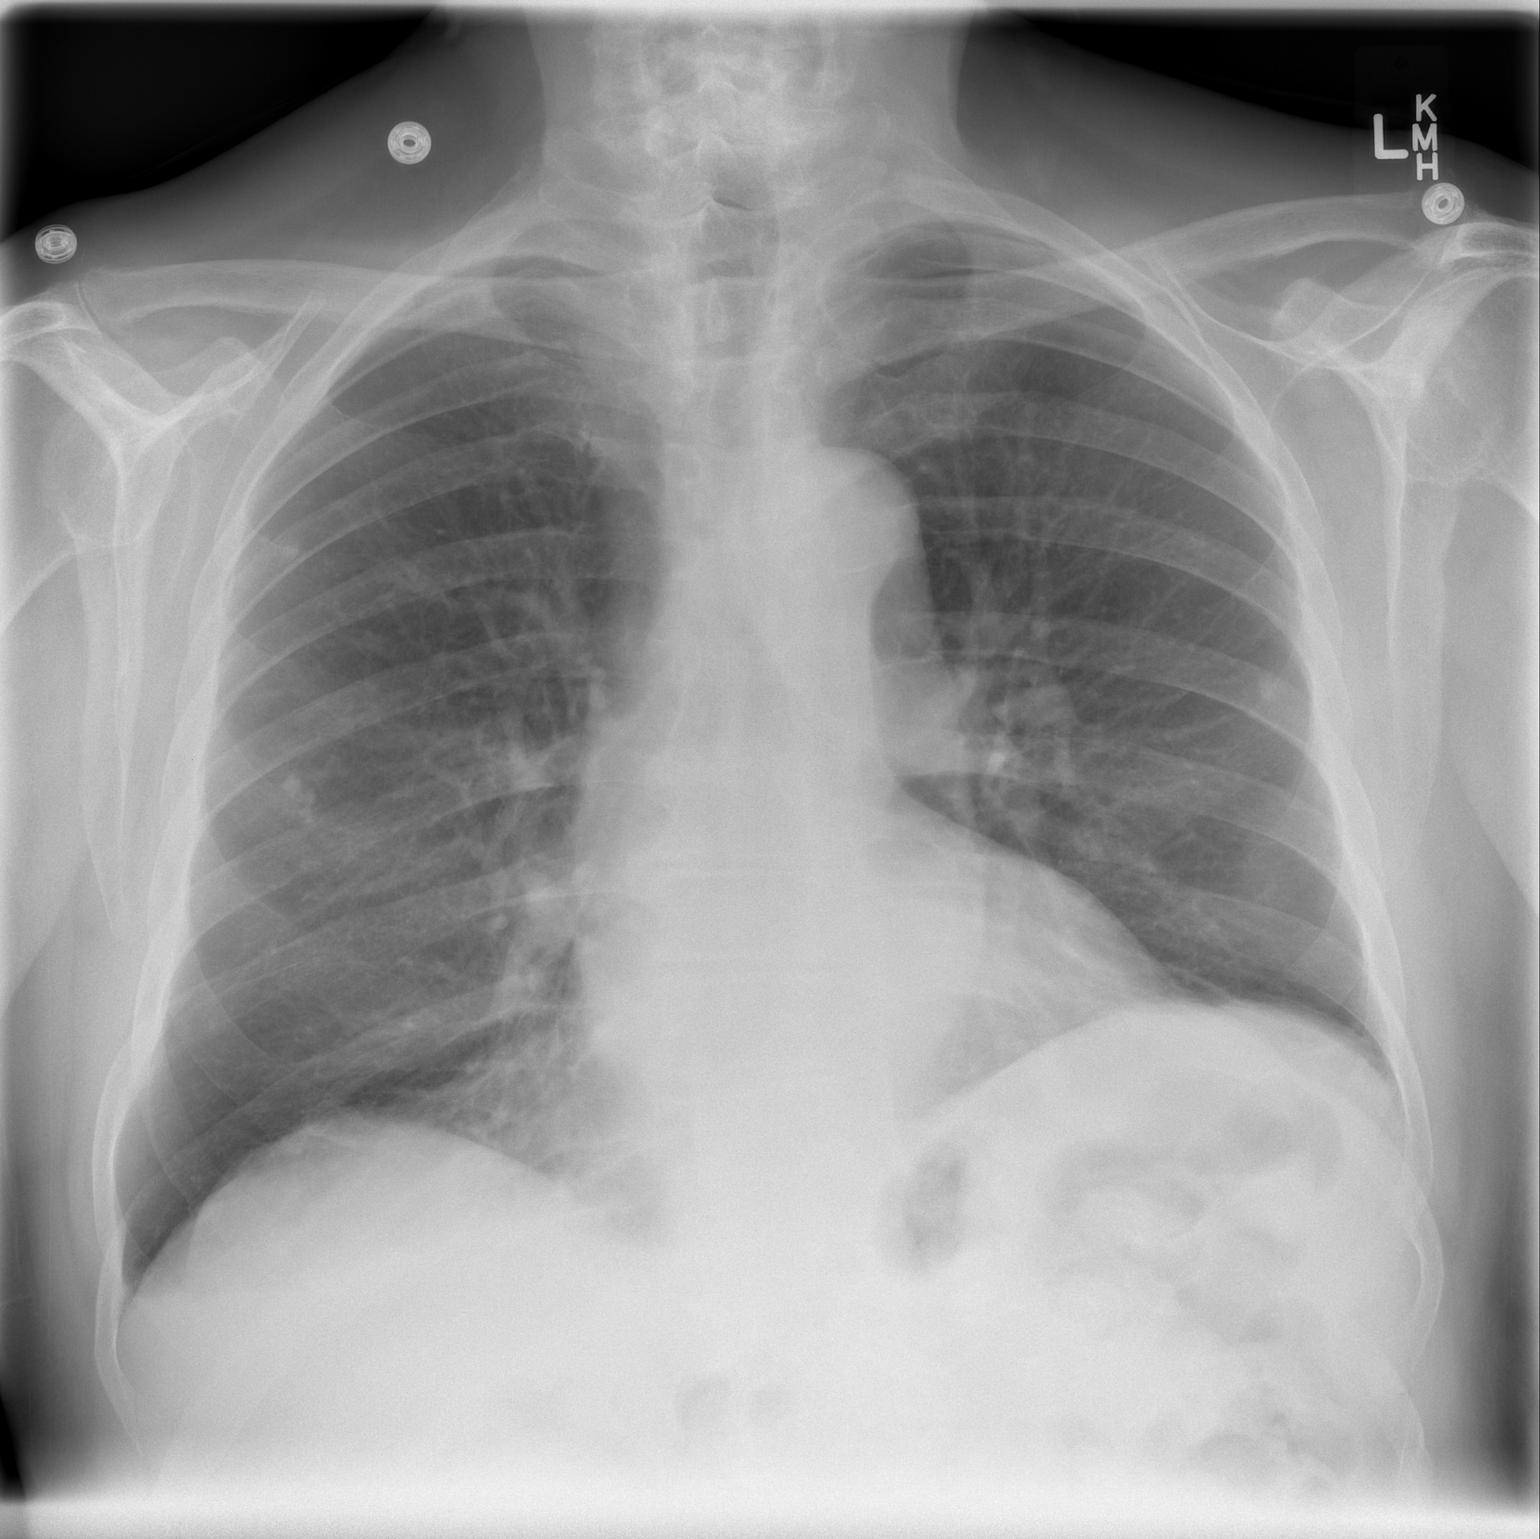

[w chest lat]
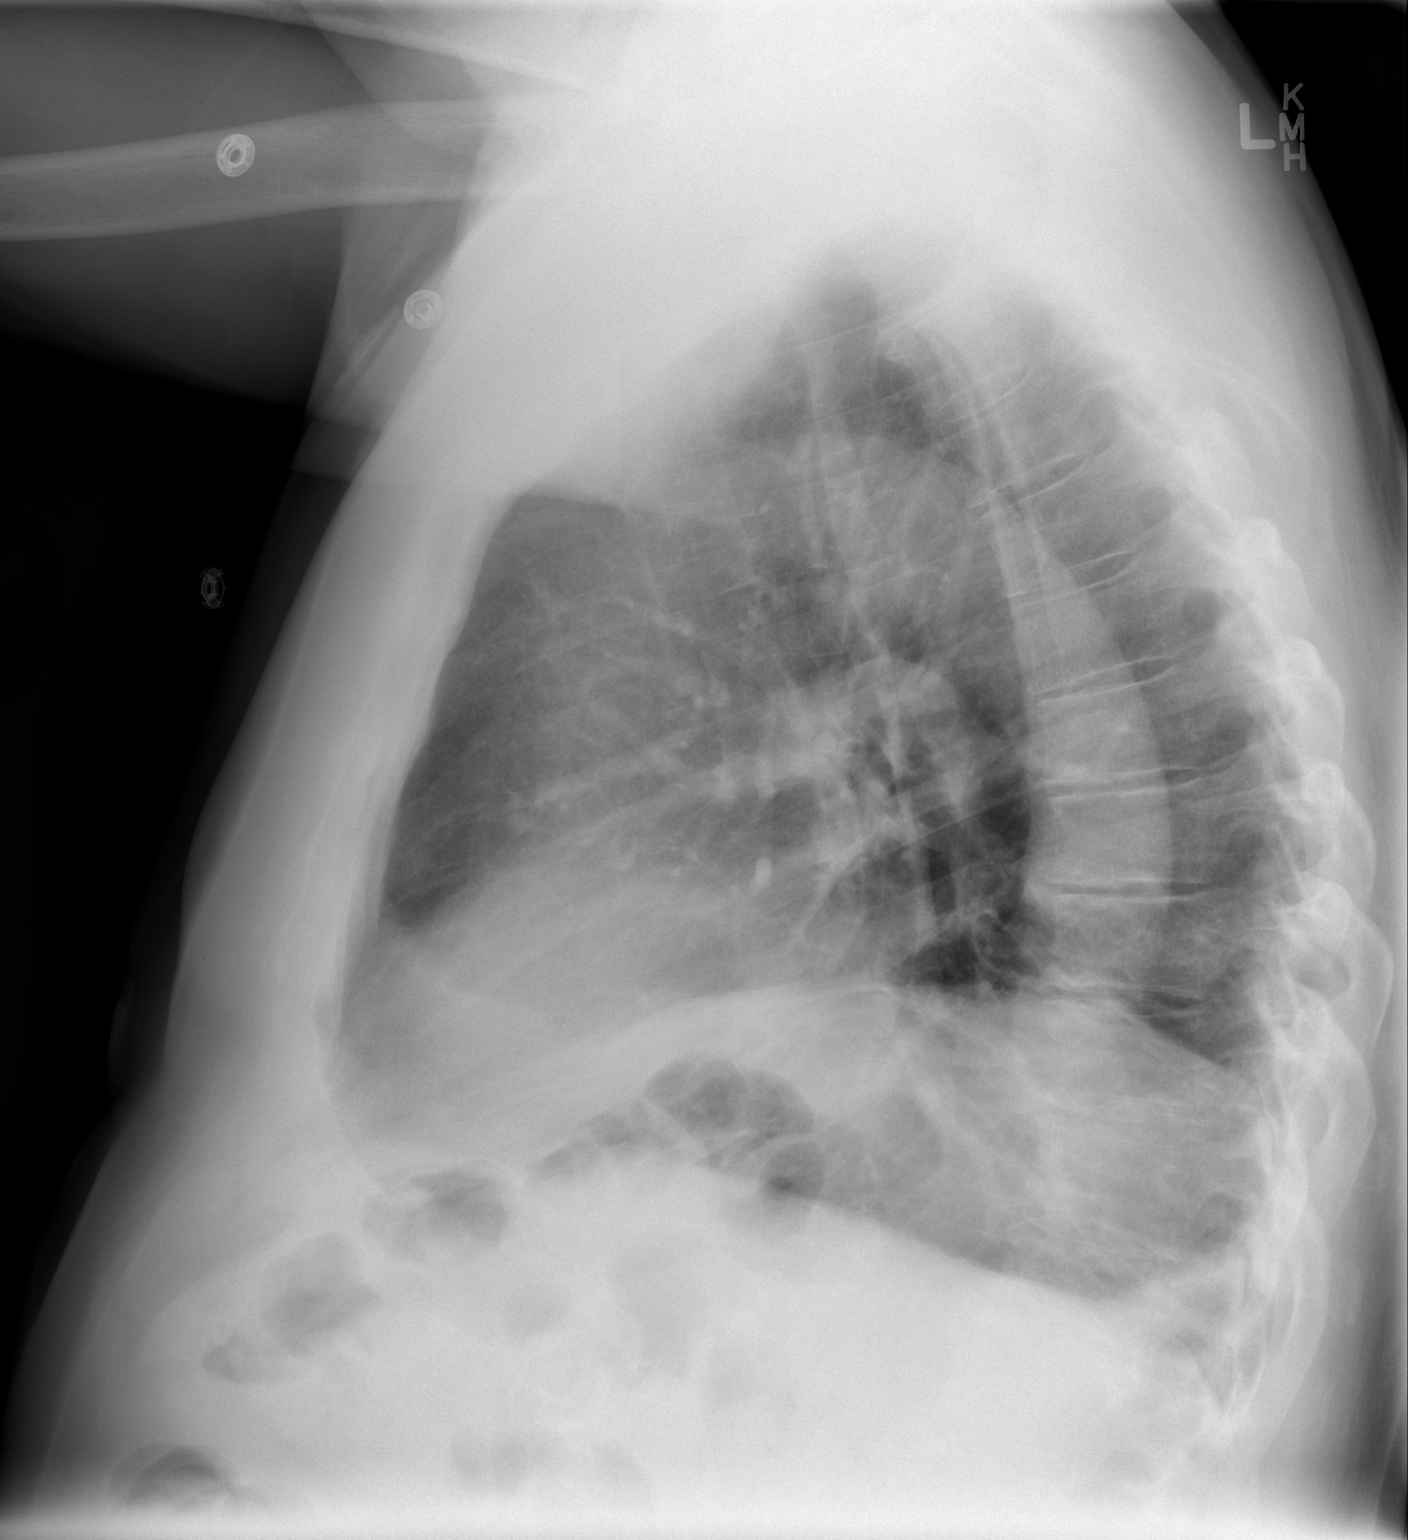

[2 of 2 positions shown; findings below may reference images not displayed]

FINDINGS: Improved aeration of both lungs is seen.  Both lungs are
clear. Tiny right posterior pleural effusion versus pleural
thickening noted.  Tiny calcified granulomata are seen bilaterally.
Heart size is within normal limits.  Ectasia of the thoracic aorta
remains stable. No definite mass or lymphadenopathy identified.
IMPRESSION: Resolving bibasilar atelectasis. Tiny right posterior pleural
effusion versus pleural thickening.

## 2013-04-09 ENCOUNTER — Encounter: Payer: Self-pay | Admitting: Family Medicine

## 2013-04-09 ENCOUNTER — Ambulatory Visit (INDEPENDENT_AMBULATORY_CARE_PROVIDER_SITE_OTHER): Payer: Medicare Other | Admitting: Family Medicine

## 2013-04-09 VITALS — BP 140/86 | HR 79 | Temp 97.1°F | Resp 16 | Wt 199.0 lb

## 2013-04-09 DIAGNOSIS — R5381 Other malaise: Secondary | ICD-10-CM | POA: Diagnosis not present

## 2013-04-09 DIAGNOSIS — F329 Major depressive disorder, single episode, unspecified: Secondary | ICD-10-CM

## 2013-04-09 LAB — BASIC METABOLIC PANEL
BUN: 16 mg/dL (ref 6–23)
Calcium: 9.7 mg/dL (ref 8.4–10.5)
Creatinine, Ser: 0.8 mg/dL (ref 0.4–1.5)
GFR: 96.37 mL/min (ref >60.00)
Glucose, Bld: 84 mg/dL (ref 70–99)
Sodium: 140 mEq/L (ref 135–145)

## 2013-04-09 LAB — HEPATIC FUNCTION PANEL
ALT: 15 U/L (ref 0–53)
Albumin: 4.1 g/dL (ref 3.5–5.2)
Total Protein: 7 g/dL (ref 6.0–8.3)

## 2013-04-09 LAB — CBC WITH DIFFERENTIAL/PLATELET
Basophils Relative: 0.6 % (ref 0.0–3.0)
Eosinophils Relative: 1.4 % (ref 0.0–5.0)
HCT: 41.5 % (ref 39.0–52.0)
Hemoglobin: 14.2 g/dL (ref 13.0–17.0)
Lymphs Abs: 1.8 10*3/uL (ref 0.7–4.0)
MCV: 94.7 fl (ref 78.0–100.0)
Monocytes Absolute: 0.9 10*3/uL (ref 0.1–1.0)
Neutro Abs: 4.8 10*3/uL (ref 1.4–7.7)
Platelets: 182 10*3/uL (ref 150.0–400.0)
WBC: 7.6 10*3/uL (ref 4.5–10.5)

## 2013-04-09 MED ORDER — SERTRALINE HCL 50 MG PO TABS: 50.0000 mg | ORAL_TABLET | Freq: Every day | ORAL | Status: DC

## 2013-04-09 NOTE — Assessment & Plan Note (Signed)
Unchanged.  Mood has improved since starting Zoloft but energy level has not.  Increase to 50mg  daily.  Will follow.

## 2013-04-09 NOTE — Patient Instructions (Signed)
Follow up 2-3 months to recheck mood Increase the Zoloft to 50mg - 2 of what you have at home, 1 of the new prescription Start over the counter multi-vitamin daily and add daily B12 supplement We'll notify you of your lab results and make any changes if needed Call with any questions or concerns Happy Thanksgiving!

## 2013-04-09 NOTE — Progress Notes (Signed)
  Subjective:    Patient ID: Trevor Galloway, male    DOB: Mar 18, 1927, 79 y.o.   MRN: [id number]  HPI Pre visit review using our clinic review tool, if applicable. No additional management support is needed unless otherwise documented below in the visit note.   Depression- wife reports pt has 'no energy', 'he just wants to sit'.  Complains, 'i don't feel good just about every day'.  Pt denies pain.  'i just don't want to get up and go like i used to'.  Pt reports he'd like to do things, 'let's fix it'.  Loose stools- 'not diarrhea, just loose'.  Anywhere from 1-3x/day.  Initially felt the loose stools were coming from the Lasix so they stopped this.  No edema.  Some improvement in stools.   Review of Systems For ROS see HPI     Objective:   Physical Exam  Vitals reviewed. Constitutional: He is oriented to person, place, and time. He appears well-developed and well-nourished. No distress.  HENT:  Head: Normocephalic and atraumatic.  Eyes: Conjunctivae and EOM are normal. Pupils are equal, round, and reactive to light.  Neck: Normal range of motion. Neck supple. No thyromegaly present.  Cardiovascular: Normal rate, regular rhythm, normal heart sounds and intact distal pulses.   No murmur heard. Pulmonary/Chest: Effort normal and breath sounds normal. No respiratory distress.  Abdominal: Soft. Bowel sounds are normal. He exhibits no distension.  Musculoskeletal: He exhibits no edema.  Lymphadenopathy:    He has no cervical adenopathy.  Neurological: He is alert and oriented to person, place, and time. No cranial nerve deficit.  Skin: Skin is warm and dry.  Psychiatric: He has a normal mood and affect. His behavior is normal.          Assessment & Plan:

## 2013-04-09 NOTE — Assessment & Plan Note (Signed)
Ongoing problem for pt despite normal lab work previously.  Repeat labs to look for metabolic cause.  Suspect there is a component of depression.  Will increase Zoloft to 50mg  and monitor for improvement.  Will follow closely.

## 2013-04-15 ENCOUNTER — Encounter: Payer: Self-pay | Admitting: General Practice

## 2013-04-25 DIAGNOSIS — M25539 Pain in unspecified wrist: Secondary | ICD-10-CM | POA: Diagnosis not present

## 2013-05-01 DIAGNOSIS — H353190 Nonexudative age-related macular degeneration, unspecified eye, stage unspecified: Secondary | ICD-10-CM | POA: Diagnosis not present

## 2013-05-01 DIAGNOSIS — H43819 Vitreous degeneration, unspecified eye: Secondary | ICD-10-CM | POA: Diagnosis not present

## 2013-05-01 DIAGNOSIS — H35379 Puckering of macula, unspecified eye: Secondary | ICD-10-CM | POA: Diagnosis not present

## 2013-05-06 DIAGNOSIS — M25539 Pain in unspecified wrist: Secondary | ICD-10-CM | POA: Diagnosis not present

## 2013-05-06 DIAGNOSIS — M19049 Primary osteoarthritis, unspecified hand: Secondary | ICD-10-CM | POA: Diagnosis not present

## 2013-05-30 DIAGNOSIS — C61 Malignant neoplasm of prostate: Secondary | ICD-10-CM | POA: Diagnosis not present

## 2013-06-11 DIAGNOSIS — H43819 Vitreous degeneration, unspecified eye: Secondary | ICD-10-CM | POA: Diagnosis not present

## 2013-06-11 DIAGNOSIS — H353190 Nonexudative age-related macular degeneration, unspecified eye, stage unspecified: Secondary | ICD-10-CM | POA: Diagnosis not present

## 2013-06-11 DIAGNOSIS — H35379 Puckering of macula, unspecified eye: Secondary | ICD-10-CM | POA: Diagnosis not present

## 2013-06-17 ENCOUNTER — Ambulatory Visit (INDEPENDENT_AMBULATORY_CARE_PROVIDER_SITE_OTHER): Payer: Medicare Other | Admitting: Family Medicine

## 2013-06-17 ENCOUNTER — Encounter: Payer: Self-pay | Admitting: Family Medicine

## 2013-06-17 VITALS — BP 140/80 | HR 80 | Temp 98.4°F | Resp 16 | Wt 179.0 lb

## 2013-06-17 DIAGNOSIS — F329 Major depressive disorder, single episode, unspecified: Secondary | ICD-10-CM | POA: Diagnosis not present

## 2013-06-17 DIAGNOSIS — Z9181 History of falling: Secondary | ICD-10-CM | POA: Diagnosis not present

## 2013-06-17 DIAGNOSIS — F3289 Other specified depressive episodes: Secondary | ICD-10-CM | POA: Diagnosis not present

## 2013-06-17 DIAGNOSIS — F32A Depression, unspecified: Secondary | ICD-10-CM

## 2013-06-17 DIAGNOSIS — R296 Repeated falls: Secondary | ICD-10-CM | POA: Insufficient documentation

## 2013-06-17 NOTE — Assessment & Plan Note (Addendum)
Ongoing.  This is a complicated issue for pt.  He is frustrated by his lack of independence and mobility but is tired of getting yelled at by his wife when he tries to do things.  He also gets yelled at when he doesn't do things.  Spoke w/ wife today and asked her to recognize that she is asking to have things both ways and putting him in an un-win-able position.  Wife agreed to try and change.  Pt, despite what wife says, reports feeling well and does appear to be happier today.  Total time spent w/ pt- 30 min, >50% spent counseling.  Will continue to follow.

## 2013-06-17 NOTE — Assessment & Plan Note (Signed)
New to provider.  I was unaware that pt was falling at home.  Pt is currently ambulating w/ cane but stressed that he should use his walker for better stability.  Will refer for home health assessment and possibly PT to improve strength and mobility- hopefully to improve his independence.  Will follow closely.

## 2013-06-17 NOTE — Progress Notes (Signed)
   Subjective:    Patient ID: Trevor Galloway, male    DOB: 01/09/1927, 80 y.o.   MRN: [id number]  HPI Depression- when asked how are you feeling, he responds 'pretty good'.  Wife immediately screams, 'he doesn't feel good.  Ever'.  Pt has torn rotator cuff after recent fall and is now ambulating w/ cane due to bilateral knee arthritis.  Pt is frustrated by lack of mobility and independence- 'but every time i try and do something, she's fussing at me'.  Wife is frustrated by his lack of participation and will frequently yell at him- but admits that she yells at him if he gets up and tries to do things.  Multiple falls- pt has injured his rotator cuff and having difficulty ambulating safely in his environment.  Frustrated by lack of mobility.  Pt currently using a cane but does have a walker available.   Review of Systems For ROS see HPI     Objective:   Physical Exam  Vitals reviewed. Constitutional: He is oriented to person, place, and time. He appears well-developed and well-nourished. No distress.  Cardiovascular: Intact distal pulses.   Neurological: He is alert and oriented to person, place, and time. Coordination (shuffling, antalgic gait) abnormal.  Skin: Skin is warm and dry.  Psychiatric: He has a normal mood and affect. His behavior is normal. Thought content normal.          Assessment & Plan:

## 2013-06-17 NOTE — Assessment & Plan Note (Signed)
>>  ASSESSMENT AND PLAN FOR FREQUENT FALLS WRITTEN ON 06/17/2013  5:25 PM BY Sheliah Hatch, MD  New to provider.  I was unaware that pt was falling at home.  Pt is currently ambulating w/ cane but stressed that he should use his walker for better stability.  Will refer for home health assessment and possibly PT to improve strength and mobility- hopefully to improve his independence.  Will follow closely.

## 2013-06-17 NOTE — Progress Notes (Signed)
Pre visit review using our clinic review tool, if applicable. No additional management support is needed unless otherwise documented below in the visit note. 

## 2013-06-17 NOTE — Patient Instructions (Signed)
Follow up in 3 months to recheck falls We'll call you with your home health referral Continue the Zoloft daily Try and use your walker rather than your cane Call with any questions or concerns Happy Valentine's Day!

## 2013-06-19 DIAGNOSIS — F329 Major depressive disorder, single episode, unspecified: Secondary | ICD-10-CM | POA: Diagnosis not present

## 2013-06-19 DIAGNOSIS — R262 Difficulty in walking, not elsewhere classified: Secondary | ICD-10-CM | POA: Diagnosis not present

## 2013-06-19 DIAGNOSIS — Z8546 Personal history of malignant neoplasm of prostate: Secondary | ICD-10-CM | POA: Diagnosis not present

## 2013-06-19 DIAGNOSIS — F3289 Other specified depressive episodes: Secondary | ICD-10-CM | POA: Diagnosis not present

## 2013-06-19 DIAGNOSIS — Z9181 History of falling: Secondary | ICD-10-CM | POA: Diagnosis not present

## 2013-06-19 DIAGNOSIS — M6281 Muscle weakness (generalized): Secondary | ICD-10-CM | POA: Diagnosis not present

## 2013-06-24 DIAGNOSIS — F3289 Other specified depressive episodes: Secondary | ICD-10-CM | POA: Diagnosis not present

## 2013-06-24 DIAGNOSIS — F329 Major depressive disorder, single episode, unspecified: Secondary | ICD-10-CM | POA: Diagnosis not present

## 2013-06-24 DIAGNOSIS — M6281 Muscle weakness (generalized): Secondary | ICD-10-CM | POA: Diagnosis not present

## 2013-06-24 DIAGNOSIS — Z9181 History of falling: Secondary | ICD-10-CM | POA: Diagnosis not present

## 2013-06-24 DIAGNOSIS — R262 Difficulty in walking, not elsewhere classified: Secondary | ICD-10-CM | POA: Diagnosis not present

## 2013-06-24 DIAGNOSIS — Z8546 Personal history of malignant neoplasm of prostate: Secondary | ICD-10-CM | POA: Diagnosis not present

## 2013-06-25 ENCOUNTER — Telehealth: Payer: Self-pay | Admitting: *Deleted

## 2013-06-25 DIAGNOSIS — I10 Essential (primary) hypertension: Secondary | ICD-10-CM

## 2013-06-25 DIAGNOSIS — M6281 Muscle weakness (generalized): Secondary | ICD-10-CM | POA: Diagnosis not present

## 2013-06-25 DIAGNOSIS — Z9181 History of falling: Secondary | ICD-10-CM | POA: Diagnosis not present

## 2013-06-25 DIAGNOSIS — F3289 Other specified depressive episodes: Secondary | ICD-10-CM | POA: Diagnosis not present

## 2013-06-25 DIAGNOSIS — R296 Repeated falls: Secondary | ICD-10-CM

## 2013-06-25 DIAGNOSIS — R262 Difficulty in walking, not elsewhere classified: Secondary | ICD-10-CM | POA: Diagnosis not present

## 2013-06-25 DIAGNOSIS — Z8546 Personal history of malignant neoplasm of prostate: Secondary | ICD-10-CM | POA: Diagnosis not present

## 2013-06-25 DIAGNOSIS — F329 Major depressive disorder, single episode, unspecified: Secondary | ICD-10-CM | POA: Diagnosis not present

## 2013-06-25 NOTE — Telephone Encounter (Addendum)
Patient's daughter, Arbie Cookey , is calling again in regards to the handicap sticker an order for a walker to see if it was completed yet. She would like for [place] to give her a call after 2:30pm

## 2013-06-25 NOTE — Telephone Encounter (Signed)
Patient's daughter, Arbie Cookey, called and stated that patient needs a handicap sticker and an order for a walker that he can push to help walk and that has a seat on it it as well. Please advise.   Carol's CB#: [phone]

## 2013-06-25 NOTE — Telephone Encounter (Signed)
Nelva Nay, patient's daughter, is requesting a handicap sticker for the patient/wife.  Patient's wife is the primary driver for the patient and the daughter would like for the patient to be able to park closer to buildings considering the patient's current status.  The patient has bilateral knee arthritis, ambulates with a cane and falls frequently.  The daughter is also asking if an order can be written for the patient to receive a roller walker with a seat.    Please advise.

## 2013-06-25 NOTE — Telephone Encounter (Signed)
Spoke with patient concerning daughter's request for handicap sticker and walker since there was no release of information form on file.  Patient stated that Trevor Galloway is his daughter, and that is was okay for me to "do whatever you have to do."

## 2013-06-25 NOTE — Telephone Encounter (Signed)
Ok to fill out handicapped parking placard.  HH evaluation was ordered- typically they recommend DME.  Pt and wife indicated at appt that he has a walker available for use.  Please check if pt actually requires a new walker.  If so, we can write script for 'rolling walker w/ seat'

## 2013-06-26 NOTE — Telephone Encounter (Signed)
Papers signed and placed at the front desk. Also script written for roller walker if pt would like.

## 2013-06-27 DIAGNOSIS — M6281 Muscle weakness (generalized): Secondary | ICD-10-CM | POA: Diagnosis not present

## 2013-06-27 DIAGNOSIS — F3289 Other specified depressive episodes: Secondary | ICD-10-CM | POA: Diagnosis not present

## 2013-06-27 DIAGNOSIS — R262 Difficulty in walking, not elsewhere classified: Secondary | ICD-10-CM | POA: Diagnosis not present

## 2013-06-27 DIAGNOSIS — Z9181 History of falling: Secondary | ICD-10-CM | POA: Diagnosis not present

## 2013-06-27 DIAGNOSIS — F329 Major depressive disorder, single episode, unspecified: Secondary | ICD-10-CM | POA: Diagnosis not present

## 2013-06-27 DIAGNOSIS — Z8546 Personal history of malignant neoplasm of prostate: Secondary | ICD-10-CM | POA: Diagnosis not present

## 2013-07-01 DIAGNOSIS — F3289 Other specified depressive episodes: Secondary | ICD-10-CM | POA: Diagnosis not present

## 2013-07-01 DIAGNOSIS — F329 Major depressive disorder, single episode, unspecified: Secondary | ICD-10-CM | POA: Diagnosis not present

## 2013-07-01 DIAGNOSIS — Z8546 Personal history of malignant neoplasm of prostate: Secondary | ICD-10-CM | POA: Diagnosis not present

## 2013-07-01 DIAGNOSIS — M6281 Muscle weakness (generalized): Secondary | ICD-10-CM | POA: Diagnosis not present

## 2013-07-01 DIAGNOSIS — Z9181 History of falling: Secondary | ICD-10-CM | POA: Diagnosis not present

## 2013-07-01 DIAGNOSIS — R262 Difficulty in walking, not elsewhere classified: Secondary | ICD-10-CM | POA: Diagnosis not present

## 2013-07-03 DIAGNOSIS — F329 Major depressive disorder, single episode, unspecified: Secondary | ICD-10-CM | POA: Diagnosis not present

## 2013-07-03 DIAGNOSIS — F3289 Other specified depressive episodes: Secondary | ICD-10-CM | POA: Diagnosis not present

## 2013-07-03 DIAGNOSIS — R262 Difficulty in walking, not elsewhere classified: Secondary | ICD-10-CM | POA: Diagnosis not present

## 2013-07-03 DIAGNOSIS — Z8546 Personal history of malignant neoplasm of prostate: Secondary | ICD-10-CM | POA: Diagnosis not present

## 2013-07-03 DIAGNOSIS — Z9181 History of falling: Secondary | ICD-10-CM | POA: Diagnosis not present

## 2013-07-03 DIAGNOSIS — M6281 Muscle weakness (generalized): Secondary | ICD-10-CM | POA: Diagnosis not present

## 2013-07-04 DIAGNOSIS — H35379 Puckering of macula, unspecified eye: Secondary | ICD-10-CM | POA: Diagnosis not present

## 2013-07-05 ENCOUNTER — Telehealth: Payer: Self-pay | Admitting: General Practice

## 2013-07-05 DIAGNOSIS — Z9181 History of falling: Secondary | ICD-10-CM | POA: Diagnosis not present

## 2013-07-05 DIAGNOSIS — H35379 Puckering of macula, unspecified eye: Secondary | ICD-10-CM | POA: Diagnosis not present

## 2013-07-05 DIAGNOSIS — F329 Major depressive disorder, single episode, unspecified: Secondary | ICD-10-CM | POA: Diagnosis not present

## 2013-07-05 DIAGNOSIS — Z8546 Personal history of malignant neoplasm of prostate: Secondary | ICD-10-CM | POA: Diagnosis not present

## 2013-07-05 DIAGNOSIS — F3289 Other specified depressive episodes: Secondary | ICD-10-CM | POA: Diagnosis not present

## 2013-07-05 DIAGNOSIS — M6281 Muscle weakness (generalized): Secondary | ICD-10-CM

## 2013-07-05 DIAGNOSIS — R262 Difficulty in walking, not elsewhere classified: Secondary | ICD-10-CM | POA: Diagnosis not present

## 2013-07-05 NOTE — Telephone Encounter (Signed)
Home health Certification papers received on 07/05/12. Charge sheet attached and placed in Tabori's signature folder.

## 2013-07-08 NOTE — Telephone Encounter (Signed)
Received signed paperwork. Faxed paperwork to [facility]. Billing sheet placed in interoffice folder. JG//CMA

## 2013-07-09 DIAGNOSIS — Z9181 History of falling: Secondary | ICD-10-CM | POA: Diagnosis not present

## 2013-07-09 DIAGNOSIS — Z8546 Personal history of malignant neoplasm of prostate: Secondary | ICD-10-CM | POA: Diagnosis not present

## 2013-07-09 DIAGNOSIS — F329 Major depressive disorder, single episode, unspecified: Secondary | ICD-10-CM | POA: Diagnosis not present

## 2013-07-09 DIAGNOSIS — R262 Difficulty in walking, not elsewhere classified: Secondary | ICD-10-CM | POA: Diagnosis not present

## 2013-07-09 DIAGNOSIS — F3289 Other specified depressive episodes: Secondary | ICD-10-CM | POA: Diagnosis not present

## 2013-07-09 DIAGNOSIS — M6281 Muscle weakness (generalized): Secondary | ICD-10-CM | POA: Diagnosis not present

## 2013-07-11 DIAGNOSIS — Z8546 Personal history of malignant neoplasm of prostate: Secondary | ICD-10-CM | POA: Diagnosis not present

## 2013-07-11 DIAGNOSIS — F329 Major depressive disorder, single episode, unspecified: Secondary | ICD-10-CM | POA: Diagnosis not present

## 2013-07-11 DIAGNOSIS — R262 Difficulty in walking, not elsewhere classified: Secondary | ICD-10-CM | POA: Diagnosis not present

## 2013-07-11 DIAGNOSIS — Z9181 History of falling: Secondary | ICD-10-CM | POA: Diagnosis not present

## 2013-07-11 DIAGNOSIS — M6281 Muscle weakness (generalized): Secondary | ICD-10-CM | POA: Diagnosis not present

## 2013-07-11 DIAGNOSIS — F3289 Other specified depressive episodes: Secondary | ICD-10-CM | POA: Diagnosis not present

## 2013-07-15 DIAGNOSIS — R262 Difficulty in walking, not elsewhere classified: Secondary | ICD-10-CM | POA: Diagnosis not present

## 2013-07-15 DIAGNOSIS — Z9181 History of falling: Secondary | ICD-10-CM | POA: Diagnosis not present

## 2013-07-15 DIAGNOSIS — M6281 Muscle weakness (generalized): Secondary | ICD-10-CM | POA: Diagnosis not present

## 2013-07-15 DIAGNOSIS — F3289 Other specified depressive episodes: Secondary | ICD-10-CM | POA: Diagnosis not present

## 2013-07-15 DIAGNOSIS — Z8546 Personal history of malignant neoplasm of prostate: Secondary | ICD-10-CM | POA: Diagnosis not present

## 2013-07-15 DIAGNOSIS — F329 Major depressive disorder, single episode, unspecified: Secondary | ICD-10-CM | POA: Diagnosis not present

## 2013-07-16 DIAGNOSIS — R262 Difficulty in walking, not elsewhere classified: Secondary | ICD-10-CM | POA: Diagnosis not present

## 2013-07-16 DIAGNOSIS — F3289 Other specified depressive episodes: Secondary | ICD-10-CM | POA: Diagnosis not present

## 2013-07-16 DIAGNOSIS — Z8546 Personal history of malignant neoplasm of prostate: Secondary | ICD-10-CM | POA: Diagnosis not present

## 2013-07-16 DIAGNOSIS — Z9181 History of falling: Secondary | ICD-10-CM | POA: Diagnosis not present

## 2013-07-16 DIAGNOSIS — M6281 Muscle weakness (generalized): Secondary | ICD-10-CM | POA: Diagnosis not present

## 2013-07-16 DIAGNOSIS — F329 Major depressive disorder, single episode, unspecified: Secondary | ICD-10-CM | POA: Diagnosis not present

## 2013-08-02 DIAGNOSIS — H35379 Puckering of macula, unspecified eye: Secondary | ICD-10-CM | POA: Diagnosis not present

## 2013-09-06 ENCOUNTER — Other Ambulatory Visit: Payer: Self-pay | Admitting: Family Medicine

## 2013-09-06 NOTE — Telephone Encounter (Signed)
Med filled.  

## 2013-09-16 ENCOUNTER — Encounter: Payer: Self-pay | Admitting: Family Medicine

## 2013-09-16 ENCOUNTER — Ambulatory Visit (INDEPENDENT_AMBULATORY_CARE_PROVIDER_SITE_OTHER): Payer: Medicare Other | Admitting: Family Medicine

## 2013-09-16 ENCOUNTER — Encounter: Payer: Self-pay | Admitting: General Practice

## 2013-09-16 ENCOUNTER — Other Ambulatory Visit: Payer: Self-pay | Admitting: General Practice

## 2013-09-16 VITALS — BP 142/84 | HR 69 | Temp 98.2°F | Resp 16 | Wt 188.0 lb

## 2013-09-16 DIAGNOSIS — R000 Tachycardia, unspecified: Secondary | ICD-10-CM

## 2013-09-16 DIAGNOSIS — Z9181 History of falling: Secondary | ICD-10-CM

## 2013-09-16 DIAGNOSIS — I10 Essential (primary) hypertension: Secondary | ICD-10-CM

## 2013-09-16 DIAGNOSIS — E785 Hyperlipidemia, unspecified: Secondary | ICD-10-CM | POA: Diagnosis not present

## 2013-09-16 DIAGNOSIS — R2689 Other abnormalities of gait and mobility: Secondary | ICD-10-CM | POA: Insufficient documentation

## 2013-09-16 DIAGNOSIS — R296 Repeated falls: Secondary | ICD-10-CM

## 2013-09-16 DIAGNOSIS — R269 Unspecified abnormalities of gait and mobility: Secondary | ICD-10-CM

## 2013-09-16 LAB — BASIC METABOLIC PANEL
BUN: 16 mg/dL (ref 6–23)
CALCIUM: 9.9 mg/dL (ref 8.4–10.5)
CO2: 25 mEq/L (ref 19–32)
Chloride: 106 mEq/L (ref 96–112)
Creatinine, Ser: 0.9 mg/dL (ref 0.4–1.5)
GFR: 91.06 mL/min (ref >60.00)
GLUCOSE: 88 mg/dL (ref 70–99)
Potassium: 4 mEq/L (ref 3.5–5.1)
SODIUM: 139 meq/L (ref 135–145)

## 2013-09-16 MED ORDER — METOPROLOL SUCCINATE ER 50 MG PO TB24: 50.0000 mg | ORAL_TABLET | Freq: Every day | ORAL | Status: DC

## 2013-09-16 MED ORDER — HYDROCHLOROTHIAZIDE 12.5 MG PO TABS: 12.5000 mg | ORAL_TABLET | Freq: Every day | ORAL | Status: DC

## 2013-09-16 NOTE — Patient Instructions (Signed)
Follow up in 6-8 weeks to recheck BP Start the Hydrochlorothiazide in addition to metoprolol We'll call you with your neuro appt for the shuffling and falls Call with any questions or concerns Hang in there!!!

## 2013-09-16 NOTE — Assessment & Plan Note (Signed)
Ongoing issue for pt.  Difficult to determine whether his falls are due to his shuffling gait or if there's another process at play.  Refer to neuro for complete evaluation.

## 2013-09-16 NOTE — Progress Notes (Signed)
Pre visit review using our clinic review tool, if applicable. No additional management support is needed unless otherwise documented below in the visit note. 

## 2013-09-16 NOTE — Progress Notes (Signed)
   Subjective:    Patient ID: Trevor Galloway, male    DOB: May 01, 1927, 80 y.o.   MRN: [id number]  HPI Frequent falls- ongoing issue for pt.  Wife and pt report he has not fallen recently.  Wife doesn't think HH was beneficial b/c pt won't do the prescribed exercises.  Wife reports ever since the fall in Oct he has been 'shuffling his feet'.  Pt has known knee arthritis.  HTN- chronic problem, wife reports home BPs will be higher than reading in office today.  Stopped the Lasix due to urinary frequency.  No CP, SOB, HAs, vision changes.  Ongoing mild LE edema.   Review of Systems For ROS see HPI     Objective:   Physical Exam  Vitals reviewed. Constitutional: He is oriented to person, place, and time. He appears well-developed and well-nourished. No distress.  HENT:  Head: Normocephalic and atraumatic.  Neck: Normal range of motion. Neck supple. No thyromegaly present.  Cardiovascular: Normal rate, regular rhythm and normal heart sounds.   Pulmonary/Chest: Effort normal and breath sounds normal. No respiratory distress. He has no wheezes. He has no rales.  Musculoskeletal: He exhibits edema (1+ edema bilaterally).  Neurological: He is alert and oriented to person, place, and time.  Shuffling, antalgic gait          Assessment & Plan:

## 2013-09-16 NOTE — Assessment & Plan Note (Signed)
Chronic problem.  BP is mildly elevated today but wife reports it gets higher than that at home.  Stopped Lasix due to urinary frequency.  Add HCTZ for better BP and edema control.  Check BMP.

## 2013-09-16 NOTE — Assessment & Plan Note (Signed)
New.  Pt has known hx of OA in knees bilaterally.  Unclear whether pt is shuffling to avoid pain or if there's a neurologic cause.  Refer to neuro for complete evaluation.

## 2013-09-16 NOTE — Assessment & Plan Note (Signed)
Chronic problem, attempting to control w/ diet.  Not exercising.  Check labs- start meds prn.

## 2013-09-16 NOTE — Assessment & Plan Note (Signed)
>>  ASSESSMENT AND PLAN FOR FREQUENT FALLS WRITTEN ON 09/16/2013 12:04 PM BY Sheliah Hatch, MD  Ongoing issue for pt.  Difficult to determine whether his falls are due to his shuffling gait or if there's another process at play.  Refer to neuro for complete evaluation.

## 2013-09-17 ENCOUNTER — Telehealth: Payer: Self-pay | Admitting: Family Medicine

## 2013-09-17 NOTE — Telephone Encounter (Signed)
Relevant patient education mailed to patient.  

## 2013-09-23 ENCOUNTER — Ambulatory Visit (INDEPENDENT_AMBULATORY_CARE_PROVIDER_SITE_OTHER): Payer: Medicare Other | Admitting: Neurology

## 2013-09-23 ENCOUNTER — Encounter: Payer: Self-pay | Admitting: Neurology

## 2013-09-23 VITALS — BP 152/82 | HR 72 | Resp 16 | Ht 70.0 in | Wt 204.0 lb

## 2013-09-23 DIAGNOSIS — G609 Hereditary and idiopathic neuropathy, unspecified: Secondary | ICD-10-CM

## 2013-09-23 DIAGNOSIS — G629 Polyneuropathy, unspecified: Secondary | ICD-10-CM

## 2013-09-23 LAB — FOLATE: Folate: >20 ng/mL

## 2013-09-23 LAB — VITAMIN B12: Vitamin B-12: 468 pg/mL (ref 211–911)

## 2013-09-23 NOTE — Patient Instructions (Addendum)
1. Your provider has requested that you have labwork completed today. Please go to [facility] on the first floor of this building before leaving the office today. 2. We would like you to have a two hour glucose tolerance test. This test is performed at [facility] located at [facility] - basement level. Please call [phone] to arrange a convenient time to have testing completed.  3. You have been referred to [facility]. They will call you directly to schedule an appointment.  Please call [phone] if you do not hear from them.  4. Follow up in 4 months.
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
## 2013-09-24 LAB — RPR

## 2013-09-25 LAB — UIFE/LIGHT CHAINS/TP QN, 24-HR UR
ALPHA 1 UR: DETECTED — AB
Albumin, U: DETECTED
Alpha 2, Urine: DETECTED — AB
BETA UR: DETECTED — AB
Free Kappa Lt Chains,Ur: 1.18 mg/dL (ref 0.14–2.42)
Free Kappa/Lambda Ratio: 9.08 ratio (ref 2.04–10.37)
Free Lambda Lt Chains,Ur: 0.13 mg/dL (ref 0.02–0.67)
GAMMA UR: DETECTED — AB
Total Protein, Urine: 1.8 mg/dL

## 2013-09-25 LAB — SPEP & IFE WITH QIG
ALBUMIN ELP: 62.8 % (ref 55.8–66.1)
Alpha-1-Globulin: 4.1 % (ref 2.9–4.9)
Alpha-2-Globulin: 11 % (ref 7.1–11.8)
Beta 2: 5.1 % (ref 3.2–6.5)
Beta Globulin: 5.6 % (ref 4.7–7.2)
Gamma Globulin: 11.4 % (ref 11.1–18.8)
IGA: 234 mg/dL (ref 68–379)
IGG (IMMUNOGLOBIN G), SERUM: 809 mg/dL (ref 650–1600)
IGM, SERUM: 65 mg/dL (ref 41–251)
TOTAL PROTEIN, SERUM ELECTROPHOR: 6.5 g/dL (ref 6.0–8.3)

## 2013-10-02 ENCOUNTER — Ambulatory Visit: Payer: Medicare Other | Attending: Neurology | Admitting: Physical Therapy

## 2013-10-02 DIAGNOSIS — Z9181 History of falling: Secondary | ICD-10-CM | POA: Diagnosis not present

## 2013-10-02 DIAGNOSIS — R262 Difficulty in walking, not elsewhere classified: Secondary | ICD-10-CM | POA: Insufficient documentation

## 2013-10-02 DIAGNOSIS — R269 Unspecified abnormalities of gait and mobility: Secondary | ICD-10-CM | POA: Diagnosis not present

## 2013-10-02 DIAGNOSIS — IMO0001 Reserved for inherently not codable concepts without codable children: Secondary | ICD-10-CM | POA: Diagnosis not present

## 2013-10-09 ENCOUNTER — Telehealth: Payer: Self-pay | Admitting: Neurology

## 2013-10-09 ENCOUNTER — Ambulatory Visit: Payer: Medicare Other | Admitting: Physical Therapy

## 2013-10-09 DIAGNOSIS — G629 Polyneuropathy, unspecified: Secondary | ICD-10-CM

## 2013-10-09 DIAGNOSIS — IMO0001 Reserved for inherently not codable concepts without codable children: Secondary | ICD-10-CM | POA: Diagnosis not present

## 2013-10-09 NOTE — Telephone Encounter (Signed)
Pt's daughter, Arbie Cookey called requesting to speak to a nurse regarding his dad's medication as well As his diabetic test he is scheduled to have.  C/b  [phone]

## 2013-10-09 NOTE — Telephone Encounter (Signed)
Patient did not want to have glucose tolerance test. Stressed the importance of test and they will keep appt. Would like a prescription for a 4 pronged cane. RX written and at front for pick up.

## 2013-10-10 ENCOUNTER — Telehealth: Payer: Self-pay | Admitting: *Deleted

## 2013-10-10 ENCOUNTER — Telehealth: Payer: Self-pay | Admitting: Neurology

## 2013-10-10 NOTE — Telephone Encounter (Signed)
Pt's daughter came in and wanted to request for the script to be sent to [facility] due to [facility] [employer] paying instead of sending to the pharmacist. Please call pt's daughter at [phone]

## 2013-10-10 NOTE — Telephone Encounter (Signed)
Left message on machine for patient's daughter to call back. TO make her aware RX not sent to pharmacy - at the front desk for pick up. They can give to home health agency when they pick it up.

## 2013-10-10 NOTE — Telephone Encounter (Signed)
Patient daughter called and ask about 4 prong cane where we thought it would be cheaper I gave her the number  To [employer] and [employer] discount supplies . They are still going to do the Glucose testing

## 2013-10-14 ENCOUNTER — Ambulatory Visit: Payer: Medicare Other | Attending: Neurology | Admitting: Physical Therapy

## 2013-10-14 DIAGNOSIS — IMO0001 Reserved for inherently not codable concepts without codable children: Secondary | ICD-10-CM | POA: Diagnosis not present

## 2013-10-14 DIAGNOSIS — R269 Unspecified abnormalities of gait and mobility: Secondary | ICD-10-CM | POA: Diagnosis not present

## 2013-10-14 DIAGNOSIS — R262 Difficulty in walking, not elsewhere classified: Secondary | ICD-10-CM | POA: Insufficient documentation

## 2013-10-14 DIAGNOSIS — Z9181 History of falling: Secondary | ICD-10-CM | POA: Insufficient documentation

## 2013-10-17 ENCOUNTER — Encounter: Payer: Self-pay | Admitting: Neurology

## 2013-10-17 ENCOUNTER — Ambulatory Visit: Payer: Medicare Other | Admitting: Physical Therapy

## 2013-10-17 DIAGNOSIS — IMO0001 Reserved for inherently not codable concepts without codable children: Secondary | ICD-10-CM | POA: Diagnosis not present

## 2013-10-21 ENCOUNTER — Ambulatory Visit: Payer: Medicare Other | Admitting: Physical Therapy

## 2013-10-21 DIAGNOSIS — IMO0001 Reserved for inherently not codable concepts without codable children: Secondary | ICD-10-CM | POA: Diagnosis not present

## 2013-10-23 ENCOUNTER — Ambulatory Visit: Payer: Medicare Other | Admitting: Physical Therapy

## 2013-10-23 DIAGNOSIS — IMO0001 Reserved for inherently not codable concepts without codable children: Secondary | ICD-10-CM | POA: Diagnosis not present

## 2013-10-28 ENCOUNTER — Ambulatory Visit: Payer: Medicare Other | Admitting: Physical Therapy

## 2013-10-28 DIAGNOSIS — IMO0001 Reserved for inherently not codable concepts without codable children: Secondary | ICD-10-CM | POA: Diagnosis not present

## 2013-10-29 ENCOUNTER — Other Ambulatory Visit: Payer: Self-pay | Admitting: *Deleted

## 2013-10-29 ENCOUNTER — Telehealth: Payer: Self-pay | Admitting: Neurology

## 2013-10-29 ENCOUNTER — Other Ambulatory Visit (INDEPENDENT_AMBULATORY_CARE_PROVIDER_SITE_OTHER): Payer: Medicare Other

## 2013-10-29 ENCOUNTER — Encounter: Payer: Self-pay | Admitting: General Practice

## 2013-10-29 DIAGNOSIS — E785 Hyperlipidemia, unspecified: Secondary | ICD-10-CM

## 2013-10-29 DIAGNOSIS — G609 Hereditary and idiopathic neuropathy, unspecified: Secondary | ICD-10-CM

## 2013-10-29 DIAGNOSIS — R7309 Other abnormal glucose: Secondary | ICD-10-CM | POA: Diagnosis not present

## 2013-10-29 LAB — LIPID PANEL
CHOLESTEROL: 192 mg/dL (ref 0–200)
HDL: 40.4 mg/dL (ref >39.00)
LDL Cholesterol: 119 mg/dL — ABNORMAL HIGH (ref 0–99)
NonHDL: 151.6
TRIGLYCERIDES: 162 mg/dL — AB (ref 0.0–149.0)
Total CHOL/HDL Ratio: 5
VLDL: 32.4 mg/dL (ref 0.0–40.0)

## 2013-10-29 LAB — GLUCOSE TOLERANCE, 2 HOURS
GLUCOSE 1 HOUR GTT: 222 mg/dL
GLUCOSE, FASTING: 101 mg/dL — AB (ref 70–99)
Glucose, 2 hour: 139 mg/dL

## 2013-10-29 NOTE — Telephone Encounter (Signed)
Patient made aware glucose test abnormal and we need additional blood work. Order entered and they will have drawn at [facility].

## 2013-10-29 NOTE — Telephone Encounter (Signed)
2 hour GTT just slightly abnormal.  Order HgbA1C

## 2013-10-30 ENCOUNTER — Ambulatory Visit: Payer: Medicare Other | Admitting: Physical Therapy

## 2013-10-30 DIAGNOSIS — IMO0001 Reserved for inherently not codable concepts without codable children: Secondary | ICD-10-CM | POA: Diagnosis not present

## 2013-10-30 LAB — HEMOGLOBIN A1C
Hgb A1c MFr Bld: 5.9 % — ABNORMAL HIGH
Mean Plasma Glucose: 123 mg/dL — ABNORMAL HIGH

## 2013-10-31 ENCOUNTER — Telehealth: Payer: Self-pay | Admitting: Neurology

## 2013-10-31 NOTE — Telephone Encounter (Signed)
Left message with patient's wife for patient to call back. To make him aware HgB A1C is elevated showing signs of pre-diabetes. Per Dr Posey Pronto - Patient should follow up with PCP and go on a low carb/low sugar diet. Awaiting call back to make patient aware.

## 2013-10-31 NOTE — Telephone Encounter (Signed)
Patient made aware. He has an appt with his PCP next week.

## 2013-10-31 NOTE — Telephone Encounter (Signed)
Pt called/returing your call at 12:39PM C/b [phone]

## 2013-11-01 DIAGNOSIS — H353190 Nonexudative age-related macular degeneration, unspecified eye, stage unspecified: Secondary | ICD-10-CM | POA: Diagnosis not present

## 2013-11-01 DIAGNOSIS — H35379 Puckering of macula, unspecified eye: Secondary | ICD-10-CM | POA: Diagnosis not present

## 2013-11-01 DIAGNOSIS — Z961 Presence of intraocular lens: Secondary | ICD-10-CM | POA: Diagnosis not present

## 2013-11-01 DIAGNOSIS — H35359 Cystoid macular degeneration, unspecified eye: Secondary | ICD-10-CM | POA: Diagnosis not present

## 2013-11-04 ENCOUNTER — Encounter: Payer: Self-pay | Admitting: Family Medicine

## 2013-11-04 ENCOUNTER — Encounter: Payer: Self-pay | Admitting: General Practice

## 2013-11-04 ENCOUNTER — Ambulatory Visit (INDEPENDENT_AMBULATORY_CARE_PROVIDER_SITE_OTHER): Payer: Medicare Other | Admitting: Family Medicine

## 2013-11-04 VITALS — BP 140/80 | HR 79 | Temp 98.4°F | Resp 16 | Wt 201.1 lb

## 2013-11-04 DIAGNOSIS — I10 Essential (primary) hypertension: Secondary | ICD-10-CM | POA: Diagnosis not present

## 2013-11-04 DIAGNOSIS — R296 Repeated falls: Secondary | ICD-10-CM

## 2013-11-04 DIAGNOSIS — Z9181 History of falling: Secondary | ICD-10-CM | POA: Diagnosis not present

## 2013-11-04 LAB — BASIC METABOLIC PANEL
BUN: 22 mg/dL (ref 6–23)
CALCIUM: 10 mg/dL (ref 8.4–10.5)
CO2: 27 mEq/L (ref 19–32)
CREATININE: 0.8 mg/dL (ref 0.4–1.5)
Chloride: 105 mEq/L (ref 96–112)
GFR: 93.56 mL/min (ref >60.00)
GLUCOSE: 98 mg/dL (ref 70–99)
Potassium: 3.7 mEq/L (ref 3.5–5.1)
Sodium: 139 mEq/L (ref 135–145)

## 2013-11-04 NOTE — Assessment & Plan Note (Signed)
Chronic problem.  Better control today.  Asymptomatic.  Check BMP due to addition of HCTZ.  No anticipated med changes.

## 2013-11-04 NOTE — Patient Instructions (Signed)
Schedule your complete physical in 3 months Your blood pressure looks good- no changes Make sure you tell PT that you are still having trouble shuffling around the house- either switching your cane or using the walker (which I love!) may be helpful We'll notify you of your lab results and make any changes if needed Call with any questions or concerns Hang in there!

## 2013-11-04 NOTE — Assessment & Plan Note (Signed)
>>  ASSESSMENT AND PLAN FOR FREQUENT FALLS WRITTEN ON 11/04/2013 12:57 PM BY Sheliah Hatch, MD  Ongoing problem for pt.  [place] Se[place] again last week, thankfully no injuries.  Encouraged wife and pt to discuss their concerns w/ PT- wife reports he walks w/ long strides and holds himself upright while supervised and then reverts to his stooped, shuffling walk at home.  Pt is not comfortable w/ his 4 point cane- has 2 other canes at home.  Encouraged him to bring all canes and walker to PT tomorrow so they can help decide which is most appropriate for him to use.  Pt expressed understanding and is in agreement w/ plan.

## 2013-11-04 NOTE — Progress Notes (Signed)
Pre visit review using our clinic review tool, if applicable. No additional management support is needed unless otherwise documented below in the visit note. 

## 2013-11-04 NOTE — Progress Notes (Signed)
   Subjective:    Patient ID: Trevor Galloway, male    DOB: 1927-03-18, 80 y.o.   MRN: [id number]  HPI HTN- chronic problem, on Metoprolol and HCTZ.  BP has improved since last visit (152/82).  Denies CP, SOB, HAs, visual changes, edema  Gait instability- pt has seen neuro and they feel this is due to diffuse peripheral neuropathy.  Is doing physical therapy and pt and wife don't feel this is helpful.  Pt had a fall last week- thankfully did not injure himself.  'i think i got my feet mixed up'.   Review of Systems For ROS see HPI     Objective:   Physical Exam  Vitals reviewed. Constitutional: He is oriented to person, place, and time. He appears well-developed and well-nourished. No distress.  HENT:  Head: Normocephalic and atraumatic.  Cardiovascular: Normal rate, regular rhythm, normal heart sounds and intact distal pulses.   Pulmonary/Chest: Effort normal and breath sounds normal. No respiratory distress. He has no wheezes. He has no rales.  Musculoskeletal: He exhibits edema (1+ pitting edema bilaterally).  Neurological: He is alert and oriented to person, place, and time.  Slow, shuffling gait  Skin: Skin is warm and dry.  Psychiatric:  Flat affect          Assessment & Plan:

## 2013-11-04 NOTE — Assessment & Plan Note (Signed)
Ongoing problem for pt.  [place] again last week, thankfully no injuries.  Encouraged wife and pt to discuss their concerns w/ PT- wife reports he walks w/ long strides and holds himself upright while supervised and then reverts to his stooped, shuffling walk at home.  Pt is not comfortable w/ his 4 point cane- has 2 other canes at home.  Encouraged him to bring all canes and walker to PT tomorrow so they can help decide which is most appropriate for him to use.  Pt expressed understanding and is in agreement w/ plan.

## 2013-11-05 ENCOUNTER — Ambulatory Visit: Payer: Medicare Other | Admitting: Physical Therapy

## 2013-11-05 DIAGNOSIS — IMO0001 Reserved for inherently not codable concepts without codable children: Secondary | ICD-10-CM | POA: Diagnosis not present

## 2013-11-07 ENCOUNTER — Ambulatory Visit: Payer: Medicare Other | Admitting: Physical Therapy

## 2013-11-07 DIAGNOSIS — IMO0001 Reserved for inherently not codable concepts without codable children: Secondary | ICD-10-CM | POA: Diagnosis not present

## 2013-11-12 DIAGNOSIS — H35359 Cystoid macular degeneration, unspecified eye: Secondary | ICD-10-CM | POA: Diagnosis not present

## 2013-12-03 DIAGNOSIS — H35359 Cystoid macular degeneration, unspecified eye: Secondary | ICD-10-CM | POA: Diagnosis not present

## 2013-12-03 DIAGNOSIS — C61 Malignant neoplasm of prostate: Secondary | ICD-10-CM | POA: Diagnosis not present

## 2013-12-03 DIAGNOSIS — H353190 Nonexudative age-related macular degeneration, unspecified eye, stage unspecified: Secondary | ICD-10-CM | POA: Diagnosis not present

## 2013-12-03 DIAGNOSIS — H35379 Puckering of macula, unspecified eye: Secondary | ICD-10-CM | POA: Diagnosis not present

## 2014-01-24 ENCOUNTER — Ambulatory Visit (INDEPENDENT_AMBULATORY_CARE_PROVIDER_SITE_OTHER): Payer: Medicare Other | Admitting: Neurology

## 2014-01-24 ENCOUNTER — Encounter: Payer: Self-pay | Admitting: Neurology

## 2014-01-24 VITALS — BP 124/70 | HR 76 | Resp 16 | Ht 70.5 in | Wt 198.0 lb

## 2014-01-24 DIAGNOSIS — G609 Hereditary and idiopathic neuropathy, unspecified: Secondary | ICD-10-CM | POA: Diagnosis not present
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
## 2014-02-10 ENCOUNTER — Other Ambulatory Visit: Payer: Self-pay | Admitting: Family Medicine

## 2014-02-10 NOTE — Telephone Encounter (Signed)
Med filled.  

## 2014-02-17 ENCOUNTER — Encounter: Payer: Medicare Other | Admitting: Family Medicine

## 2014-02-24 ENCOUNTER — Encounter: Payer: Medicare Other | Admitting: Family Medicine

## 2014-02-28 DIAGNOSIS — H35372 Puckering of macula, left eye: Secondary | ICD-10-CM | POA: Diagnosis not present

## 2014-02-28 DIAGNOSIS — H35351 Cystoid macular degeneration, right eye: Secondary | ICD-10-CM | POA: Diagnosis not present

## 2014-02-28 DIAGNOSIS — H3531 Nonexudative age-related macular degeneration: Secondary | ICD-10-CM | POA: Diagnosis not present

## 2014-03-06 DIAGNOSIS — L570 Actinic keratosis: Secondary | ICD-10-CM | POA: Diagnosis not present

## 2014-03-06 DIAGNOSIS — Z85828 Personal history of other malignant neoplasm of skin: Secondary | ICD-10-CM | POA: Diagnosis not present

## 2014-03-06 DIAGNOSIS — Z08 Encounter for follow-up examination after completed treatment for malignant neoplasm: Secondary | ICD-10-CM | POA: Diagnosis not present

## 2014-03-06 DIAGNOSIS — D225 Melanocytic nevi of trunk: Secondary | ICD-10-CM | POA: Diagnosis not present

## 2014-03-25 ENCOUNTER — Encounter: Payer: Self-pay | Admitting: Family Medicine

## 2014-03-25 ENCOUNTER — Ambulatory Visit (INDEPENDENT_AMBULATORY_CARE_PROVIDER_SITE_OTHER): Payer: Medicare Other | Admitting: Family Medicine

## 2014-03-25 VITALS — BP 122/72 | HR 70 | Temp 98.1°F | Resp 16 | Ht 69.0 in | Wt 198.1 lb

## 2014-03-25 DIAGNOSIS — F32A Depression, unspecified: Secondary | ICD-10-CM

## 2014-03-25 DIAGNOSIS — R000 Tachycardia, unspecified: Secondary | ICD-10-CM

## 2014-03-25 DIAGNOSIS — I10 Essential (primary) hypertension: Secondary | ICD-10-CM | POA: Diagnosis not present

## 2014-03-25 DIAGNOSIS — E785 Hyperlipidemia, unspecified: Secondary | ICD-10-CM

## 2014-03-25 DIAGNOSIS — F329 Major depressive disorder, single episode, unspecified: Secondary | ICD-10-CM

## 2014-03-25 DIAGNOSIS — Z0000 Encounter for general adult medical examination without abnormal findings: Secondary | ICD-10-CM | POA: Diagnosis not present

## 2014-03-25 DIAGNOSIS — Z23 Encounter for immunization: Secondary | ICD-10-CM | POA: Diagnosis not present

## 2014-03-25 LAB — LIPID PANEL
CHOL/HDL RATIO: 6
Cholesterol: 218 mg/dL — ABNORMAL HIGH (ref 0–200)
HDL: 35.6 mg/dL — AB (ref >39.00)
LDL CALC: 145 mg/dL — AB (ref 0–99)
NonHDL: 182.4
Triglycerides: 189 mg/dL — ABNORMAL HIGH (ref 0.0–149.0)
VLDL: 37.8 mg/dL (ref 0.0–40.0)

## 2014-03-25 LAB — BASIC METABOLIC PANEL
BUN: 17 mg/dL (ref 6–23)
CO2: 22 meq/L (ref 19–32)
CREATININE: 0.9 mg/dL (ref 0.4–1.5)
Calcium: 9.4 mg/dL (ref 8.4–10.5)
Chloride: 108 mEq/L (ref 96–112)
GFR: 89.72 mL/min (ref >60.00)
Glucose, Bld: 99 mg/dL (ref 70–99)
Potassium: 4 mEq/L (ref 3.5–5.1)
Sodium: 141 mEq/L (ref 135–145)

## 2014-03-25 LAB — HEPATIC FUNCTION PANEL
ALT: 19 U/L (ref 0–53)
AST: 26 U/L (ref 0–37)
Albumin: 3.5 g/dL (ref 3.5–5.2)
Alkaline Phosphatase: 80 U/L (ref 39–117)
Bilirubin, Direct: 0.1 mg/dL (ref 0.0–0.3)
Total Bilirubin: 0.7 mg/dL (ref 0.2–1.2)
Total Protein: 6.3 g/dL (ref 6.0–8.3)

## 2014-03-25 LAB — CBC WITH DIFFERENTIAL/PLATELET
Basophils Absolute: 0 10*3/uL (ref 0.0–0.1)
Basophils Relative: 0.4 % (ref 0.0–3.0)
EOS PCT: 1.3 % (ref 0.0–5.0)
Eosinophils Absolute: 0.1 10*3/uL (ref 0.0–0.7)
HCT: 41.6 % (ref 39.0–52.0)
Hemoglobin: 13.9 g/dL (ref 13.0–17.0)
LYMPHS PCT: 22.2 % (ref 12.0–46.0)
Lymphs Abs: 1.9 10*3/uL (ref 0.7–4.0)
MCHC: 33.4 g/dL (ref 30.0–36.0)
MCV: 96.6 fl (ref 78.0–100.0)
MONO ABS: 0.7 10*3/uL (ref 0.1–1.0)
Monocytes Relative: 8 % (ref 3.0–12.0)
NEUTROS PCT: 68.1 % (ref 43.0–77.0)
Neutro Abs: 5.7 10*3/uL (ref 1.4–7.7)
Platelets: 143 10*3/uL — ABNORMAL LOW (ref 150.0–400.0)
RBC: 4.31 Mil/uL (ref 4.22–5.81)
RDW: 14 % (ref 11.5–15.5)
WBC: 8.3 10*3/uL (ref 4.0–10.5)

## 2014-03-25 LAB — TSH: TSH: 3.35 u[IU]/mL (ref 0.35–4.50)

## 2014-03-25 MED ORDER — HYDROCHLOROTHIAZIDE 12.5 MG PO TABS: 12.5000 mg | ORAL_TABLET | Freq: Every day | ORAL | Status: DC

## 2014-03-25 MED ORDER — SERTRALINE HCL 50 MG PO TABS: ORAL_TABLET | ORAL | Status: DC

## 2014-03-25 MED ORDER — METOPROLOL SUCCINATE ER 50 MG PO TB24: 50.0000 mg | ORAL_TABLET | Freq: Every day | ORAL | Status: DC

## 2014-03-25 NOTE — Patient Instructions (Signed)
Follow up in 6 months to recheck BP We'll notify you of your lab results and make any changes if needed Call with any questions or concerns Hang in there!  You're doing great!

## 2014-03-25 NOTE — Progress Notes (Signed)
Pre visit review using our clinic review tool, if applicable. No additional management support is needed unless otherwise documented below in the visit note. 

## 2014-03-25 NOTE — Progress Notes (Signed)
   Subjective:    Patient ID: Trevor Galloway, male    DOB: 02-03-1927, 80 y.o.   MRN: [id number]  HPI Here today for CPE.  Risk Factors: HTN- chronic problem, on HCTZ, Metoprolol. Hyperlipidemia- chronic problem, not currently on medication.  Attempting to control w/ healthy diet Prostate Cancer- chronic problem, on Lupron injxns w/ Dr Risa Grill Physical Activity: very limited Fall Risk: high risk, ambulating w/ cane, fell earlier this month Depression: chronic problem, on Sertraline.  Pt feels mood is stable Hearing: decreased to conversational tones and whispered voice at 6 ft ADL's: independent Cognitive: pt reports memory issues, normal linear thought process.  Pt has stopped driving. Home Safety: safe at home, lives w/ wife Height, Weight, BMI, Visual Acuity: see vitals, vision corrected to 20/20 w/ glasses Counseling: UTD on Urology (Dr Risa Grill), pt declines colonoscopy Labs Ordered: See A&P Care Plan: See A&P   Review of Systems Patient reports no vision/hearing changes, anorexia, fever ,adenopathy, persistant/recurrent hoarseness, swallowing issues, chest pain, palpitations, edema, persistant/recurrent cough, hemoptysis, dyspnea (rest,exertional, paroxysmal nocturnal), gastrointestinal  bleeding (melena, rectal bleeding), abdominal pain, excessive heart burn, GU symptoms (dysuria, hematuria, voiding/incontinence issues) syncope, focal weakness, numbness & tingling, skin/hair/nail changes, abnormal bruising/bleeding, musculoskeletal symptoms/signs.     Objective:   Physical Exam General Appearance:    Alert, cooperative, no distress, appears stated age  Head:    Normocephalic, without obvious abnormality, atraumatic  Eyes:    PERRL, conjunctiva/corneas clear, EOM's intact, fundi    benign, both eyes       Ears:    Normal TM's and external ear canals, both ears  Nose:   Nares normal, septum midline, mucosa normal, no drainage   or sinus tenderness  Throat:   Lips, mucosa, and  tongue normal; teeth and gums normal  Neck:   Supple, symmetrical, trachea midline, no adenopathy;       thyroid:  No enlargement/tenderness/nodules  Back:     Symmetric, no curvature, ROM normal, no CVA tenderness  Lungs:     Clear to auscultation bilaterally, respirations unlabored  Chest wall:    No tenderness or deformity  Heart:    Regular rate and rhythm, S1 and S2 normal, no murmur, rub   or gallop  Abdomen:     Soft, non-tender, bowel sounds active all four quadrants,    no masses, no organomegaly  Genitalia:    Deferred to urology  Rectal:    Extremities:   Extremities normal, atraumatic, no cyanosis or edema  Pulses:   2+ and symmetric all extremities  Skin:   Skin color, texture, turgor normal, no rashes or lesions  Lymph nodes:   Cervical, supraclavicular, and axillary nodes normal  Neurologic:   CNII-XII intact. Normal strength, sensation and reflexes      throughout          Assessment & Plan:

## 2014-03-27 ENCOUNTER — Telehealth: Payer: Self-pay | Admitting: Family Medicine

## 2014-03-27 NOTE — Telephone Encounter (Signed)
There is no reason that he can't take it but from what I've read, I'm not sure what the benefit would be (and it seems to be expensive)

## 2014-03-27 NOTE — Telephone Encounter (Signed)
Caller name: Arbie Cookey Relation to pt: daughter Call back number: [phone] Pharmacy:  Reason for call:   Patient daughter states that patient does not want to take lipitor because he loves grapefruit. Patient daughter wants to know if patient can take isotonix opc3?

## 2014-03-27 NOTE — Telephone Encounter (Signed)
Spoke with pt daughter and advised. She is in agreement that we can monitor the cholesterol and she will help make sure he gets a healthier diet.

## 2014-03-28 DIAGNOSIS — Z23 Encounter for immunization: Secondary | ICD-10-CM | POA: Diagnosis not present

## 2014-04-06 NOTE — Assessment & Plan Note (Signed)
Chronic problem.  Pt now on Sertraline.  Wife and daughter feel mood has improved.  Pt feels things are stable.  No changes at this time.  Will follow.

## 2014-04-06 NOTE — Assessment & Plan Note (Signed)
Pt's PE unchanged from previous.  Pt UTD on urology.  Declines colonoscopy.  Check labs.  Anticipatory guidance provided.

## 2014-04-06 NOTE — Assessment & Plan Note (Signed)
Chronic problem, adequate control.  Asymptomatic.  Check labs.  No anticipated med changes. 

## 2014-04-06 NOTE — Assessment & Plan Note (Signed)
Chronic problem.  Not currently on meds.  Has been attempting to control w/ healthy diet.  Check labs.  Discuss meds if needed.

## 2014-04-29 ENCOUNTER — Telehealth: Payer: Self-pay | Admitting: Family Medicine

## 2014-04-29 NOTE — Telephone Encounter (Signed)
Wife states patient has bilateral ankle and feet swelling.  Says ankles and feet are so swollen patient is unable to put on his shoes. States she started noticing the swelling yesterday, but that the swelling could have started before then.  Denied chest pain and shortness of breath.  Wife states they have cut back salt, but pt is not drinking much fluid.  Wife states she can't get him to drink.  He will only take 2-3 sips of water per day and will maybe have a cup of coffee.  Skin is dry. Urine described as amber in color--"like beer".  Wife states pt is taking all medications as prescribed.  She gave him lasix this morning to see if that would help.  Wife was advised not to self treat with lasix unless advised by a provider and to elevate patients legs while at rest.  An appointment was also scheduled with Dr. Birdie Riddle tomorrow (04/30/14) at 11:30 am.

## 2014-04-29 NOTE — Telephone Encounter (Signed)
Caller name: [place], Valli Relation to BR:AXEN  Call back number: [phone]   Reason for call:  Pt has swollen ankles requesting clinical advice.

## 2014-04-30 ENCOUNTER — Encounter: Payer: Self-pay | Admitting: Family Medicine

## 2014-04-30 ENCOUNTER — Ambulatory Visit (INDEPENDENT_AMBULATORY_CARE_PROVIDER_SITE_OTHER): Payer: Medicare Other | Admitting: Family Medicine

## 2014-04-30 VITALS — BP 138/82 | HR 72 | Temp 97.8°F | Resp 18 | Wt 200.4 lb

## 2014-04-30 DIAGNOSIS — R609 Edema, unspecified: Secondary | ICD-10-CM

## 2014-04-30 DIAGNOSIS — E785 Hyperlipidemia, unspecified: Secondary | ICD-10-CM | POA: Diagnosis not present

## 2014-04-30 DIAGNOSIS — I4892 Unspecified atrial flutter: Secondary | ICD-10-CM

## 2014-04-30 DIAGNOSIS — I10 Essential (primary) hypertension: Secondary | ICD-10-CM

## 2014-04-30 LAB — BRAIN NATRIURETIC PEPTIDE: Pro B Natriuretic peptide (BNP): 33 pg/mL (ref 0.0–100.0)

## 2014-04-30 LAB — BASIC METABOLIC PANEL
BUN: 19 mg/dL (ref 6–23)
CHLORIDE: 105 meq/L (ref 96–112)
CO2: 25 mEq/L (ref 19–32)
Calcium: 9.4 mg/dL (ref 8.4–10.5)
Creatinine, Ser: 0.9 mg/dL (ref 0.4–1.5)
GFR: 85.12 mL/min (ref >60.00)
GLUCOSE: 86 mg/dL (ref 70–99)
Potassium: 3.6 mEq/L (ref 3.5–5.1)
Sodium: 139 mEq/L (ref 135–145)

## 2014-04-30 NOTE — Progress Notes (Signed)
Pre visit review using our clinic review tool, if applicable. No additional management support is needed unless otherwise documented below in the visit note. 

## 2014-04-30 NOTE — Telephone Encounter (Signed)
Agree w/ advice given- will see today

## 2014-04-30 NOTE — Patient Instructions (Signed)
Follow up as scheduled/needed We'll notify you of your lab results and make any changes if needed Increase your water intake!!! Limit your salt intake Elevate your legs when you are seated Call with any questions or concerns Happy Holidays!!!

## 2014-04-30 NOTE — Progress Notes (Signed)
   Subjective:    Patient ID: Trevor Galloway, male    DOB: Jul 10, 1926, 79 y.o.   MRN: [id number]  HPI Edema- bilateral swelling of lower legs, feet, ankles.  Not painful.  Wife noticed on Monday.  Wife gave him a Lasix tab yesterday morning w/ increased urination but no improvement in swelling.  Legs are swollen when pt wakes in the AM.  Pt has very limited fluid intake.  Pt reports urine is bright yellow in color.     Review of Systems For ROS see HPI     Objective:   Physical Exam  Constitutional: He is oriented to person, place, and time. He appears well-developed and well-nourished. No distress.  HENT:  Head: Normocephalic and atraumatic.  Eyes: Conjunctivae and EOM are normal. Pupils are equal, round, and reactive to light.  Neck: Normal range of motion. Neck supple. No thyromegaly present.  Cardiovascular: Normal rate, regular rhythm, normal heart sounds and intact distal pulses.   No murmur heard. Pulmonary/Chest: Effort normal and breath sounds normal. No respiratory distress.  Abdominal: Soft. Bowel sounds are normal. He exhibits no distension.  Musculoskeletal: He exhibits edema (1+ pitting edema bilaterally).  Lymphadenopathy:    He has no cervical adenopathy.  Neurological: He is alert and oriented to person, place, and time. No cranial nerve deficit.  Skin: Skin is warm and dry.  Psychiatric: He has a normal mood and affect. His behavior is normal.  Vitals reviewed.         Assessment & Plan:

## 2014-05-02 ENCOUNTER — Encounter: Payer: Self-pay | Admitting: General Practice

## 2014-05-04 NOTE — Assessment & Plan Note (Signed)
Recurrent problem for pt.  Suspect this is due to very limited water intake in addition to high salt intake from process foods.  Pt was previously on Lasix w/ limited improvement in edema but excessive urination- which was felt to be a safety hazard w/ his abnormal gait.  Asked wife not to give him meds w/o discussing this w/ one of his providers first.  Check labs to r/o CHF in setting of hx of Aflutter but no evidence of crackles on lung exam.  Encouraged pt to increase his water intake.  Will follow closely.

## 2014-05-30 DIAGNOSIS — H35351 Cystoid macular degeneration, right eye: Secondary | ICD-10-CM | POA: Diagnosis not present

## 2014-05-30 DIAGNOSIS — H35372 Puckering of macula, left eye: Secondary | ICD-10-CM | POA: Diagnosis not present

## 2014-05-30 DIAGNOSIS — H3531 Nonexudative age-related macular degeneration: Secondary | ICD-10-CM | POA: Diagnosis not present

## 2014-05-30 DIAGNOSIS — H43813 Vitreous degeneration, bilateral: Secondary | ICD-10-CM | POA: Diagnosis not present

## 2014-06-04 ENCOUNTER — Telehealth: Payer: Self-pay | Admitting: Family Medicine

## 2014-06-04 NOTE — Telephone Encounter (Signed)
Spoke with patient's wife who stated he has been having 3 BM's/day for around 1 year.  She wonders if this could be caused by one of his medications (she suspects the multivitamin).  There is no blood, no dark stools, no recent antibiotics, and no signs of dehydration.  Please advise.     eal

## 2014-06-04 NOTE — Telephone Encounter (Signed)
Notified wife, stated understanding.  eal

## 2014-06-04 NOTE — Telephone Encounter (Signed)
Caller name:Carol Gonet Relation to NG:[id number] Call back number:[phone] Pharmacy:  Reason for call: daughter calling stating that she would like for you to call her mother regarding pt. States pt is having some diarrhea and using the bathroom more than normal and she is wanting to know if it is coming from a medicine that the pt is on.

## 2014-06-04 NOTE — Telephone Encounter (Signed)
3 BMs daily is not abnormal- particularly if it is unchanged x1 year.  There is nothing on med list that jumps out at me as possible cause.  This may just be his normal stooling pattern

## 2014-06-26 DIAGNOSIS — C61 Malignant neoplasm of prostate: Secondary | ICD-10-CM | POA: Diagnosis not present

## 2014-09-25 ENCOUNTER — Encounter: Payer: Self-pay | Admitting: Family Medicine

## 2014-09-25 ENCOUNTER — Ambulatory Visit (INDEPENDENT_AMBULATORY_CARE_PROVIDER_SITE_OTHER): Payer: Medicare Other | Admitting: Family Medicine

## 2014-09-25 VITALS — BP 140/80 | HR 74 | Temp 97.9°F | Resp 16 | Wt 198.1 lb

## 2014-09-25 DIAGNOSIS — E785 Hyperlipidemia, unspecified: Secondary | ICD-10-CM

## 2014-09-25 DIAGNOSIS — F32A Depression, unspecified: Secondary | ICD-10-CM

## 2014-09-25 DIAGNOSIS — F329 Major depressive disorder, single episode, unspecified: Secondary | ICD-10-CM | POA: Diagnosis not present

## 2014-09-25 DIAGNOSIS — R000 Tachycardia, unspecified: Secondary | ICD-10-CM | POA: Diagnosis not present

## 2014-09-25 DIAGNOSIS — I10 Essential (primary) hypertension: Secondary | ICD-10-CM | POA: Diagnosis not present

## 2014-09-25 LAB — LIPID PANEL
CHOLESTEROL: 191 mg/dL (ref 0–200)
HDL: 37.5 mg/dL — ABNORMAL LOW (ref >39.00)
LDL Cholesterol: 116 mg/dL — ABNORMAL HIGH (ref 0–99)
NonHDL: 153.5
TRIGLYCERIDES: 187 mg/dL — AB (ref 0.0–149.0)
Total CHOL/HDL Ratio: 5
VLDL: 37.4 mg/dL (ref 0.0–40.0)

## 2014-09-25 LAB — BASIC METABOLIC PANEL
BUN: 13 mg/dL (ref 6–23)
CO2: 29 mEq/L (ref 19–32)
Calcium: 9.7 mg/dL (ref 8.4–10.5)
Chloride: 105 mEq/L (ref 96–112)
Creatinine, Ser: 0.83 mg/dL (ref 0.40–1.50)
GFR: 93.37 mL/min (ref >60.00)
GLUCOSE: 96 mg/dL (ref 70–99)
POTASSIUM: 3.7 meq/L (ref 3.5–5.1)
Sodium: 138 mEq/L (ref 135–145)

## 2014-09-25 LAB — CBC WITH DIFFERENTIAL/PLATELET
BASOS PCT: 0.6 % (ref 0.0–3.0)
Basophils Absolute: 0.1 10*3/uL (ref 0.0–0.1)
Eosinophils Absolute: 0.1 10*3/uL (ref 0.0–0.7)
Eosinophils Relative: 1.1 % (ref 0.0–5.0)
HCT: 39.5 % (ref 39.0–52.0)
Hemoglobin: 13.7 g/dL (ref 13.0–17.0)
Lymphocytes Relative: 23.1 % (ref 12.0–46.0)
Lymphs Abs: 1.8 10*3/uL (ref 0.7–4.0)
MCHC: 34.8 g/dL (ref 30.0–36.0)
MCV: 92.3 fl (ref 78.0–100.0)
Monocytes Absolute: 0.8 10*3/uL (ref 0.1–1.0)
Monocytes Relative: 10.4 % (ref 3.0–12.0)
NEUTROS ABS: 5.1 10*3/uL (ref 1.4–7.7)
NEUTROS PCT: 64.8 % (ref 43.0–77.0)
Platelets: 200 10*3/uL (ref 150.0–400.0)
RBC: 4.28 Mil/uL (ref 4.22–5.81)
RDW: 14.5 % (ref 11.5–15.5)
WBC: 7.9 10*3/uL (ref 4.0–10.5)

## 2014-09-25 LAB — HEPATIC FUNCTION PANEL
ALT: 14 U/L (ref 0–53)
AST: 21 U/L (ref 0–37)
Albumin: 3.8 g/dL (ref 3.5–5.2)
Alkaline Phosphatase: 73 U/L (ref 39–117)
BILIRUBIN DIRECT: 0.1 mg/dL (ref 0.0–0.3)
TOTAL PROTEIN: 6.7 g/dL (ref 6.0–8.3)
Total Bilirubin: 0.5 mg/dL (ref 0.2–1.2)

## 2014-09-25 LAB — TSH: TSH: 3.29 u[IU]/mL (ref 0.35–4.50)

## 2014-09-25 MED ORDER — METOPROLOL SUCCINATE ER 50 MG PO TB24: 50.0000 mg | ORAL_TABLET | Freq: Every day | ORAL | Status: DC

## 2014-09-25 MED ORDER — HYDROCHLOROTHIAZIDE 12.5 MG PO TABS: 12.5000 mg | ORAL_TABLET | Freq: Every day | ORAL | Status: DC

## 2014-09-25 NOTE — Assessment & Plan Note (Signed)
Chronic problem.  Pt and daughter are resistant to starting meds given his age.  This is understandable.  Attempting to control w/ healthy diet.  No regular exercise.  Check labs.  Adjust tx plan prn.

## 2014-09-25 NOTE — Assessment & Plan Note (Signed)
Chronic problem.  Well controlled.  Currently asymptomatic.  Check labs.  No anticipated med changes.  Will follow. 

## 2014-09-25 NOTE — Patient Instructions (Signed)
Schedule your complete physical in 6 months We'll notify you of your lab results and make any changes if needed Decrease the Zoloft to 1/2 tab daily x2 weeks and then stop Call with any questions or concerns HAPPY BIRTHDAY!!!

## 2014-09-25 NOTE — Progress Notes (Signed)
Pre visit review using our clinic review tool, if applicable. No additional management support is needed unless otherwise documented below in the visit note. 

## 2014-09-25 NOTE — Progress Notes (Signed)
   Subjective:    Patient ID: Trevor Galloway, male    DOB: November 13, 1926, 81 y.o.   MRN: [id number]  HPI HTN- chronic problem, adequate control today on HCTZ, metoprolol.  Denies CP, SOB, HAs, visual changes.  No edema above baseline.  Hyperlipidemia- chronic problem, not currently on medications.  Attempting to control w/ healthy diet.  No regular exercise.  Denies abd pain, N/V.  Depression- pt wife's reports 'he's not depressed' and since he's been on the Zoloft 'he can't remember'.  Wife admits she has no patience w/ him or his 'slowness'.   Review of Systems For ROS see HPI     Objective:   Physical Exam  Constitutional: He is oriented to person, place, and time. He appears well-developed and well-nourished. No distress.  HENT:  Head: Normocephalic and atraumatic.  Eyes: Conjunctivae and EOM are normal. Pupils are equal, round, and reactive to light.  Neck: Normal range of motion. Neck supple. No thyromegaly present.  Cardiovascular: Normal rate, regular rhythm, normal heart sounds and intact distal pulses.   No murmur heard. Pulmonary/Chest: Effort normal and breath sounds normal. No respiratory distress.  Abdominal: Soft. Bowel sounds are normal. He exhibits no distension.  Musculoskeletal: He exhibits edema (trace edema bilaterally).  Lymphadenopathy:    He has no cervical adenopathy.  Neurological: He is alert and oriented to person, place, and time. No cranial nerve deficit.  Skin: Skin is warm and dry.  Psychiatric: He has a normal mood and affect. His behavior is normal.  Vitals reviewed.         Assessment & Plan:

## 2014-09-25 NOTE — Assessment & Plan Note (Signed)
Pt's wife denies depression.  Would like him to wean off medication b/c he is having some memory issues.  Unclear if this is due to SSRI or advancing age.  Weaning plan discussed w/ pt and family.  Will follow.

## 2014-09-26 ENCOUNTER — Encounter: Payer: Self-pay | Admitting: General Practice

## 2014-11-28 DIAGNOSIS — H35351 Cystoid macular degeneration, right eye: Secondary | ICD-10-CM | POA: Diagnosis not present

## 2014-11-28 DIAGNOSIS — H35372 Puckering of macula, left eye: Secondary | ICD-10-CM | POA: Diagnosis not present

## 2014-11-28 DIAGNOSIS — H43813 Vitreous degeneration, bilateral: Secondary | ICD-10-CM | POA: Diagnosis not present

## 2014-11-28 DIAGNOSIS — H3531 Nonexudative age-related macular degeneration: Secondary | ICD-10-CM | POA: Diagnosis not present

## 2014-12-09 DIAGNOSIS — H35351 Cystoid macular degeneration, right eye: Secondary | ICD-10-CM | POA: Diagnosis not present

## 2015-01-09 DIAGNOSIS — C61 Malignant neoplasm of prostate: Secondary | ICD-10-CM | POA: Diagnosis not present

## 2015-01-28 DIAGNOSIS — Z23 Encounter for immunization: Secondary | ICD-10-CM | POA: Diagnosis not present

## 2015-03-04 DIAGNOSIS — H353132 Nonexudative age-related macular degeneration, bilateral, intermediate dry stage: Secondary | ICD-10-CM | POA: Diagnosis not present

## 2015-03-04 DIAGNOSIS — H35372 Puckering of macula, left eye: Secondary | ICD-10-CM | POA: Diagnosis not present

## 2015-03-04 DIAGNOSIS — H43812 Vitreous degeneration, left eye: Secondary | ICD-10-CM | POA: Diagnosis not present

## 2015-03-04 DIAGNOSIS — H35351 Cystoid macular degeneration, right eye: Secondary | ICD-10-CM | POA: Diagnosis not present

## 2015-03-12 DIAGNOSIS — L821 Other seborrheic keratosis: Secondary | ICD-10-CM | POA: Diagnosis not present

## 2015-03-12 DIAGNOSIS — C44319 Basal cell carcinoma of skin of other parts of face: Secondary | ICD-10-CM | POA: Diagnosis not present

## 2015-03-27 ENCOUNTER — Telehealth: Payer: Self-pay

## 2015-03-30 ENCOUNTER — Ambulatory Visit (INDEPENDENT_AMBULATORY_CARE_PROVIDER_SITE_OTHER): Payer: Medicare Other | Admitting: Family Medicine

## 2015-03-30 ENCOUNTER — Encounter: Payer: Self-pay | Admitting: Family Medicine

## 2015-03-30 VITALS — BP 134/80 | HR 72 | Temp 97.9°F | Resp 16 | Ht 69.0 in | Wt 198.4 lb

## 2015-03-30 DIAGNOSIS — I4892 Unspecified atrial flutter: Secondary | ICD-10-CM | POA: Diagnosis not present

## 2015-03-30 DIAGNOSIS — E785 Hyperlipidemia, unspecified: Secondary | ICD-10-CM

## 2015-03-30 DIAGNOSIS — Z23 Encounter for immunization: Secondary | ICD-10-CM

## 2015-03-30 DIAGNOSIS — Z0000 Encounter for general adult medical examination without abnormal findings: Secondary | ICD-10-CM

## 2015-03-30 DIAGNOSIS — I10 Essential (primary) hypertension: Secondary | ICD-10-CM | POA: Diagnosis not present

## 2015-03-30 LAB — CBC WITH DIFFERENTIAL/PLATELET
Basophils Absolute: 0.1 10*3/uL (ref 0.0–0.1)
Basophils Relative: 0.7 % (ref 0.0–3.0)
EOS PCT: 1.5 % (ref 0.0–5.0)
Eosinophils Absolute: 0.1 10*3/uL (ref 0.0–0.7)
HEMATOCRIT: 42.8 % (ref 39.0–52.0)
Hemoglobin: 14.4 g/dL (ref 13.0–17.0)
LYMPHS ABS: 2.3 10*3/uL (ref 0.7–4.0)
Lymphocytes Relative: 27.7 % (ref 12.0–46.0)
MCHC: 33.6 g/dL (ref 30.0–36.0)
MCV: 95.9 fl (ref 78.0–100.0)
MONO ABS: 0.9 10*3/uL (ref 0.1–1.0)
Monocytes Relative: 11.2 % (ref 3.0–12.0)
Neutro Abs: 4.8 10*3/uL (ref 1.4–7.7)
Neutrophils Relative %: 58.9 % (ref 43.0–77.0)
PLATELETS: 203 10*3/uL (ref 150.0–400.0)
RBC: 4.47 Mil/uL (ref 4.22–5.81)
RDW: 14.6 % (ref 11.5–15.5)
WBC: 8.1 10*3/uL (ref 4.0–10.5)

## 2015-03-30 LAB — LIPID PANEL
Cholesterol: 195 mg/dL (ref 0–200)
HDL: 36.6 mg/dL — ABNORMAL LOW (ref >39.00)
NonHDL: 158.09
Total CHOL/HDL Ratio: 5
Triglycerides: 211 mg/dL — ABNORMAL HIGH (ref 0.0–149.0)
VLDL: 42.2 mg/dL — ABNORMAL HIGH (ref 0.0–40.0)

## 2015-03-30 LAB — BASIC METABOLIC PANEL
BUN: 17 mg/dL (ref 6–23)
CO2: 28 meq/L (ref 19–32)
Calcium: 10.2 mg/dL (ref 8.4–10.5)
Chloride: 103 mEq/L (ref 96–112)
Creatinine, Ser: 0.88 mg/dL (ref 0.40–1.50)
GFR: 87.17 mL/min (ref >60.00)
Glucose, Bld: 95 mg/dL (ref 70–99)
POTASSIUM: 4 meq/L (ref 3.5–5.1)
SODIUM: 140 meq/L (ref 135–145)

## 2015-03-30 LAB — HEPATIC FUNCTION PANEL
ALT: 14 U/L (ref 0–53)
AST: 18 U/L (ref 0–37)
Albumin: 4.1 g/dL (ref 3.5–5.2)
Alkaline Phosphatase: 76 U/L (ref 39–117)
BILIRUBIN DIRECT: 0.1 mg/dL (ref 0.0–0.3)
BILIRUBIN TOTAL: 0.5 mg/dL (ref 0.2–1.2)
Total Protein: 7.1 g/dL (ref 6.0–8.3)

## 2015-03-30 LAB — TSH: TSH: 4.32 u[IU]/mL (ref 0.35–4.50)

## 2015-03-30 LAB — LDL CHOLESTEROL, DIRECT: Direct LDL: 120 mg/dL

## 2015-03-30 NOTE — Progress Notes (Signed)
Pre visit review using our clinic review tool, if applicable. No additional management support is needed unless otherwise documented below in the visit note. 

## 2015-03-30 NOTE — Telephone Encounter (Signed)
Pre visit call completed 

## 2015-03-30 NOTE — Assessment & Plan Note (Signed)
Chronic problem.  Tolerating statin w/o difficulty.  Check labs.  Adjust meds prn  

## 2015-03-30 NOTE — Telephone Encounter (Signed)
Pre Visit call completed. 

## 2015-03-30 NOTE — Assessment & Plan Note (Signed)
Chronic problem.  Adequate control.  Asymptomatic.  Check labs.  No anticipated med changes 

## 2015-03-30 NOTE — Assessment & Plan Note (Signed)
Pt's PE unchanged.  No need for colonoscopy at this time.  UTD w/ Dr Risa Grill.  Check labs.  Pneumovax given today.  Check labs.  Anticipatory guidance provided.

## 2015-03-30 NOTE — Patient Instructions (Signed)
Schedule a 6 month blood pressure follow up with Dr Link Snuffer notify you of your lab results and make any changes if needed There is no need for colonoscopy and you are up to date w/ Dr Risa Grill Call with any questions or concerns I'll miss you!!! Happy Holidays!!!

## 2015-03-30 NOTE — Assessment & Plan Note (Signed)
Chronic problem.  S/p ablation.  Currently asymptomatic- no recurrent sxs.  Denies palpitations, SOB.  On ASA and rate controlled w/ metoprolol.  Will continue to follow.

## 2015-03-30 NOTE — Progress Notes (Signed)
   Subjective:    Patient ID: Trevor Galloway, male    DOB: [birth year], 81 y.o.   MRN: [id number]  HPI Here today for CPE.  Risk Factors: HTN- chronic problem, on HCTZ and metoprolol w/ adequate control.  Denies CP, SOB, HAs, visual changes, edema. Hyperlipidemia- chronic problem, attempting to control w/ healthy diet.  Not currently on statin.  Denies abd pain, N/V, myalgias. Aflutter- s/p ablation.  Asymptomatic.  On Metoprolol and ASA.  Denies palpitations, shortness of breath Physical Activity: limited due to frequent falls and visual changes Fall Risk: high due to impaired balance Depression: chronic problem, not currently taking Zoloft. Hearing: decreased to conversational tones and whispered voice ADL's: independent Cognitive: normal linear thought process, memory and attention intact Home Safety: safe at home, lives w/ wife.  Daughter is local and supportive Height, Weight, BMI, Visual Acuity: see vitals, vision corrected to 20/20 w/ glasses Counseling: no need for colonoscopy, UTD on urology- Dr Risa Grill Care team reviewed and updated w/ pt Labs Ordered: See A&P Care Plan: See A&P    Review of Systems Patient reports no vision/hearing changes, anorexia, fever ,adenopathy, persistant/recurrent hoarseness, swallowing issues, chest pain, palpitations, edema, persistant/recurrent cough, hemoptysis, dyspnea (rest,exertional, paroxysmal nocturnal), gastrointestinal  bleeding (melena, rectal bleeding), abdominal pain, excessive heart burn, GU symptoms (dysuria, hematuria, voiding/incontinence issues) syncope, focal weakness, memory loss, numbness & tingling, skin/hair/nail changes, depression, anxiety, abnormal bruising/bleeding, musculoskeletal symptoms/signs.     Objective:   Physical Exam General Appearance:    Alert, cooperative, no distress, appears stated age  Head:    Normocephalic, without obvious abnormality, atraumatic  Eyes:    PERRL, conjunctiva/corneas clear, EOM's intact,  fundi    benign, both eyes       Ears:    Normal TM's and external ear canals, both ears  Nose:   Nares normal, septum midline, mucosa normal, no drainage   or sinus tenderness  Throat:   Lips, mucosa, and tongue normal; teeth and gums normal  Neck:   Supple, symmetrical, trachea midline, no adenopathy;       thyroid:  No enlargement/tenderness/nodules  Back:     Symmetric, no curvature, ROM normal, no CVA tenderness  Lungs:     Clear to auscultation bilaterally, respirations unlabored  Chest wall:    No tenderness or deformity  Heart:    Regular rate and rhythm, S1 and S2 normal, no murmur, rub   or gallop  Abdomen:     Soft, non-tender, bowel sounds active all four quadrants,    no masses, no organomegaly  Genitalia:    Deferred to Grapey  Rectal:    Extremities:   Extremities normal, atraumatic, no cyanosis or edema  Pulses:   2+ and symmetric all extremities  Skin:   Skin color, texture, turgor normal, no rashes or lesions  Lymph nodes:   Cervical, supraclavicular, and axillary nodes normal  Neurologic:   CNII-XII intact. Normal strength, sensation and reflexes      throughout          Assessment & Plan:

## 2015-04-10 ENCOUNTER — Other Ambulatory Visit: Payer: Self-pay | Admitting: Family Medicine

## 2015-04-13 NOTE — Telephone Encounter (Signed)
Medication filled to pharmacy as requested.   

## 2015-04-23 DIAGNOSIS — Z85828 Personal history of other malignant neoplasm of skin: Secondary | ICD-10-CM | POA: Diagnosis not present

## 2015-05-26 ENCOUNTER — Telehealth: Payer: Self-pay | Admitting: Family Medicine

## 2015-05-26 ENCOUNTER — Other Ambulatory Visit: Payer: Self-pay

## 2015-05-26 DIAGNOSIS — I10 Essential (primary) hypertension: Secondary | ICD-10-CM

## 2015-05-26 DIAGNOSIS — R000 Tachycardia, unspecified: Secondary | ICD-10-CM

## 2015-05-26 MED ORDER — METOPROLOL SUCCINATE ER 50 MG PO TB24: 50.0000 mg | ORAL_TABLET | Freq: Every day | ORAL | Status: DC

## 2015-05-26 NOTE — Telephone Encounter (Signed)
Caller name: Arbie Cookey  Relationship to patient: Daughter  Can be reached: [phone]  Pharmacy:  [id number], [place] [phone] (Phone) [phone] (Fax)        Reason for call: Request refill on metoprolol succinate (TOPROL-XL) 50 MG 24 hr tablet [id number]

## 2015-06-03 DIAGNOSIS — H35351 Cystoid macular degeneration, right eye: Secondary | ICD-10-CM | POA: Diagnosis not present

## 2015-06-03 DIAGNOSIS — D3131 Benign neoplasm of right choroid: Secondary | ICD-10-CM | POA: Diagnosis not present

## 2015-06-03 DIAGNOSIS — H353132 Nonexudative age-related macular degeneration, bilateral, intermediate dry stage: Secondary | ICD-10-CM | POA: Diagnosis not present

## 2015-06-03 DIAGNOSIS — H35372 Puckering of macula, left eye: Secondary | ICD-10-CM | POA: Diagnosis not present

## 2015-06-05 DIAGNOSIS — H35351 Cystoid macular degeneration, right eye: Secondary | ICD-10-CM | POA: Diagnosis not present

## 2015-06-29 DIAGNOSIS — H35372 Puckering of macula, left eye: Secondary | ICD-10-CM | POA: Diagnosis not present

## 2015-06-29 DIAGNOSIS — H353132 Nonexudative age-related macular degeneration, bilateral, intermediate dry stage: Secondary | ICD-10-CM | POA: Diagnosis not present

## 2015-06-29 DIAGNOSIS — H43812 Vitreous degeneration, left eye: Secondary | ICD-10-CM | POA: Diagnosis not present

## 2015-06-29 DIAGNOSIS — H35351 Cystoid macular degeneration, right eye: Secondary | ICD-10-CM | POA: Diagnosis not present

## 2015-06-30 ENCOUNTER — Telehealth: Payer: Self-pay | Admitting: Family Medicine

## 2015-06-30 NOTE — Telephone Encounter (Signed)
Patients daughter dropped off form for handicapped sticker  Put form in [place]

## 2015-06-30 NOTE — Telephone Encounter (Signed)
Handicap placard received, completed as much as possible; forwarded to provider/SLS 02/14

## 2015-07-01 NOTE — Telephone Encounter (Signed)
Patient called to inform handicap placard is good until 2020 therefore please disregard message below

## 2015-07-01 NOTE — Telephone Encounter (Signed)
Noted  

## 2015-07-13 DIAGNOSIS — Z0000 Encounter for general adult medical examination without abnormal findings: Secondary | ICD-10-CM | POA: Diagnosis not present

## 2015-07-13 DIAGNOSIS — C61 Malignant neoplasm of prostate: Secondary | ICD-10-CM | POA: Diagnosis not present

## 2015-07-23 DIAGNOSIS — Z85828 Personal history of other malignant neoplasm of skin: Secondary | ICD-10-CM | POA: Diagnosis not present

## 2015-07-23 DIAGNOSIS — C44519 Basal cell carcinoma of skin of other part of trunk: Secondary | ICD-10-CM | POA: Diagnosis not present

## 2015-07-23 DIAGNOSIS — L821 Other seborrheic keratosis: Secondary | ICD-10-CM | POA: Diagnosis not present

## 2015-09-28 ENCOUNTER — Encounter: Payer: Self-pay | Admitting: Family Medicine

## 2015-09-28 ENCOUNTER — Ambulatory Visit (INDEPENDENT_AMBULATORY_CARE_PROVIDER_SITE_OTHER): Payer: Medicare Other | Admitting: Family Medicine

## 2015-09-28 VITALS — BP 122/78 | HR 83 | Temp 98.2°F | Ht 69.0 in | Wt 202.1 lb

## 2015-09-28 DIAGNOSIS — Z0000 Encounter for general adult medical examination without abnormal findings: Secondary | ICD-10-CM | POA: Diagnosis not present

## 2015-09-28 DIAGNOSIS — R000 Tachycardia, unspecified: Secondary | ICD-10-CM

## 2015-09-28 DIAGNOSIS — E785 Hyperlipidemia, unspecified: Secondary | ICD-10-CM

## 2015-09-28 DIAGNOSIS — K921 Melena: Secondary | ICD-10-CM

## 2015-09-28 DIAGNOSIS — I10 Essential (primary) hypertension: Secondary | ICD-10-CM | POA: Diagnosis not present

## 2015-09-28 DIAGNOSIS — Z8546 Personal history of malignant neoplasm of prostate: Secondary | ICD-10-CM

## 2015-09-28 LAB — COMPREHENSIVE METABOLIC PANEL
ALK PHOS: 72 U/L (ref 39–117)
ALT: 15 U/L (ref 0–53)
AST: 19 U/L (ref 0–37)
Albumin: 4.1 g/dL (ref 3.5–5.2)
BILIRUBIN TOTAL: 0.6 mg/dL (ref 0.2–1.2)
BUN: 17 mg/dL (ref 6–23)
CO2: 27 mEq/L (ref 19–32)
CREATININE: 0.84 mg/dL (ref 0.40–1.50)
Calcium: 9.8 mg/dL (ref 8.4–10.5)
Chloride: 105 mEq/L (ref 96–112)
GFR: 91.87 mL/min (ref >60.00)
GLUCOSE: 92 mg/dL (ref 70–99)
Potassium: 4.2 mEq/L (ref 3.5–5.1)
Sodium: 139 mEq/L (ref 135–145)
TOTAL PROTEIN: 6.8 g/dL (ref 6.0–8.3)

## 2015-09-28 LAB — CBC WITH DIFFERENTIAL/PLATELET
BASOS ABS: 0.1 10*3/uL (ref 0.0–0.1)
Basophils Relative: 1 % (ref 0.0–3.0)
EOS PCT: 0.9 % (ref 0.0–5.0)
Eosinophils Absolute: 0.1 10*3/uL (ref 0.0–0.7)
HEMATOCRIT: 40.8 % (ref 39.0–52.0)
HEMOGLOBIN: 13.9 g/dL (ref 13.0–17.0)
LYMPHS ABS: 2.3 10*3/uL (ref 0.7–4.0)
LYMPHS PCT: 28.3 % (ref 12.0–46.0)
MCHC: 34.2 g/dL (ref 30.0–36.0)
MCV: 95.1 fl (ref 78.0–100.0)
MONOS PCT: 11.5 % (ref 3.0–12.0)
Monocytes Absolute: 1 10*3/uL (ref 0.1–1.0)
Neutro Abs: 4.8 10*3/uL (ref 1.4–7.7)
Neutrophils Relative %: 58.3 % (ref 43.0–77.0)
Platelets: 193 10*3/uL (ref 150.0–400.0)
RBC: 4.29 Mil/uL (ref 4.22–5.81)
RDW: 14.5 % (ref 11.5–15.5)
WBC: 8.3 10*3/uL (ref 4.0–10.5)

## 2015-09-28 LAB — TSH: TSH: 3.16 u[IU]/mL (ref 0.35–4.50)

## 2015-09-28 LAB — LIPID PANEL
Cholesterol: 195 mg/dL (ref 0–200)
HDL: 31.7 mg/dL — ABNORMAL LOW (ref >39.00)
NONHDL: 163.01
TRIGLYCERIDES: 238 mg/dL — AB (ref 0.0–149.0)
Total CHOL/HDL Ratio: 6
VLDL: 47.6 mg/dL — ABNORMAL HIGH (ref 0.0–40.0)

## 2015-09-28 LAB — LDL CHOLESTEROL, DIRECT: LDL DIRECT: 115 mg/dL

## 2015-09-28 MED ORDER — METOPROLOL SUCCINATE ER 50 MG PO TB24: 50.0000 mg | ORAL_TABLET | Freq: Every day | ORAL | Status: DC

## 2015-09-28 MED ORDER — HYDROCHLOROTHIAZIDE 12.5 MG PO TABS: 12.5000 mg | ORAL_TABLET | Freq: Every day | ORAL | Status: DC

## 2015-09-28 MED ORDER — HYDROCORTISONE ACETATE 25 MG RE SUPP: 25.0000 mg | Freq: Two times a day (BID) | RECTAL | Status: DC

## 2015-09-28 NOTE — Progress Notes (Signed)
Pre visit review using our clinic review tool, if applicable. No additional management support is needed unless otherwise documented below in the visit note. 

## 2015-09-28 NOTE — Assessment & Plan Note (Addendum)
Had one episode yesterday none today will allow for anusol hc suppositories qhs for 3 days if recurs then will need referral for evaluation

## 2015-09-28 NOTE — Progress Notes (Signed)
Subjective:    Patient ID: Trevor Galloway, male    DOB: 1927/01/18, 82 y.o.   MRN: [id number]  Chief Complaint  Patient presents with  . Establish Care    HPI Patient is in today to establish care as new patient.  Patient reports finding some bright red blood during a bowel movement but there was not any stool just blood. Occurred only once yesterday and has not recurred. No fevers, chills, nausea, vomiting, anorexia, diarrhea. No other recent illness or acute concerns. Denies CP/palp/SOB/HA/congestion/fevers or GU c/o. Taking meds as prescribed  Past Medical History  Diagnosis Date  . Cancer [facility])     Prostate cancer  . Family hx colonic polyps   . RBBB (right bundle branch block)   . Atrial flutter ([place])     s/p CTI ablation 8/11  . Hiatal hernia   . Hyperlipidemia   . Hypertension   . H/O prostate cancer 10/04/2015    Past Surgical History  Procedure Laterality Date  . Prostate surgery      seed implant  . Eye surgery      bilateral cataracts  . Hernia repair      Family History  Problem Relation Age of Onset  . Parkinsonism Sister   . Parkinsonism Brother   . Cancer Brother     prostate cancer  . COPD Father     Social History   Social History  . Marital Status: Single    Spouse Name: N/A  . Number of Children: N/A  . Years of Education: N/A   Occupational History  . retired     trucking business   Social History Main Topics  . Smoking status: Former Smoker    Quit date: 09/23/1965  . Smokeless tobacco: Not on file  . Alcohol Use: No     Comment: none now  . Drug Use: No  . Sexual Activity: Not on file   Other Topics Concern  . Not on file   Social History Narrative    Outpatient Prescriptions Prior to Visit  Medication Sig Dispense Refill  . aspirin 81 MG tablet Take 81 mg by mouth daily.    . Multiple Vitamins-Minerals (PRESERVISION AREDS 2) CAPS Take 1 capsule by mouth 2 (two) times daily.    Marland Kitchen PREVIDENT 5000 BOOSTER PLUS 1.1 % PSTE    0  . hydrochlorothiazide (HYDRODIURIL) 12.5 MG tablet take 1 tablet by mouth once daily 90 tablet 1  . metoprolol succinate (TOPROL-XL) 50 MG 24 hr tablet Take 1 tablet (50 mg total) by mouth daily. Take with or immediately following a meal. 90 tablet 1   No facility-administered medications prior to visit.    No Known Allergies  Review of Systems  Constitutional: Negative for fever and malaise/fatigue.  HENT: Negative for congestion.   Eyes: Negative for blurred vision.  Respiratory: Negative for shortness of breath.   Cardiovascular: Negative for chest pain, palpitations and leg swelling.  Gastrointestinal: Positive for blood in stool. Negative for nausea and abdominal pain.  Genitourinary: Negative for dysuria and frequency.  Musculoskeletal: Negative for falls.  Skin: Negative for rash.  Neurological: Negative for dizziness, loss of consciousness and headaches.  Endo/Heme/Allergies: Negative for environmental allergies.  Psychiatric/Behavioral: Negative for depression. The patient is not nervous/anxious.        Objective:    Physical Exam  Constitutional: He is oriented to person, place, and time. He appears well-developed and well-nourished. No distress.  HENT:  Head: Normocephalic and atraumatic.  Eyes:  Conjunctivae are normal.  Neck: Neck supple. No thyromegaly present.  Cardiovascular: Normal rate, regular rhythm and normal heart sounds.   No murmur heard. Pulmonary/Chest: Effort normal and breath sounds normal. No respiratory distress. He has no wheezes.  Abdominal: Soft. Bowel sounds are normal. He exhibits no mass. There is no tenderness.  Musculoskeletal: He exhibits no edema.  Lymphadenopathy:    He has no cervical adenopathy.  Neurological: He is alert and oriented to person, place, and time.  Skin: Skin is warm and dry.  Psychiatric: He has a normal mood and affect. His behavior is normal.    BP 122/78 mmHg  Pulse 83  Temp(Src) 98.2 F (36.8 C) (Oral)   Ht 5' 9"  (1.753 m)  Wt 202 lb 2 oz (91.683 kg)  BMI 29.83 kg/m2  SpO2 96% Wt Readings from Last 3 Encounters:  09/28/15 202 lb 2 oz (91.683 kg)  03/30/15 198 lb 6 oz (89.982 kg)  09/25/14 198 lb 2 oz (89.869 kg)     Lab Results  Component Value Date   WBC 8.3 09/28/2015   HGB 13.9 09/28/2015   HCT 40.8 09/28/2015   PLT 193.0 09/28/2015   GLUCOSE 92 09/28/2015   CHOL 195 09/28/2015   TRIG 238.0* 09/28/2015   HDL 31.70* 09/28/2015   LDLDIRECT 115.0 09/28/2015   LDLCALC 116* 09/25/2014   ALT 15 09/28/2015   AST 19 09/28/2015   NA 139 09/28/2015   K 4.2 09/28/2015   CL 105 09/28/2015   CREATININE 0.84 09/28/2015   BUN 17 09/28/2015   CO2 27 09/28/2015   TSH 3.16 09/28/2015   INR 1.74* 01/05/2010   HGBA1C 5.9* 10/29/2013    Lab Results  Component Value Date   TSH 3.16 09/28/2015   Lab Results  Component Value Date   WBC 8.3 09/28/2015   HGB 13.9 09/28/2015   HCT 40.8 09/28/2015   MCV 95.1 09/28/2015   PLT 193.0 09/28/2015   Lab Results  Component Value Date   NA 139 09/28/2015   K 4.2 09/28/2015   CO2 27 09/28/2015   GLUCOSE 92 09/28/2015   BUN 17 09/28/2015   CREATININE 0.84 09/28/2015   BILITOT 0.6 09/28/2015   ALKPHOS 72 09/28/2015   AST 19 09/28/2015   ALT 15 09/28/2015   PROT 6.8 09/28/2015   ALBUMIN 4.1 09/28/2015   CALCIUM 9.8 09/28/2015   GFR 91.87 09/28/2015   Lab Results  Component Value Date   CHOL 195 09/28/2015   Lab Results  Component Value Date   HDL 31.70* 09/28/2015   Lab Results  Component Value Date   LDLCALC 116* 09/25/2014   Lab Results  Component Value Date   TRIG 238.0* 09/28/2015   Lab Results  Component Value Date   CHOLHDL 6 09/28/2015   Lab Results  Component Value Date   HGBA1C 5.9* 10/29/2013       Assessment & Plan:   Problem List Items Addressed This Visit    TACHYCARDIA    RRR today      Hyperlipidemia    Encouraged heart healthy diet, increase exercise, avoid trans fats, consider a krill oil  cap daily      Relevant Medications   metoprolol succinate (TOPROL-XL) 50 MG 24 hr tablet   hydrochlorothiazide (HYDRODIURIL) 12.5 MG tablet   Other Relevant Orders   Comp Met (CMET) (Completed)   TSH (Completed)   Lipid panel (Completed)   CBC w/Diff (Completed)   HTN (hypertension)    Well controlled, no changes to  meds. Encouraged heart healthy diet such as the DASH diet and exercise as tolerated.       Relevant Medications   metoprolol succinate (TOPROL-XL) 50 MG 24 hr tablet   hydrochlorothiazide (HYDRODIURIL) 12.5 MG tablet   Other Relevant Orders   Comp Met (CMET) (Completed)   TSH (Completed)   Lipid panel (Completed)   CBC w/Diff (Completed)   HEMATOCHEZIA    Had one episode yesterday none today will allow for       Relevant Orders   Comp Met (CMET) (Completed)   TSH (Completed)   Lipid panel (Completed)   CBC w/Diff (Completed)   H/O prostate cancer    Underwent radioactive seed implants roughly 17 years ago with Dr Risa Grill doing well. He is considering transfering care to the new [facility] location once it opens since they live near there.        Other Visit Diagnoses    Tachycardia    -  Primary    Relevant Medications    metoprolol succinate (TOPROL-XL) 50 MG 24 hr tablet    Other Relevant Orders    Comp Met (CMET) (Completed)    TSH (Completed)    Lipid panel (Completed)    CBC w/Diff (Completed)    Preventative health care        Relevant Orders    Comp Met (CMET) (Completed)    TSH (Completed)    Lipid panel (Completed)    CBC w/Diff (Completed)       I have changed Mr. Eisenhour's hydrochlorothiazide. I am also having him start on hydrocortisone. Additionally, I am having him maintain his aspirin, PREVIDENT 5000 BOOSTER PLUS, PRESERVISION AREDS 2, and metoprolol succinate.  Meds ordered this encounter  Medications  . metoprolol succinate (TOPROL-XL) 50 MG 24 hr tablet    Sig: Take 1 tablet (50 mg total) by mouth daily. Take with or  immediately following a meal.    Dispense:  90 tablet    Refill:  1  . hydrochlorothiazide (HYDRODIURIL) 12.5 MG tablet    Sig: Take 1 tablet (12.5 mg total) by mouth daily.    Dispense:  90 tablet    Refill:  1  . hydrocortisone (ANUSOL-HC) 25 MG suppository    Sig: Place 1 suppository (25 mg total) rectally 2 (two) times daily.    Dispense:  12 suppository    Refill:  1     BLYTH, STACEY, MD

## 2015-09-28 NOTE — Patient Instructions (Signed)

## 2015-09-29 DIAGNOSIS — H3581 Retinal edema: Secondary | ICD-10-CM | POA: Diagnosis not present

## 2015-09-29 DIAGNOSIS — H353133 Nonexudative age-related macular degeneration, bilateral, advanced atrophic without subfoveal involvement: Secondary | ICD-10-CM | POA: Diagnosis not present

## 2015-09-29 DIAGNOSIS — D3131 Benign neoplasm of right choroid: Secondary | ICD-10-CM | POA: Diagnosis not present

## 2015-09-29 DIAGNOSIS — H35372 Puckering of macula, left eye: Secondary | ICD-10-CM | POA: Diagnosis not present

## 2015-10-04 ENCOUNTER — Encounter: Payer: Self-pay | Admitting: Family Medicine

## 2015-10-04 DIAGNOSIS — Z8546 Personal history of malignant neoplasm of prostate: Secondary | ICD-10-CM

## 2015-10-04 HISTORY — DX: Personal history of malignant neoplasm of prostate: Z85.46

## 2015-10-04 NOTE — Assessment & Plan Note (Signed)
Well controlled, no changes to meds. Encouraged heart healthy diet such as the DASH diet and exercise as tolerated.  °

## 2015-10-04 NOTE — Assessment & Plan Note (Signed)
Encouraged heart healthy diet, increase exercise, avoid trans fats, consider a krill oil cap daily 

## 2015-10-04 NOTE — Assessment & Plan Note (Signed)
RRR today 

## 2015-10-04 NOTE — Assessment & Plan Note (Signed)
Underwent radioactive seed implants roughly 17 years ago with Dr Risa Grill doing well. He is considering transfering care to the new [facility] location once it opens since they live near there.

## 2016-01-01 ENCOUNTER — Encounter: Payer: Self-pay | Admitting: Family Medicine

## 2016-01-01 ENCOUNTER — Ambulatory Visit (INDEPENDENT_AMBULATORY_CARE_PROVIDER_SITE_OTHER): Payer: Medicare Other | Admitting: Family Medicine

## 2016-01-01 VITALS — BP 132/82 | HR 71 | Ht 70.0 in | Wt 197.5 lb

## 2016-01-01 DIAGNOSIS — I517 Cardiomegaly: Secondary | ICD-10-CM

## 2016-01-01 DIAGNOSIS — C801 Malignant (primary) neoplasm, unspecified: Secondary | ICD-10-CM

## 2016-01-01 DIAGNOSIS — I8311 Varicose veins of right lower extremity with inflammation: Secondary | ICD-10-CM

## 2016-01-01 DIAGNOSIS — Z8546 Personal history of malignant neoplasm of prostate: Secondary | ICD-10-CM

## 2016-01-01 DIAGNOSIS — I872 Venous insufficiency (chronic) (peripheral): Secondary | ICD-10-CM | POA: Insufficient documentation

## 2016-01-01 DIAGNOSIS — R609 Edema, unspecified: Secondary | ICD-10-CM

## 2016-01-01 DIAGNOSIS — E860 Dehydration: Secondary | ICD-10-CM

## 2016-01-01 DIAGNOSIS — I10 Essential (primary) hypertension: Secondary | ICD-10-CM

## 2016-01-01 DIAGNOSIS — I519 Heart disease, unspecified: Secondary | ICD-10-CM

## 2016-01-01 DIAGNOSIS — I8312 Varicose veins of left lower extremity with inflammation: Secondary | ICD-10-CM

## 2016-01-01 DIAGNOSIS — I5189 Other ill-defined heart diseases: Secondary | ICD-10-CM

## 2016-01-01 DIAGNOSIS — H542 Low vision, both eyes: Secondary | ICD-10-CM

## 2016-01-01 DIAGNOSIS — E782 Mixed hyperlipidemia: Secondary | ICD-10-CM

## 2016-01-01 DIAGNOSIS — F329 Major depressive disorder, single episode, unspecified: Secondary | ICD-10-CM

## 2016-01-01 DIAGNOSIS — F32A Depression, unspecified: Secondary | ICD-10-CM

## 2016-01-01 DIAGNOSIS — R2689 Other abnormalities of gait and mobility: Secondary | ICD-10-CM

## 2016-01-01 DIAGNOSIS — H547 Unspecified visual loss: Secondary | ICD-10-CM

## 2016-01-01 DIAGNOSIS — R7302 Impaired glucose tolerance (oral): Secondary | ICD-10-CM

## 2016-01-01 DIAGNOSIS — R000 Tachycardia, unspecified: Secondary | ICD-10-CM

## 2016-01-01 DIAGNOSIS — I484 Atypical atrial flutter: Secondary | ICD-10-CM

## 2016-01-01 NOTE — Assessment & Plan Note (Addendum)
Mild to moderate bilateral lower extremities.  Pathophysiology discussed with patient and wife.  He barely drinks any water during the day, less than one bottle total.   Try to drink at least 60 ounces of water a day.  Try to walk 2 times daily or 10 minutes at a time using cane.  And also get up out of your chair at least 5 minutes every hour- as he admits he spends most of the day in it.

## 2016-01-01 NOTE — Assessment & Plan Note (Addendum)
According to patient's chart-appears chronic in nature. No PND, orthopnea, complaints of irregular heartbeat, chest pain, increasing shortness of breath etc.  But pt with known Grade I diastolic congestive heart failure  IN July , 2014: Transthoracic echocardiography. Left ventricle: The cavity size was normal. Wall thickness was increased in a pattern of moderate LVH. There was mild focal basal hypertrophy of the septum. Systolic function was normal. The estimated ejection fraction was in the range of 60% to 65%. Wall motion was normal; there were no regional wall motion abnormalities. Doppler parameters are consistent with abnormal left ventricular relaxation (grade 1 diastolic dysfunction).         Discussed possible symptoms of heart failure with patient and wife.  They will keep vigilant.    If he develops any concerning symptoms or increasing bilateral lower extremity edema, we will obtain TTE.   Of course patient is a candidate for med management only and this testing may aid [place] in altering his medications to enhance his quality of life in future.  -walk more  -Increase water intake  --less salt intake  -offered TED hose which they decline, but also suggested they could purchase intermittent pneumatic compression devices for his bilateral lower extremities on Amazon or at a medical supply store

## 2016-01-01 NOTE — Assessment & Plan Note (Signed)
Patient was seen by neurologist to rule out Parkinson's because it is very prominent in his family

## 2016-01-01 NOTE — Progress Notes (Signed)
New patient office visit note:  Impression and Recommendations:    1. Essential hypertension   2. Mixed hyperlipidemia   3. Chronic venous stasis dermatitis of both lower extremities   4. Bilateral lower extremity edema   5. Glucose intolerance (impaired glucose tolerance)   6. Echocardiogram shows Grade I left ventricular diastolic dysfunction   7. LVH (left ventricular hypertrophy)   8. TACHYCARDIA   9. Vision problems   10. H/O prostate cancer   11. h/o Shuffling gait due to OA knees/body   12. Depression   13. Dehydration   14. Atypical atrial flutter ([facility])   15. History of prostate cancer     Depression Patient and wife state that "he is not depressed and never was".  He apparently did not tolerate medicine in the past- an ssri-  that Dr Wonda Horner started.    B/L Peripheral Edema According to patient's chart-appears chronic in nature. No PND, orthopnea, complaints of irregular heartbeat, chest pain, increasing shortness of breath etc.  But pt with known Grade I diastolic congestive heart failure  IN July , 2014: Transthoracic echocardiography. Left ventricle: The cavity size was normal. Wall thickness was increased in a pattern of moderate LVH. There was mild focal basal hypertrophy of the septum. Systolic function was normal. The estimated ejection fraction was in the range of 60% to 65%. Wall motion was normal; there were no regional wall motion abnormalities. Doppler parameters are consistent with abnormal left ventricular relaxation (grade 1 diastolic dysfunction).         Discussed possible symptoms of heart failure with patient and wife.  They will keep vigilant.    If he develops any concerning symptoms or increasing bilateral lower extremity edema, we will obtain TTE.   Of course patient is a candidate for med management only and this testing may aid [place] in altering his medications to enhance his quality of life in future.  -walk more  -Increase  water intake  --less salt intake  -offered TED hose which they decline, but also suggested they could purchase intermittent pneumatic compression devices for his bilateral lower extremities on Amazon or at a medical supply store  H/O prostate cancer Still sees Dr Risa Grill- going end of Aug.   Chronically elevated PSA.  Per patient and wife, status post radiation pellets.  Vision problems Sees Dr B[facility]-  [place] on [address] [place]  TACHYCARDIA Pulse in 70's today.    On metoprolol without symptoms today.   I discussed with wife/ pt this medication may be contributing to his long-standing malaise and fatigue which is well  documented in his chart. (It was thought in the past this was due to depression and patient failed SSRI treatment)  h/o Shuffling gait due to OA knees/body Patient was seen by neurologist to rule out Parkinson's because it is very prominent in his family  Mixed hyperlipidemia Controlled through Diet and Exercise  Chronic venous stasis dermatitis of both lower extremities Mild to moderate bilateral lower extremities.  Pathophysiology discussed with patient and wife.  He barely drinks any water during the day, less than one bottle total.   Try to drink at least 60 ounces of water a day.  Try to walk 2 times daily or 10 minutes at a time using cane.  And also get up out of your chair at least 5 minutes every hour- as he admits he spends most of the day in it.    Dehydration We'll obtain BMP  but had history in the past of this and with his very little water intake, suspect he may be at high risk for orthostatic hypotension and possible falls which I discussed with them both today.  Advised dietary and lifestyle modifications.  Glucose intolerance (impaired glucose tolerance) Appears to been 5.9 in June 2015- which is pretty "N" for someone his age.   However, this has not been checked since,  it appears,  when reviewing his chart.    We will obtain A1c next OV/  round of blood work.   Has chronic polyuria/ leaking of urine.   Atrial flutter He had cardiac ablation 12/30/09 Dr Rayann Heman.  Appears he has not seen cardiology since then.   Pulse in 70's today.    On metoprolol without symptoms today.        HTN (hypertension) Dietary and lifestyle modifications discussed. Continue meds- well controlled blood pressure.  LVH (left ventricular hypertrophy) Seen on echocardiogram in July 2014.    Echocardiogram shows Grade I left ventricular diastolic dysfunction Appreciated on TTE in 7\14    No orders of the defined types were placed in this encounter.   Patient's Medications  New Prescriptions   No medications on file  Previous Medications   ASPIRIN 81 MG TABLET    Take 81 mg by mouth daily.   HYDROCHLOROTHIAZIDE (HYDRODIURIL) 12.5 MG TABLET    Take 1 tablet (12.5 mg total) by mouth daily.   METOPROLOL SUCCINATE (TOPROL-XL) 50 MG 24 HR TABLET    Take 1 tablet (50 mg total) by mouth daily. Take with or immediately following a meal.   MULTIPLE VITAMINS-MINERALS (PRESERVISION AREDS 2) CAPS    Take 1 capsule by mouth 2 (two) times daily.   PREVIDENT 5000 BOOSTER PLUS 1.1 % PSTE      Modified Medications   No medications on file  Discontinued Medications   HYDROCORTISONE (ANUSOL-HC) 25 MG SUPPOSITORY    Place 1 suppository (25 mg total) rectally 2 (two) times daily.    Return in about 3 months (around 04/02/2016) for come fasting--> htn, chol, moving more.  The patient was counseled, risk factors were discussed, anticipatory guidance given.  Gross side effects, risk and benefits, and alternatives of medications discussed with patient.  Patient is aware that all medications have potential side effects and we are unable to predict every side effect or drug-drug interaction that may occur.  Expresses verbal understanding and consents to current therapy plan and treatment regimen.  Please see AVS handed out to patient at the end of our visit  for further patient instructions/ counseling done pertaining to today's office visit.    Note: This document was prepared using Dragon voice recognition software and may include unintentional dictation errors.  ----------------------------------------------------------------------------------------------------------------------    Subjective:    Chief Complaint  Patient presents with  . Establish Care    HPI: Trevor Galloway is a pleasant 81 y.o. male who presents to [place] at [facility] today to review his medical history with me and establish care.   Married 77 yrs- [name] is wife accompanies him today.  He has his bachelor's degree and is been retired for several years now.  They live near their son Konrad Dolores and [place] who works at [employer]- Is a Marine scientist.     Other specialists that patient sees-   Dr Gareth Eagle and Jolayne Haines- old PCPs.   Dr Berenice Primas- ortho. 4 yrs ago- fell and injured R shoulder  Dr Rayann Heman in  2011 - ablation to heart for tachycardia.   Neuro-  Dr Tat- - w/up for Parkinson's- sign in family; genetic testing was negative.        Wt Readings from Last 3 Encounters:  01/01/16 197 lb 8 oz (89.6 kg)  09/28/15 202 lb 2 oz (91.7 kg)  03/30/15 198 lb 6 oz (90 kg)   BP Readings from Last 3 Encounters:  01/01/16 132/82  09/28/15 122/78  03/30/15 134/80   Pulse Readings from Last 3 Encounters:  01/01/16 71  09/28/15 83  03/30/15 72     Patient Active Problem List   Diagnosis Date Noted  . Vision problems 01/01/2016  . Chronic venous stasis dermatitis of both lower extremities 01/01/2016  . Glucose intolerance (impaired glucose tolerance) 01/01/2016  . LVH (left ventricular hypertrophy) 01/01/2016  . Echocardiogram shows Grade I left ventricular diastolic dysfunction 99991111  . H/O prostate cancer 10/04/2015  . h/o Shuffling gait due to OA knees/body 09/16/2013  . Frequent falls 06/17/2013  . Other malaise and fatigue  04/09/2013  . HTN (hypertension) 11/28/2012  . Depression 11/28/2012  . B/L Peripheral Edema 11/13/2012  . Allergic rhinitis 08/15/2012  . Flu-like symptoms 05/18/2012  . Dehydration 05/18/2012  . General medical examination 01/03/2011  . Mixed hyperlipidemia 07/05/2010  . HEMATOCHEZIA 07/05/2010  . RIGHT BUNDLE BRANCH BLOCK 12/25/2009  . TACHYCARDIA 12/25/2009  . Atrial flutter ([place]) 12/24/2009     Past Medical History:  Diagnosis Date  . Atrial flutter ([place])    s/p CTI ablation 8/11  . Cancer [facility])    Prostate cancer  . Family hx colonic polyps   . H/O prostate cancer 10/04/2015  . Hiatal hernia   . Hyperlipidemia   . Hypertension   . RBBB (right bundle branch block)      Past Surgical History:  Procedure Laterality Date  . EYE SURGERY     bilateral cataracts  . HERNIA REPAIR    . PROSTATE SURGERY     seed implant     Family History  Problem Relation Age of Onset  . COPD Father   . Parkinsonism Sister   . Parkinsonism Brother   . Cancer Brother     prostate cancer     History  Drug Use No    History  Alcohol Use No    Comment: none now    History  Smoking Status  . Former Smoker  . Quit date: 09/23/1965  Smokeless Tobacco  . Never Used    Patient's Medications  New Prescriptions   No medications on file  Previous Medications   ASPIRIN 81 MG TABLET    Take 81 mg by mouth daily.   HYDROCHLOROTHIAZIDE (HYDRODIURIL) 12.5 MG TABLET    Take 1 tablet (12.5 mg total) by mouth daily.   METOPROLOL SUCCINATE (TOPROL-XL) 50 MG 24 HR TABLET    Take 1 tablet (50 mg total) by mouth daily. Take with or immediately following a meal.   MULTIPLE VITAMINS-MINERALS (PRESERVISION AREDS 2) CAPS    Take 1 capsule by mouth 2 (two) times daily.   PREVIDENT 5000 BOOSTER PLUS 1.1 % PSTE      Modified Medications   No medications on file  Discontinued Medications   HYDROCORTISONE (ANUSOL-HC) 25 MG SUPPOSITORY    Place 1 suppository (25 mg total) rectally 2 (two)  times daily.    Allergies: Review of patient's allergies indicates no known allergies.  Review of Systems:   ( Completed via Adult Medical History Intake form today )  General:  Denies fever, chills, appetite changes, unexplained weight loss.  Optho/Auditory:   Denies visual changes, blurred vision/LOV, ringing in ears/ diff hearing Respiratory:   Denies SOB, DOE, cough, wheezing.  Cardiovascular:   Denies chest pain, palpitations, new onset peripheral edema  Gastrointestinal:   Denies nausea, vomiting, diarrhea.  Genitourinary:    Denies dysuria, increased frequency, flank pain.  Endocrine:     Denies hot or cold intolerance, polyuria, polydipsia. Musculoskeletal:  Denies unexplained myalgias, joint swelling, arthralgias, gait problems.  Skin:  Denies rash, suspicious lesions or new/ changes in moles Neurological:    Denies dizziness, syncope, unexplained weakness, lightheadedness, numbness  Psychiatric/Behavioral:   Denies mood changes, suicidal or homicidal ideations, hallucinations    Objective:   Blood pressure 132/82, pulse 71, height 5\' 10"  (1.778 m), weight 197 lb 8 oz (89.6 kg). Body mass index is 28.34 kg/m.   General: Well Developed, well nourished, and in no acute distress.  Neuro: Alert and oriented x3, extra-ocular muscles intact, sensation grossly intact.  HEENT: Normocephalic, atraumatic, pupils equal round reactive to light, neck supple, no gross masses, no carotid bruits, no JVD apprec Skin: no gross suspicious lesions or rashes  Cardiac: Regular rate and rhythm, no murmurs rubs or gallops.  Respiratory: Essentially clear to auscultation bilaterally. Not using accessory muscles, speaking in full sentences.  Abdominal: Soft, not grossly distended Musculoskeletal: Ambulates w/o diff, FROM * 4 ext.  Vasc: less 2 sec cap RF, warm and pink, chronic venous stasis dermatitis  b/l LExt with mild pitting edema and hyperpigmentation of the distal lower extremities Psych:  No  HI/SI, judgement and insight good.

## 2016-01-01 NOTE — Assessment & Plan Note (Addendum)
Appears to been 5.9 in June 2015- which is pretty "N" for someone his age.   However, this has not been checked since,  it appears,  when reviewing his chart.    We will obtain A1c next OV/ round of blood work.   Has chronic polyuria/ leaking of urine.

## 2016-01-01 NOTE — Assessment & Plan Note (Signed)
Dietary and lifestyle modifications discussed. Continue meds- well controlled blood pressure.

## 2016-01-01 NOTE — Assessment & Plan Note (Addendum)
Patient and wife state that "he is not depressed and never was".  He apparently did not tolerate medicine in the past- an ssri-  that Dr Wonda Horner started.

## 2016-01-01 NOTE — Patient Instructions (Signed)
Try to drink at least 60 ounces of water a day.  Try to walk 2 times daily or 10 minutes at a time using her cane. And also get up out of your chair at least 5 minutes every hour     Guidelines for a Low Cholesterol, Low Saturated Fat Diet   Fats - Limit total intake of fats and oils. - Avoid butter, stick margarine, shortening, lard, palm and coconut oils. - Limit mayonnaise, salad dressings, gravies and sauces, unless they are homemade with low-fat ingredients. - Limit chocolate. - Choose low-fat and nonfat products, such as low-fat mayonnaise, low-fat or non-hydrogenated peanut butter, low-fat or fat-free salad dressings and nonfat gravy. - Use vegetable oil, such as canola or olive oil. - Look for margarine that does not contain trans fatty acids. - Use nuts in moderate amounts. - Read ingredient labels carefully to determine both amount and type of fat present in foods. Limit saturated and trans fats! - Avoid high-fat processed and convenience foods.  Meats and Meat Alternatives - Choose fish, chicken, [place] and lean meats. - Use dried beans, peas, lentils and tofu. - Limit egg yolks to three to four per week. - If you eat red meat, limit to no more than three servings per week and choose loin or round cuts. - Avoid fatty meats, such as bacon, sausage, franks, luncheon meats and ribs. - Avoid all organ meats, including liver.  Dairy - Choose nonfat or low-fat milk, yogurt and cottage cheese. - Most cheeses are high in fat. Choose cheeses made from non-fat milk, such as mozzarella and ricotta cheese. - Choose light or fat-free cream cheese and sour cream. - Avoid cream and sauces made with cream.  Fruits and Vegetables - Eat a wide variety of fruits and vegetables. - Use lemon juice, vinegar or "mist" olive oil on vegetables. - Avoid adding sauces, fat or oil to vegetables.  Breads, Cereals and Grains - Choose whole-grain breads, cereals, pastas and rice. - Avoid  high-fat snack foods, such as granola, cookies, pies, pastries, doughnuts and croissants.  Cooking Tips - Avoid deep fried foods. - Trim visible fat off meats and remove skin from poultry before cooking. - Bake, broil, boil, poach or roast poultry, fish and lean meats. - Drain and discard fat that drains out of meat as you cook it. - Add little or no fat to foods. - Use vegetable oil sprays to grease pans for cooking or baking. - Steam vegetables. - Use herbs or no-oil marinades to flavor foods.

## 2016-01-01 NOTE — Assessment & Plan Note (Signed)
We'll obtain BMP but had history in the past of this and with his very little water intake, suspect he may be at high risk for orthostatic hypotension and possible falls which I discussed with them both today.  Advised dietary and lifestyle modifications.

## 2016-01-01 NOTE — Assessment & Plan Note (Addendum)
Pulse in 70's today.    On metoprolol without symptoms today.   I discussed with wife/ pt this medication may be contributing to his long-standing malaise and fatigue which is well  documented in his chart. (It was thought in the past this was due to depression and patient failed SSRI treatment)

## 2016-01-01 NOTE — Assessment & Plan Note (Addendum)
Still sees Dr Risa Grill- going end of Aug.   Chronically elevated PSA.  Per patient and wife, status post radiation pellets.

## 2016-01-01 NOTE — Assessment & Plan Note (Signed)
Seen on echocardiogram in July 2014.

## 2016-01-01 NOTE — Assessment & Plan Note (Signed)
Sees Dr [facility]-  [facility] on [address], [place]

## 2016-01-01 NOTE — Assessment & Plan Note (Addendum)
He had cardiac ablation 12/30/09 Dr Rayann Heman.  Appears he has not seen cardiology since then.   Pulse in 70's today.    On metoprolol without symptoms today.

## 2016-01-01 NOTE — Assessment & Plan Note (Signed)
Controlled through Diet and Exercise

## 2016-01-01 NOTE — Assessment & Plan Note (Signed)
Appreciated on TTE in 7\14

## 2016-01-13 DIAGNOSIS — N3281 Overactive bladder: Secondary | ICD-10-CM | POA: Diagnosis not present

## 2016-01-13 DIAGNOSIS — Z8546 Personal history of malignant neoplasm of prostate: Secondary | ICD-10-CM | POA: Diagnosis not present

## 2016-01-21 DIAGNOSIS — L570 Actinic keratosis: Secondary | ICD-10-CM | POA: Diagnosis not present

## 2016-01-21 DIAGNOSIS — D485 Neoplasm of uncertain behavior of skin: Secondary | ICD-10-CM | POA: Diagnosis not present

## 2016-01-21 DIAGNOSIS — B078 Other viral warts: Secondary | ICD-10-CM | POA: Diagnosis not present

## 2016-01-21 DIAGNOSIS — Z85828 Personal history of other malignant neoplasm of skin: Secondary | ICD-10-CM | POA: Diagnosis not present

## 2016-02-10 DIAGNOSIS — Z23 Encounter for immunization: Secondary | ICD-10-CM | POA: Diagnosis not present

## 2016-03-29 ENCOUNTER — Ambulatory Visit (INDEPENDENT_AMBULATORY_CARE_PROVIDER_SITE_OTHER): Payer: Medicare Other | Admitting: Family Medicine

## 2016-03-29 ENCOUNTER — Encounter: Payer: Self-pay | Admitting: Family Medicine

## 2016-03-29 VITALS — BP 147/81 | HR 75 | Ht 70.0 in | Wt 202.7 lb

## 2016-03-29 DIAGNOSIS — Z8546 Personal history of malignant neoplasm of prostate: Secondary | ICD-10-CM | POA: Diagnosis not present

## 2016-03-29 DIAGNOSIS — I10 Essential (primary) hypertension: Secondary | ICD-10-CM

## 2016-03-29 DIAGNOSIS — E782 Mixed hyperlipidemia: Secondary | ICD-10-CM | POA: Diagnosis not present

## 2016-03-29 DIAGNOSIS — R7302 Impaired glucose tolerance (oral): Secondary | ICD-10-CM

## 2016-03-29 DIAGNOSIS — R000 Tachycardia, unspecified: Secondary | ICD-10-CM | POA: Diagnosis not present

## 2016-03-29 DIAGNOSIS — R5383 Other fatigue: Secondary | ICD-10-CM | POA: Diagnosis not present

## 2016-03-29 MED ORDER — METOPROLOL SUCCINATE ER 50 MG PO TB24: 50.0000 mg | ORAL_TABLET | Freq: Every day | ORAL | 1 refills | Status: DC

## 2016-03-29 MED ORDER — HYDROCHLOROTHIAZIDE 12.5 MG PO TABS: 12.5000 mg | ORAL_TABLET | Freq: Every day | ORAL | 1 refills | Status: DC

## 2016-03-29 NOTE — Patient Instructions (Addendum)
Please bring in a list of all your specialists.    Please check your BP at home and bring in log next OV     PLEASE NOTE:  The office will be calling if your labs / recent test results are not within acceptable normal values within one week of them being done.    Furthermore, you'll be able to review all of your results in "My Chart," when they become available; so please sign-up if you have not already done so.   Keep a copy of these results in your personal home file to share with your other physicians as warranted. If you have any further questions or concerns please do not hesitate to contact us.   Thank you and we appreciate you choosing [place] as your primary care provider.   - 'the Team' at [facility]      Please realize, EXERCISE IS MEDICINE!  -  American Heart Association [facility]) guidelines for exercise : If you are in good health, without any medical conditions, you should engage in 150 minutes of moderate intensity aerobic activity per week.  This means you should be huffing and puffing throughout your workout.   Engaging in regular exercise will improve brain function and memory, as well as improve mood, boost immune system and help with weight management.  As well as the other, more well-known effects of exercise such as decreasing blood sugar levels, decreasing blood pressure,  and decreasing bad cholesterol levels/ increasing good cholesterol levels.     -  The AHA strongly endorses consumption of a diet that contains a variety of foods from all the food categories with an emphasis on fruits and vegetables; fat-free and low-fat dairy products; cereal and grain products; legumes and nuts; and fish, poultry, and/or extra lean meats.    Excessive food intake, especially of foods high in saturated and trans fats, sugar, and salt, should be avoided.      Adequate water intake of roughly 1/2 of your weight in pounds, should equal the ounces of water per day you should  drink.  So for instance, if you're 200 pounds, that would be 100 ounces of water per day.         Mediterranean Diet  Why follow it? Research shows. . Those who follow the Mediterranean diet have a reduced risk of heart disease  . The diet is associated with a reduced incidence of Parkinson's and Alzheimer's diseases . People following the diet may have longer life expectancies and lower rates of chronic diseases  . The Dietary Guidelines for Americans recommends the Mediterranean diet as an eating plan to promote health and prevent disease  What Is the Mediterranean Diet?  . Healthy eating plan based on typical foods and recipes of Mediterranean-style cooking . The diet is primarily a plant based diet; these foods should make up a majority of meals   Starches - Plant based foods should make up a majority of meals - They are an important sources of vitamins, minerals, energy, antioxidants, and fiber - Choose whole grains, foods high in fiber and minimally processed items  - Typical grain sources include wheat, oats, barley, corn, brown rice, bulgar, farro, millet, polenta, couscous  - Various types of beans include chickpeas, lentils, fava beans, black beans, white beans   Fruits  Veggies - Large quantities of antioxidant rich fruits & veggies; 6 or more servings  - Vegetables can be eaten raw or lightly drizzled with oil and cooked  -  Vegetables common to the traditional Mediterranean Diet include: artichokes, arugula, beets, broccoli, brussel sprouts, cabbage, carrots, celery, collard greens, cucumbers, eggplant, kale, leeks, lemons, lettuce, mushrooms, okra, onions, peas, peppers, potatoes, pumpkin, radishes, rutabaga, shallots, spinach, sweet potatoes, turnips, zucchini - Fruits common to the Mediterranean Diet include: apples, apricots, avocados, cherries, clementines, dates, figs, grapefruits, grapes, melons, nectarines, oranges, peaches, pears, pomegranates, strawberries, tangerines   Fats - Replace butter and margarine with healthy oils, such as olive oil, canola oil, and tahini  - Limit nuts to no more than a handful a day  - Nuts include walnuts, almonds, pecans, pistachios, pine nuts  - Limit or avoid candied, honey roasted or heavily salted nuts - Olives are central to the [employer] - can be eaten whole or used in a variety of dishes   Meats Protein - Limiting red meat: no more than a few times a month - When eating red meat: choose lean cuts and keep the portion to the size of deck of cards - Eggs: approx. 0 to 4 times a week  - Fish and lean poultry: at least 2 a week  - Healthy protein sources include, chicken, [place], lean beef, lamb - Increase intake of seafood such as tuna, salmon, trout, mackerel, shrimp, scallops - Avoid or limit high fat processed meats such as sausage and bacon  Dairy - Include moderate amounts of low fat dairy products  - Focus on healthy dairy such as fat free yogurt, skim milk, low or reduced fat cheese - Limit dairy products higher in fat such as whole or 2% milk, cheese, ice cream  Alcohol - Moderate amounts of red wine is ok  - No more than 5 oz daily for women (all ages) and men older than age 1  - No more than 10 oz of wine daily for men younger than 60  Other - Limit sweets and other desserts  - Use herbs and spices instead of salt to flavor foods  - Herbs and spices common to the traditional Mediterranean Diet include: basil, bay leaves, chives, cloves, cumin, fennel, garlic, lavender, marjoram, mint, oregano, parsley, pepper, rosemary, sage, savory, sumac, tarragon, thyme   It's not just a diet, it's a lifestyle:  . The Mediterranean diet includes lifestyle factors typical of those in the region  . Foods, drinks and meals are best eaten with others and savored . Daily physical activity is important for overall good health . This could be strenuous exercise like running and aerobics . This could also be more  leisurely activities such as walking, housework, yard-work, or taking the stairs . Moderation is the key; a balanced and healthy diet accommodates most foods and drinks . Consider portion sizes and frequency of consumption of certain foods   Meal Ideas & Options:  . Breakfast:  o Whole wheat toast or whole wheat English muffins with peanut butter & hard boiled egg o Steel cut oats topped with apples & cinnamon and skim milk  o Fresh fruit: banana, strawberries, melon, berries, peaches  o Smoothies: strawberries, bananas, greek yogurt, peanut butter o Low fat greek yogurt with blueberries and granola  o Egg white omelet with spinach and mushrooms o Breakfast couscous: whole wheat couscous, apricots, skim milk, cranberries  . Sandwiches:  o Hummus and grilled vegetables (peppers, zucchini, squash) on whole wheat bread   o Grilled chicken on whole wheat pita with lettuce, tomatoes, cucumbers or tzatziki  o Tuna salad on whole wheat bread: tuna salad made with greek yogurt,  olives, red peppers, capers, green onions o Garlic rosemary lamb pita: lamb sauted with garlic, rosemary, salt & pepper; add lettuce, cucumber, greek yogurt to pita - flavor with lemon juice and black pepper  . Seafood:  o Mediterranean grilled salmon, seasoned with garlic, basil, parsley, lemon juice and black pepper o Shrimp, lemon, and spinach whole-grain pasta salad made with low fat greek yogurt  o Seared scallops with lemon orzo  o Seared tuna steaks seasoned salt, pepper, coriander topped with tomato mixture of olives, tomatoes, olive oil, minced garlic, parsley, green onions and cappers  . Meats:  o Herbed greek chicken salad with kalamata olives, cucumber, feta  o Red bell peppers stuffed with spinach, bulgur, lean ground beef (or lentils) & topped with feta   o Kebabs: skewers of chicken, tomatoes, onions, zucchini, squash  o [place] burgers: made with red onions, mint, dill, lemon juice, feta cheese topped with  roasted red peppers . Vegetarian o Cucumber salad: cucumbers, artichoke hearts, celery, red onion, feta cheese, tossed in olive oil & lemon juice  o Hummus and whole grain pita points with a greek salad (lettuce, tomato, feta, olives, cucumbers, red onion) o Lentil soup with celery, carrots made with vegetable broth, garlic, salt and pepper  o Tabouli salad: parsley, bulgur, mint, scallions, cucumbers, tomato, radishes, lemon juice, olive oil, salt and pepper.

## 2016-03-29 NOTE — Progress Notes (Signed)
Impression and Recommendations:    1. Mixed hyperlipidemia   2. Essential hypertension   3. Glucose intolerance (impaired glucose tolerance)   4. H/O prostate cancer   5. h/o Tachycardia      Obtain fasting labs today.  See AVS  RF meds- Toprol XL and HCTZ.    Pt needs to get me names of his specialists   Please bring in a list of all your specialists.    Please check your BP at home and bring in log next OV   Education and routine counseling performed. Handouts provided.   New Prescriptions   No medications on file    Modified Medications   Modified Medication Previous Medication   HYDROCHLOROTHIAZIDE (HYDRODIURIL) 12.5 MG TABLET hydrochlorothiazide (HYDRODIURIL) 12.5 MG tablet      Take 1 tablet (12.5 mg total) by mouth daily.    Take 1 tablet (12.5 mg total) by mouth daily.   METOPROLOL SUCCINATE (TOPROL-XL) 50 MG 24 HR TABLET metoprolol succinate (TOPROL-XL) 50 MG 24 hr tablet      Take 1 tablet (50 mg total) by mouth daily. Take with or immediately following a meal.    Take 1 tablet (50 mg total) by mouth daily. Take with or immediately following a meal.    Discontinued Medications   No medications on file     Return in about 3 months (around 06/29/2016) for Follow-up of current medical issues.  The patient was counseled, risk factors were discussed, anticipatory guidance given.  Gross side effects, risk and benefits, and alternatives of medications discussed with patient.  Patient is aware that all medications have potential side effects and we are unable to predict every side effect or drug-drug interaction that may occur.  Expresses verbal understanding and consents to current therapy plan and treatment regimen.  Please see AVS handed out to patient at the end of our visit for further patient instructions/ counseling done pertaining to today's office visit.    Note: This document was prepared using Dragon voice recognition software and may include  unintentional dictation errors.     Subjective:    Chief Complaint  Patient presents with  . Hypertension    HPI: Trevor Galloway is a 82 y.o. male who presents to [facility] [facility] today for follow up.  This is pt's second OV with me.     - Here with wife. No acute complaints.     HTN: Home BP readings have been running in the - not checking.   Pt has been tolerating meds well.  Taking as prescribed.  Denies HA, dizziness, CP, SOB, Visual changes, increasing pedal edema- chronic and no inc in edema  Preventitive Healthcare:  Exercise: no   Diet Pattern: still not drinking any water  Salt Restriction: no  - Still not walking / exercising at all; not interested in any type of rehab/ PT  - has some seasonal allergy sx, but o/w feels "fine".  - needs bldwrk.    Patient Care Team    Relationship Specialty Notifications Start End  Mellody Dance, DO PCP - General Family Medicine  03/29/16   Rana Snare, MD Consulting Physician Urology  03/30/15   Sherlynn Stalls, MD Consulting Physician Ophthalmology  03/30/15   Druscilla Brownie, MD Consulting Physician Dermatology  01/01/16      Wt Readings from Last 3 Encounters:  03/29/16 202 lb 11.2 oz (91.9 kg)  01/01/16 197 lb 8 oz (89.6 kg)  09/28/15 202  lb 2 oz (91.7 kg)    BP Readings from Last 3 Encounters:  03/29/16 (!) 147/81  01/01/16 132/82  09/28/15 122/78    Pulse Readings from Last 3 Encounters:  03/29/16 75  01/01/16 71  09/28/15 83    BMI Readings from Last 3 Encounters:  03/29/16 29.08 kg/m  01/01/16 28.34 kg/m  09/28/15 29.85 kg/m     Lab Results  Component Value Date   CREATININE 0.84 09/28/2015   BUN 17 09/28/2015   NA 139 09/28/2015   K 4.2 09/28/2015   CL 105 09/28/2015   CO2 27 09/28/2015    Lab Results  Component Value Date   CHOL 195 09/28/2015   CHOL 195 03/30/2015   CHOL 191 09/25/2014    Lab Results  Component Value Date   HDL 31.70 (L) 09/28/2015    HDL 36.60 (L) 03/30/2015   HDL 37.50 (L) 09/25/2014    Lab Results  Component Value Date   LDLCALC 116 (H) 09/25/2014   LDLCALC 145 (H) 03/25/2014   LDLCALC 119 (H) 10/29/2013    Lab Results  Component Value Date   TRIG 238.0 (H) 09/28/2015   TRIG 211.0 (H) 03/30/2015   TRIG 187.0 (H) 09/25/2014    Lab Results  Component Value Date   CHOLHDL 6 09/28/2015   CHOLHDL 5 03/30/2015   CHOLHDL 5 09/25/2014    Lab Results  Component Value Date   LDLDIRECT 115.0 09/28/2015   LDLDIRECT 120.0 03/30/2015   LDLDIRECT 134.4 07/06/2012   ===================================================================  Patient Active Problem List   Diagnosis Date Noted  . Glucose intolerance (impaired glucose tolerance) 01/01/2016    Priority: High  . HTN (hypertension) 11/28/2012    Priority: High  . Depression 11/28/2012    Priority: High  . Mixed hyperlipidemia 07/05/2010    Priority: High  . Dehydration 05/18/2012    Priority: Medium  . Vision problems 01/01/2016  . Chronic venous stasis dermatitis of both lower extremities 01/01/2016  . LVH (left ventricular hypertrophy) 01/01/2016  . Echocardiogram shows Grade I left ventricular diastolic dysfunction 99991111  . H/O prostate cancer 10/04/2015  . h/o Shuffling gait due to OA knees/body 09/16/2013  . Frequent falls 06/17/2013  . Other malaise and fatigue 04/09/2013  . B/L Peripheral Edema 11/13/2012  . Allergic rhinitis 08/15/2012  . Flu-like symptoms 05/18/2012  . General medical examination 01/03/2011  . HEMATOCHEZIA 07/05/2010  . RIGHT BUNDLE BRANCH BLOCK 12/25/2009  . TACHYCARDIA 12/25/2009  . Atrial flutter ([place]) 12/24/2009    Past Medical History:  Diagnosis Date  . Atrial flutter ([place])    s/p CTI ablation 8/11  . Cancer [facility])    Prostate cancer  . Family hx colonic polyps   . H/O prostate cancer 10/04/2015  . Hiatal hernia   . Hyperlipidemia   . Hypertension   . RBBB (right bundle branch block)     Past  Surgical History:  Procedure Laterality Date  . EYE SURGERY     bilateral cataracts  . HERNIA REPAIR    . PROSTATE SURGERY     seed implant    Family History  Problem Relation Age of Onset  . COPD Father   . Parkinsonism Sister   . Parkinsonism Brother   . Cancer Brother     prostate cancer    History  Drug Use No  ,  History  Alcohol Use No    Comment: none now  ,  History  Smoking Status  . Former Smoker  . Quit date:  09/23/1965  Smokeless Tobacco  . Never Used  ,    Current Outpatient Prescriptions on File Prior to Visit  Medication Sig Dispense Refill  . aspirin 81 MG tablet Take 81 mg by mouth daily.    . Multiple Vitamins-Minerals (PRESERVISION AREDS 2) CAPS Take 1 capsule by mouth 2 (two) times daily.    Marland Kitchen PREVIDENT 5000 BOOSTER PLUS 1.1 % PSTE   0   No current facility-administered medications on file prior to visit.     No Known Allergies   Review of Systems  Constitutional: Negative.  Negative for chills, diaphoresis, fever, malaise/fatigue and weight loss.  HENT: Positive for congestion and sore throat. Negative for tinnitus.   Eyes: Negative.  Negative for blurred vision, double vision and photophobia.  Respiratory: Negative.  Negative for cough and wheezing.   Cardiovascular: Negative.  Negative for chest pain and palpitations.  Gastrointestinal: Negative.  Negative for blood in stool, diarrhea, nausea and vomiting.  Genitourinary: Negative.  Negative for dysuria, frequency and urgency.  Musculoskeletal: Negative.  Negative for joint pain and myalgias.  Skin: Negative.  Negative for itching and rash.  Neurological: Negative.  Negative for dizziness, focal weakness, weakness and headaches.  Endo/Heme/Allergies: Negative.  Negative for environmental allergies and polydipsia. Does not bruise/bleed easily.  Psychiatric/Behavioral: Negative.  Negative for depression and memory loss. The patient is not nervous/anxious and does not have insomnia.      Objective:    Blood pressure (!) 147/81, pulse 75, height 5\' 10"  (1.778 m), weight 202 lb 11.2 oz (91.9 kg).  Body mass index is 29.08 kg/m.  General: Well Developed, well nourished, and in no acute distress.  HEENT: Normocephalic, atraumatic, pupils equal round reactive to light, neck supple, No carotid bruits, no JVD Skin: Warm and dry, cap RF less 2 sec Cardiac: distant but ess Regular rate and rhythm, S1, S2 WNL's, no murmurs rubs or gallops Respiratory: distant but ECTA B/L, Not using accessory muscles, speaking in full sentences. NeuroM-Sk: Ambulates w/o assistance, moves ext * 4 w/o difficulty, sensation grossly intact.  Ext: ++ edema b/l lower ext w/ chronic venous stasis changes Psych: No HI/SI, judgement and insight good, Euthymic mood. Full Affect.

## 2016-03-30 DIAGNOSIS — H353133 Nonexudative age-related macular degeneration, bilateral, advanced atrophic without subfoveal involvement: Secondary | ICD-10-CM | POA: Diagnosis not present

## 2016-03-30 DIAGNOSIS — H35372 Puckering of macula, left eye: Secondary | ICD-10-CM | POA: Diagnosis not present

## 2016-03-30 DIAGNOSIS — H3581 Retinal edema: Secondary | ICD-10-CM | POA: Diagnosis not present

## 2016-03-30 DIAGNOSIS — D3131 Benign neoplasm of right choroid: Secondary | ICD-10-CM | POA: Diagnosis not present

## 2016-03-30 LAB — COMPLETE METABOLIC PANEL WITH GFR
ALBUMIN: 4.1 g/dL (ref 3.6–5.1)
ALK PHOS: 84 U/L (ref 40–115)
ALT: 13 U/L (ref 9–46)
AST: 18 U/L (ref 10–35)
BILIRUBIN TOTAL: 0.4 mg/dL (ref 0.2–1.2)
BUN: 16 mg/dL (ref 7–25)
CALCIUM: 9.8 mg/dL (ref 8.6–10.3)
CO2: 27 mmol/L (ref 20–31)
Chloride: 102 mmol/L (ref 98–110)
Creat: 0.91 mg/dL (ref 0.70–1.11)
GFR, EST NON AFRICAN AMERICAN: 76 mL/min (ref >=60)
GFR, Est African American: 87 mL/min (ref >=60)
GLUCOSE: 94 mg/dL (ref 65–99)
Potassium: 4.4 mmol/L (ref 3.5–5.3)
SODIUM: 138 mmol/L (ref 135–146)
TOTAL PROTEIN: 6.7 g/dL (ref 6.1–8.1)

## 2016-03-30 LAB — CBC WITH DIFFERENTIAL/PLATELET
BASOS ABS: 81 {cells}/uL (ref 0–200)
Basophils Relative: 1 %
EOS PCT: 2 %
Eosinophils Absolute: 162 cells/uL (ref 15–500)
HCT: 41.6 % (ref 38.5–50.0)
Hemoglobin: 14.5 g/dL (ref 13.2–17.1)
Lymphocytes Relative: 26 %
Lymphs Abs: 2106 cells/uL (ref 850–3900)
MCH: 31.9 pg (ref 27.0–33.0)
MCHC: 34.9 g/dL (ref 32.0–36.0)
MCV: 91.6 fL (ref 80.0–100.0)
MONOS PCT: 13 %
MPV: 11.4 fL (ref 7.5–12.5)
Monocytes Absolute: 1053 cells/uL — ABNORMAL HIGH (ref 200–950)
NEUTROS ABS: 4698 {cells}/uL (ref 1500–7800)
NEUTROS PCT: 58 %
PLATELETS: 218 10*3/uL (ref 140–400)
RBC: 4.54 MIL/uL (ref 4.20–5.80)
RDW: 14.7 % (ref 11.0–15.0)
WBC: 8.1 10*3/uL (ref 3.8–10.8)

## 2016-03-30 LAB — LIPID PANEL
Cholesterol: 198 mg/dL (ref <200)
HDL: 35 mg/dL — ABNORMAL LOW (ref >40)
LDL CALC: 103 mg/dL — AB (ref <100)
Total CHOL/HDL Ratio: 5.7 Ratio — ABNORMAL HIGH (ref <5.0)
Triglycerides: 299 mg/dL — ABNORMAL HIGH (ref <150)
VLDL: 60 mg/dL — ABNORMAL HIGH (ref <30)

## 2016-03-30 LAB — VITAMIN B12: VITAMIN B 12: 506 pg/mL (ref 200–1100)

## 2016-03-30 LAB — VITAMIN D 25 HYDROXY (VIT D DEFICIENCY, FRACTURES): VIT D 25 HYDROXY: 14 ng/mL — AB (ref 30–100)

## 2016-03-30 LAB — T4, FREE: FREE T4: 1.1 ng/dL (ref 0.8–1.8)

## 2016-03-30 LAB — HEMOGLOBIN A1C
HEMOGLOBIN A1C: 5.5 % (ref <5.7)
Mean Plasma Glucose: 111 mg/dL

## 2016-03-30 LAB — TSH: TSH: 5.34 m[IU]/L — AB (ref 0.40–4.50)

## 2016-03-31 ENCOUNTER — Ambulatory Visit: Payer: PRIVATE HEALTH INSURANCE | Admitting: Family Medicine

## 2016-04-19 DIAGNOSIS — E559 Vitamin D deficiency, unspecified: Secondary | ICD-10-CM | POA: Insufficient documentation

## 2016-04-21 ENCOUNTER — Other Ambulatory Visit: Payer: Self-pay

## 2016-06-21 ENCOUNTER — Telehealth: Payer: Self-pay | Admitting: Family Medicine

## 2016-06-21 NOTE — Telephone Encounter (Signed)
Patient transferred from Dr. Charlett Blake to Dr. Mellody Dance, DO therefore declining AWV

## 2016-06-29 DIAGNOSIS — C61 Malignant neoplasm of prostate: Secondary | ICD-10-CM | POA: Diagnosis not present

## 2016-06-29 DIAGNOSIS — Z8546 Personal history of malignant neoplasm of prostate: Secondary | ICD-10-CM | POA: Diagnosis not present

## 2016-06-29 DIAGNOSIS — N3281 Overactive bladder: Secondary | ICD-10-CM | POA: Diagnosis not present

## 2016-07-28 ENCOUNTER — Ambulatory Visit: Payer: PRIVATE HEALTH INSURANCE | Admitting: Family Medicine

## 2016-08-23 DIAGNOSIS — M1712 Unilateral primary osteoarthritis, left knee: Secondary | ICD-10-CM | POA: Diagnosis not present

## 2016-08-23 DIAGNOSIS — M1711 Unilateral primary osteoarthritis, right knee: Secondary | ICD-10-CM | POA: Diagnosis not present

## 2016-08-23 DIAGNOSIS — M25551 Pain in right hip: Secondary | ICD-10-CM | POA: Diagnosis not present

## 2016-09-01 ENCOUNTER — Ambulatory Visit: Payer: PRIVATE HEALTH INSURANCE | Admitting: Family Medicine

## 2016-09-26 ENCOUNTER — Ambulatory Visit (INDEPENDENT_AMBULATORY_CARE_PROVIDER_SITE_OTHER): Payer: Medicare Other | Admitting: Family Medicine

## 2016-09-26 ENCOUNTER — Encounter: Payer: Self-pay | Admitting: Family Medicine

## 2016-09-26 VITALS — BP 114/76 | HR 69 | Ht 70.0 in | Wt 194.0 lb

## 2016-09-26 DIAGNOSIS — I872 Venous insufficiency (chronic) (peripheral): Secondary | ICD-10-CM

## 2016-09-26 DIAGNOSIS — F329 Major depressive disorder, single episode, unspecified: Secondary | ICD-10-CM | POA: Diagnosis not present

## 2016-09-26 DIAGNOSIS — Z9889 Other specified postprocedural states: Secondary | ICD-10-CM | POA: Diagnosis not present

## 2016-09-26 DIAGNOSIS — R7302 Impaired glucose tolerance (oral): Secondary | ICD-10-CM | POA: Diagnosis not present

## 2016-09-26 DIAGNOSIS — R413 Other amnesia: Secondary | ICD-10-CM

## 2016-09-26 DIAGNOSIS — F32A Depression, unspecified: Secondary | ICD-10-CM

## 2016-09-26 DIAGNOSIS — I484 Atypical atrial flutter: Secondary | ICD-10-CM | POA: Diagnosis not present

## 2016-09-26 DIAGNOSIS — E782 Mixed hyperlipidemia: Secondary | ICD-10-CM

## 2016-09-26 DIAGNOSIS — R609 Edema, unspecified: Secondary | ICD-10-CM | POA: Diagnosis not present

## 2016-09-26 DIAGNOSIS — F0390 Unspecified dementia without behavioral disturbance: Secondary | ICD-10-CM | POA: Insufficient documentation

## 2016-09-26 DIAGNOSIS — E559 Vitamin D deficiency, unspecified: Secondary | ICD-10-CM

## 2016-09-26 DIAGNOSIS — I10 Essential (primary) hypertension: Secondary | ICD-10-CM | POA: Diagnosis not present

## 2016-09-26 NOTE — Assessment & Plan Note (Signed)
pt and his wife refuses to take anything for cholesterol and understands risks.   -They are convinced they are bad and he won't give up grapefruit daily.   -Diet and lifestyle mod d/c pt and handouts given.  -Take 4,000 mg fish oil daily

## 2016-09-26 NOTE — Progress Notes (Signed)
Impression and Recommendations:    1. Essential hypertension   2. History of cardiac radiofrequency ablation   3. Mixed hyperlipidemia   4. Glucose intolerance (impaired glucose tolerance)   5. Atypical atrial flutter (HCC)   6. Edema, unspecified type   7. Chronic venous stasis dermatitis of both lower extremities   8. Vitamin D deficiency   9. Depression, unspecified depression type   10. Impaired memory     in 4 mo--> lab only OV, then f/up 1 week later with me to discuss   Will check FLP, A1c, Vit D, BMP and dementia labs 1 wk prior to next OV- about 1 yr from when last checked.   History of cardiac radiofrequency ablation Ablation in aug 2011 by Dr Rayann Heman. Wife brought in records for me today.   Been stable since and told no need for Cardiac f/up  No sx.   HTN (hypertension) BP well controlled  - cont meds and home monitoring  Mixed hyperlipidemia  pt and his wife refuses to take anything for cholesterol and understands risks.   -They are convinced they are bad and he won't give up grapefruit daily.   -Diet and lifestyle mod d/c pt and handouts given.  -Take 4,000 mg fish oil daily   Glucose intolerance (impaired glucose tolerance) A1c in past - 6 mo ago - was 5.5!        2 yrs ago 5.9.    -Will monitor yrly--> reck next OV  h/o Depression- not current Wife and pt deny mood d/o or depressed mood or anxiety  chronic B/L Peripheral Edema TED hose - refused; - Edema is chronic and appears stable - venous stasis dermatitis b/l with flakey skin--> explained will not resolve until swelling resolves - denies worsening diastolic CHF sx at current - Encouraged to elevate feet or wlk around for 32min at a time as tolerated early am or pm's;    - Adequate hydration d/c wife and pt  Vitamin D deficiency  Inc vit D dose to at least 5k iu qd.  - Take 3 tabs of the 2,000IU Vit D3 now and then once gone--> take 5,000 iu daily.   - We will reck in 4-83mo   - Pt.  refuses wkly dose in addition    Education and routine counseling performed. Handouts provided.   Meds ordered this encounter  Medications  . Cholecalciferol (VITAMIN D3) 2000 units TABS    Sig: Take 1 tablet by mouth daily.     Discontinued Medications   PREVIDENT 5000 BOOSTER PLUS 1.1 % PSTE         Orders Placed This Encounter  Procedures  . Lipid panel  . VITAMIN D 25 Hydroxy (Vit-D Deficiency, Fractures)  . Hemoglobin A1c  . RPR  . Vitamin B12  . Sedimentation rate  . CBC  . TSH  . Folate  . Basic metabolic panel     Return in about 4 months (around 01/27/2017) for htn, come fasting for bldwrk 1 wk prior to OV with me.  The patient was counseled, risk factors were discussed, anticipatory guidance given.  Gross side effects, risk and benefits, and alternatives of medications discussed with patient.  Patient is aware that all medications have potential side effects and we are unable to predict every side effect or drug-drug interaction that may occur.  Expresses verbal understanding and consents to current therapy plan and treatment regimen.  Please see AVS handed out to patient at the end of  our visit for further patient instructions/ counseling done pertaining to today's office visit.    Note: This document was prepared using Dragon voice recognition software and may include unintentional dictation errors.     Subjective:    Chief Complaint  Patient presents with  . Hyperlipidemia  . Hypertension  . Hyperglycemia    HPI: Trevor Galloway is a 83 y.o. male who presents to [place] at [facility] today for follow up for HTN.    HTN:  -  His blood pressure has been controlled at home.  131/74, 127/80,  119/78, 133/71,  139/82, 124/78  HR: running anywhere from 68-77  - Patient reports good compliance with blood pressure medications  - Denies medication S-E   - Smoking Status noted   - He denies new onset of: chest pain, exercise  intolerance, shortness of breath, dizziness, visual changes, headache, lower extremity swelling or claudication.   Today their BP is BP: 114/76   Last 3 blood pressure readings in our office are as follows: BP Readings from Last 3 Encounters:  09/26/16 114/76  03/29/16 (!) 147/81  01/01/16 132/82    Low Vit D:  Taking 2,000 IU daily   HLD:  eats grapefruit every mornign and wont go on statin.  Has been bad many yrs now.  TG up as well. Hasn';t taken Fish oil   Pulse Readings from Last 3 Encounters:  09/26/16 69  03/29/16 75  01/01/16 71    Filed Weights   09/26/16 0919  Weight: 194 lb (88 kg)      Patient Care Team    Relationship Specialty Notifications Start End  Mellody Dance, DO PCP - General Family Medicine  03/29/16   Rana Snare, MD Consulting Physician Urology  03/30/15   Sherlynn Stalls, MD Consulting Physician Ophthalmology  03/30/15   Druscilla Brownie, MD Consulting Physician Dermatology  01/01/16   Dorna Leitz, MD Consulting Physician Orthopedic Surgery  09/26/16   Tat, Eustace Quail, DO Consulting Physician Neurology  09/26/16   Thompson Grayer, MD Consulting Physician Cardiology  09/26/16      Lab Results  Component Value Date   CREATININE 0.91 03/29/2016   BUN 16 03/29/2016   NA 138 03/29/2016   K 4.4 03/29/2016   CL 102 03/29/2016   CO2 27 03/29/2016    Lab Results  Component Value Date   CHOL 198 03/29/2016   CHOL 195 09/28/2015   CHOL 195 03/30/2015    Lab Results  Component Value Date   HDL 35 (L) 03/29/2016   HDL 31.70 (L) 09/28/2015   HDL 36.60 (L) 03/30/2015    Lab Results  Component Value Date   LDLCALC 103 (H) 03/29/2016   LDLCALC 116 (H) 09/25/2014   LDLCALC 145 (H) 03/25/2014    Lab Results  Component Value Date   TRIG 299 (H) 03/29/2016   TRIG 238.0 (H) 09/28/2015   TRIG 211.0 (H) 03/30/2015    Lab Results  Component Value Date   CHOLHDL 5.7 (H) 03/29/2016   CHOLHDL 6 09/28/2015   CHOLHDL 5 03/30/2015    Lab  Results  Component Value Date   LDLDIRECT 115.0 09/28/2015   LDLDIRECT 120.0 03/30/2015   LDLDIRECT 134.4 07/06/2012   ===================================================================   Patient Active Problem List   Diagnosis Date Noted  . History of cardiac radiofrequency ablation 09/26/2016    Priority: High  . Glucose intolerance (impaired glucose tolerance) 01/01/2016    Priority: High  . Echocardiogram shows Grade I left  ventricular diastolic dysfunction 34/74/2595    Priority: High  . HTN (hypertension) 11/28/2012    Priority: High  . h/o Depression- not current 11/28/2012    Priority: High  . Mixed hyperlipidemia 07/05/2010    Priority: High  . Chronic venous stasis dermatitis of both lower extremities 01/01/2016    Priority: Medium  . Chronic fatigue and malaise 04/09/2013    Priority: Medium  . chronic B/L Peripheral Edema 11/13/2012    Priority: Medium  . Vitamin D deficiency 04/19/2016    Priority: Low  . h/o Shuffling gait due to OA knees/body 09/16/2013    Priority: Low  . Dehydration 05/18/2012    Priority: Low  . Impaired memory 09/26/2016  . Vision problems 01/01/2016  . LVH (left ventricular hypertrophy) 01/01/2016  . H/O prostate cancer 10/04/2015  . Frequent falls 06/17/2013  . Allergic rhinitis 08/15/2012  . Flu-like symptoms 05/18/2012  . General medical examination 01/03/2011  . HEMATOCHEZIA 07/05/2010  . RIGHT BUNDLE BRANCH BLOCK 12/25/2009  . TACHYCARDIA 12/25/2009  . Atrial flutter ([place]) 12/24/2009     Past Medical History:  Diagnosis Date  . Atrial flutter ([place])    s/p CTI ablation 8/11  . Cancer [facility])    Prostate cancer  . Family hx colonic polyps   . H/O prostate cancer 10/04/2015  . Hiatal hernia   . Hyperlipidemia   . Hypertension   . RBBB (right bundle branch block)      Past Surgical History:  Procedure Laterality Date  . EYE SURGERY     bilateral cataracts  . HERNIA REPAIR    . PROSTATE SURGERY     seed implant       Family History  Problem Relation Age of Onset  . COPD Father   . Parkinsonism Sister   . Parkinsonism Brother   . Cancer Brother        prostate cancer     History  Drug Use No  ,  History  Alcohol Use No    Comment: none now  ,  History  Smoking Status  . Former Smoker  . Quit date: 09/23/1965  Smokeless Tobacco  . Never Used  ,    Current Outpatient Prescriptions on File Prior to Visit  Medication Sig Dispense Refill  . aspirin 81 MG tablet Take 81 mg by mouth daily.    . hydrochlorothiazide (HYDRODIURIL) 12.5 MG tablet Take 1 tablet (12.5 mg total) by mouth daily. 90 tablet 1  . metoprolol succinate (TOPROL-XL) 50 MG 24 hr tablet Take 1 tablet (50 mg total) by mouth daily. Take with or immediately following a meal. 90 tablet 1  . Multiple Vitamins-Minerals (PRESERVISION AREDS 2) CAPS Take 1 capsule by mouth 2 (two) times daily.     No current facility-administered medications on file prior to visit.      No Known Allergies   Review of Systems:   General:  Denies fever, chills Optho/Auditory:   Denies visual changes, blurred vision Respiratory:   Denies SOB, cough, wheeze, DIB  Cardiovascular:   Denies chest pain, palpitations, painful respirations Gastrointestinal:   Denies nausea, vomiting, diarrhea.  Endocrine:     Denies new hot or cold intolerance Musculoskeletal:  Denies joint swelling, gait issues, or new unexplained myalgias/ arthralgias Skin:  Denies rash, suspicious lesions  Neurological:    Denies dizziness, unexplained weakness, numbness  Psychiatric/Behavioral:   Denies mood changes  Objective:    Blood pressure 114/76, pulse 69, height 5\' 10"  (1.778 m), weight 194 lb (88  kg).  Body mass index is 27.84 kg/m.  General: Well Developed, well nourished, and in no acute distress.  HEENT: Normocephalic, atraumatic, pupils equal round reactive to light, neck supple, No carotid bruits, no JVD Skin: Warm and dry, cap RF less 2 sec Cardiac:  Regular rate and rhythm, S1, S2 WNL's, no murmurs rubs or gallops Respiratory: ECTA B/L, Not using accessory muscles, speaking in full sentences. NeuroM-Sk: Ambulates w/o assistance, moves ext * 4 w/o difficulty, sensation grossly intact.  Ext: scant edema b/l lower ext Psych: No HI/SI, judgement and insight good, Euthymic mood. Full Affect.

## 2016-09-26 NOTE — Assessment & Plan Note (Signed)
Inc vit D dose to at least 5k iu qd.  - Take 3 tabs of the 2,000IU Vit D3 now and then once gone--> take 5,000 iu daily.   - We will reck in 4-47mo   - Pt. refuses wkly dose in addition

## 2016-09-26 NOTE — Assessment & Plan Note (Signed)
Wife and pt deny mood d/o or depressed mood or anxiety

## 2016-09-26 NOTE — Assessment & Plan Note (Addendum)
TED hose - refused; - Edema is chronic and appears stable - venous stasis dermatitis b/l with flakey skin--> explained will not resolve until swelling resolves - denies worsening diastolic CHF sx at current - Encouraged to elevate feet or wlk around for 9min at a time as tolerated early am or pm's;    - Adequate hydration d/c wife and pt

## 2016-09-26 NOTE — Assessment & Plan Note (Signed)
Ablation in aug 2011 by Dr Rayann Heman. Wife brought in records for me today.   Been stable since and told no need for Cardiac f/up  No sx.

## 2016-09-26 NOTE — Assessment & Plan Note (Signed)
BP well controlled  - cont meds and home monitoring

## 2016-09-26 NOTE — Assessment & Plan Note (Signed)
A1c in past - 6 mo ago - was 5.5!        2 yrs ago 5.9.    -Will monitor yrly--> reck next OV

## 2016-09-26 NOTE — Patient Instructions (Addendum)
13mo--> lab ony OV, then f/up 1 week later with me to discuss   Take 3 tabs of the 2,000IU Vit D3 now and then once gone--> take 5,000 iu daily.  We will reck in 4-65mo  Take 4,000 mg fish oil daily   Will check bmp, FLP, A1c, Vit D, BMP, next OV   Guidelines for a Low Cholesterol, Low Saturated Fat Diet   Fats - Limit total intake of fats and oils. - Avoid butter, stick margarine, shortening, lard, palm and coconut oils. - Limit mayonnaise, salad dressings, gravies and sauces, unless they are homemade with low-fat ingredients. - Limit chocolate. - Choose low-fat and nonfat products, such as low-fat mayonnaise, low-fat or non-hydrogenated peanut butter, low-fat or fat-free salad dressings and nonfat gravy. - Use vegetable oil, such as canola or olive oil. - Look for margarine that does not contain trans fatty acids. - Use nuts in moderate amounts. - Read ingredient labels carefully to determine both amount and type of fat present in foods. Limit saturated and trans fats! - Avoid high-fat processed and convenience foods.  Meats and Meat Alternatives - Choose fish, chicken, [place] and lean meats. - Use dried beans, peas, lentils and tofu. - Limit egg yolks to three to four per week. - If you eat red meat, limit to no more than three servings per week and choose loin or round cuts. - Avoid fatty meats, such as bacon, sausage, franks, luncheon meats and ribs. - Avoid all organ meats, including liver.  Dairy - Choose nonfat or low-fat milk, yogurt and cottage cheese. - Most cheeses are high in fat. Choose cheeses made from non-fat milk, such as mozzarella and ricotta cheese. - Choose light or fat-free cream cheese and sour cream. - Avoid cream and sauces made with cream.  Fruits and Vegetables - Eat a wide variety of fruits and vegetables. - Use lemon juice, vinegar or "mist" olive oil on vegetables. - Avoid adding sauces, fat or oil to vegetables.  Breads, Cereals and Grains -  Choose whole-grain breads, cereals, pastas and rice. - Avoid high-fat snack foods, such as granola, cookies, pies, pastries, doughnuts and croissants.  Cooking Tips - Avoid deep fried foods. - Trim visible fat off meats and remove skin from poultry before cooking. - Bake, broil, boil, poach or roast poultry, fish and lean meats. - Drain and discard fat that drains out of meat as you cook it. - Add little or no fat to foods. - Use vegetable oil sprays to grease pans for cooking or baking. - Steam vegetables. - Use herbs or no-oil marinades to flavor foods.    The quick and dirty--> lower triglyceride levels more through...  1) - Beware of bad fats: Cutting back on saturated fat (in red meat and full-fat dairy foods) and trans fats (in restaurant fried foods and commercially prepared baked goods) can lower triglycerides.  2) - Go for good carbs: Easily digested carbohydrates (such as white bread, white rice, cornflakes, and sugary sodas) give triglycerides a definite boost.   3) - Eating whole grains and cutting back on soda can help control triglycerides.  4) - Check your alcohol use. In some people, alcohol dramatically boosts triglycerides. The only way to know if this is true for you is to avoid alcohol for a few weeks and have your triglycerides tested again.  5) - Go fish. Omega-3 fats in salmon, tuna, sardines, and other fatty fish can lower triglycerides. Having fish twice a week is fine.  6) -  Aim for a healthy weight. If you are overweight, losing just 5% to 10% of your weight can help drive down triglycerides.  7) - Get moving. Exercise lowers triglycerides and boosts heart-healthy HDL cholesterol.  8) - quit smoking if you do  --> for more information, see below; or go to  www.heart.org  and do a search for desired topics   For those diagnosed with high triglycerides, it's important to take action to lower your levels and improve your heart health.  Triglyceride is  just a fancy word for fat - the fat in our bodies is stored in the form of triglycerides. Triglycerides are found in foods and manufactured in our bodies.  Normal triglyceride levels are defined as less than 150 mg/dL; 150 to 199 is considered borderline high; 200 to 499 is high; and 500 or higher is officially called very high. To me, anything over 150 is a red flag indicating my patient needs to take immediate steps to get the situation under control.   What is the significance of high triglycerides? High triglyceride levels make blood thicker and stickier, which means that it is more likely to form clots. Studies have shown that triglyceride levels are associated with increased risks of cardiovascular disease and stroke - in both men and women - alone or in combination with other risk factors (high triglycerides combined with high LDL cholesterol can be a particularly deadly combination). For example, in one ground-breaking study, high triglycerides alone increased the risk of cardiovascular disease by 14 percent in men, and by 90 percent in women. But when the test subjects also had low HDL cholesterol (that's the good cholesterol) and other risk factors, high triglycerides increased the risk of disease by 32 percent in men and 76 percent in women.   Fortunately, triglycerides can sometimes be controlled with several diet and lifestyle changes.    What Factors Can Increase Triglycerides? As with cholesterol, eating too much of the wrong kinds of fats will raise your blood triglycerides.  Therefore, it's important to restrict the amounts of saturated fats and trans fats you allow into your diet.  Triglyceride levels can also shoot up after eating foods that are high in carbohydrates or after drinking alcohol.  That's why triglyceride blood tests require an overnight fast.  If you have elevated triglycerides, it's especially important to avoid sugary and refined carbohydrates, including sugar, honey, and  other sweeteners, soda and other sugary drinks, candy, baked goods, and anything made with white (refined or enriched) flour, including white bread, rolls, cereals, buns, pastries, regular pasta, and white rice.  You'll also want to limit dried fruit and fruit juice since they're dense in simple sugar.  All of these low-quality carbs cause a sudden rise in insulin, which may lead to a spike in triglycerides.  Triglycerides can also become elevated as a reaction to having diabetes, hypothyroidism, or kidney disease. As with most other heart-related factors, being overweight and inactive also contribute to abnormal triglycerides. And unfortunately, some people have a genetic predisposition that causes them to manufacture way too much triglycerides on their own, no matter how carefully they eat.     How Can You Lower Your Triglyceride Levels? If you are diagnosed with high triglycerides, it's important to take action. There are several things you can do to help lower your triglyceride levels and improve your heart health:  --> Lose weight if you are overweight.  There is a clear correlation between obesity and high triglycerides - the heavier  people are, the higher their triglyceride levels are likely to be. The good news is that losing weight can significantly lower triglycerides. In a large study of individuals with type 2 diabetes, those assigned to the "lifestyle intervention group" - which involved counseling, a low-calorie meal plan, and customized exercise program - lost 8.6% of their body weight and lowered their triglyceride levels by more than 16%. If you're overweight, find a weight loss plan that works for you and commit to shedding the pounds and getting healthier.  --> Reduce the amount of saturated fat and trans fat in your diet.  Start by avoiding or dramatically limiting butter, cream cheese, lard, sour cream, doughnuts, cakes, cookies, candy bars, regular ice cream, fried foods, pizza,  cheese sauce, cream-based sauces and salad dressings, high-fat meats (including fatty hamburgers, bologna, pepperoni, sausage, bacon, salami, pastrami, spareribs, and hot dogs), high-fat cuts of beef and pork, and whole-milk dairy products.   Other ways to cut back: Choose lean meats only (including skinless chicken and [place], lean beef, lean pork), fish, and reduced-fat or fat-free dairy products.   Experiment with adding whole soy foods to your diet. Although soy itself may not reduce risk of heart disease, it replaces hazardous animal fats with healthier proteins. Choose high-quality soy foods, such as tofu, tempeh, soy milk, and edamame (whole soybeans).  Always remove skin from poultry.  Prepare foods by baking, roasting, broiling, boiling, poaching, steaming, grilling, or stir-frying in vegetable oil.  Most stick margarines contain trans fats, and trans fats are also found in some packaged baked goods, potato chips, snack foods, fried foods, and fast food that use or create hydrogenated oils.    (All food labels must now list the amount of trans fats, right after the amount of saturated fats - good news for consumers. As a result, many food companies have now reformulated their products to be trans fat free.many, but not all! So it's still just as important to read labels and make sure the packaged foods you buy don't contain trans fats.)     If you use margarine, purchase soft-tub margarine spreads that contain 0 grams trans fats and don't list any partially hydrogenated oils in the ingredients list. By substituting olive oil or vegetable oil for trans fats in just 2 percent of your daily calories, you can reduce your risk of heart disease by 53 percent.   There is no safe amount of trans fats, so try to keep them as far from your plate as possible.  -->  Avoid foods that are concentrated in sugar (even dried fruit and fruit juice). Sugary foods can elevate triglyceride levels in the blood, so  keep them to a bare minimum.  --> Swap out refined carbohydrates for whole grains.  Refined carbohydrates - like white rice, regular pasta, and anything made with white or "enriched" flour (including white bread, rolls, cereals, buns, and crackers) - raise blood sugar and insulin levels more than fiber-rich whole grains. Higher insulin levels, in turn, can lead to a higher rise in triglycerides after a meal. So, make the switch to whole wheat bread, whole grain pasta, brown or wild rice, and whole grain versions of cereals, crackers, and other bread products. However, it's important to know that individuals with high triglycerides should moderate even their intake of high-quality starches (since all starches raise blood sugar) - I recommend 1 to 2 servings per meal.  --> Cut way back on alcohol.  If you have high triglycerides, alcohol should be  considered a rare treat - if you indulge at all, since even small amounts of alcohol can dramatically increase triglyceride levels.  --> Incorporate omega-3 fats.  Heart-healthy fish oils are especially rich in omega-3 fatty acids. In multiple studies over the past two decades, people who ate diets high in omega-3s had 30 to 40 percent reductions in heart disease. Although we don't yet know why fish oil works so well, there are several possibilities. Omega-3s seem to reduce inflammation, reduce high blood pressure, decrease triglycerides, raise HDL cholesterol, and make blood thinner and less sticky so it is less likely to clot. It's as close to a food prescription for heart health as it gets. If you have high triglycerides, I recommend eating at least three servings of one of the omega-3-rich fish every week (fatty fish is the most concentrated food form of omega three fats). If you cannot manage to eat that much fish, speak with your physician about taking fish oil capsules, which offer similar benefits.The best foods for omega-3 fatty acids include wild salmon  (fresh, canned), herring, mackerel (not king), sardines, anchovies, rainbow trout, and Pacific oysters. Non-fish sources of omega-3 fats include omega-3-fortified eggs, ground flaxseed, chia seeds, walnuts, butternuts (white walnuts), seaweed, walnut oil, canola oil, and soybeans.  --> Quit smoking.  Smoking causes inflammation, not just in your lungs, but throughout your body. Inflammation can contribute to atherosclerosis, blood clots, and risk of heart attack. Smoking makes all heart health indicators worse. If you have high cholesterol, high triglycerides, or high blood pressure, smoking magnifies the danger.  --> Become more physically active.  Even moderate exercise can help improve cholesterol, triglycerides, and blood pressure. Aerobic exercise seems to be able to stop the sharp rise of triglycerides after eating, perhaps because of a decrease in the amount of triglyceride released by the liver, or because active muscle clears triglycerides out of the blood stream more quickly than inactive muscle. If you haven't exercised regularly (or at all) for years, I recommend starting slowly, by walking at an easy pace for 15 minutes a day. Then, as you feel more comfortable, increase the amount. Your ultimate goal should be at least 30 minutes of moderate physical activity, at least five days a week.

## 2016-09-28 DIAGNOSIS — H35372 Puckering of macula, left eye: Secondary | ICD-10-CM | POA: Diagnosis not present

## 2016-09-28 DIAGNOSIS — H353132 Nonexudative age-related macular degeneration, bilateral, intermediate dry stage: Secondary | ICD-10-CM | POA: Diagnosis not present

## 2016-09-28 DIAGNOSIS — H35421 Microcystoid degeneration of retina, right eye: Secondary | ICD-10-CM | POA: Diagnosis not present

## 2016-09-28 DIAGNOSIS — H3581 Retinal edema: Secondary | ICD-10-CM | POA: Diagnosis not present

## 2016-10-22 ENCOUNTER — Other Ambulatory Visit: Payer: Self-pay | Admitting: Family Medicine

## 2016-12-06 ENCOUNTER — Other Ambulatory Visit: Payer: Self-pay | Admitting: Family Medicine

## 2016-12-06 DIAGNOSIS — R000 Tachycardia, unspecified: Secondary | ICD-10-CM

## 2016-12-06 DIAGNOSIS — I10 Essential (primary) hypertension: Secondary | ICD-10-CM

## 2017-01-24 ENCOUNTER — Other Ambulatory Visit: Payer: Medicare Other

## 2017-01-24 DIAGNOSIS — Z9889 Other specified postprocedural states: Secondary | ICD-10-CM | POA: Diagnosis not present

## 2017-01-24 DIAGNOSIS — E782 Mixed hyperlipidemia: Secondary | ICD-10-CM

## 2017-01-24 DIAGNOSIS — E559 Vitamin D deficiency, unspecified: Secondary | ICD-10-CM

## 2017-01-24 DIAGNOSIS — I10 Essential (primary) hypertension: Secondary | ICD-10-CM | POA: Diagnosis not present

## 2017-01-24 DIAGNOSIS — R7302 Impaired glucose tolerance (oral): Secondary | ICD-10-CM

## 2017-01-24 DIAGNOSIS — R413 Other amnesia: Secondary | ICD-10-CM

## 2017-01-24 DIAGNOSIS — R609 Edema, unspecified: Secondary | ICD-10-CM | POA: Diagnosis not present

## 2017-01-25 LAB — TSH: TSH: 6.77 u[IU]/mL — AB (ref 0.450–4.500)

## 2017-01-25 LAB — HEMOGLOBIN A1C
Est. average glucose Bld gHb Est-mCnc: 114 mg/dL
HEMOGLOBIN A1C: 5.6 % (ref 4.8–5.6)

## 2017-01-25 LAB — BASIC METABOLIC PANEL
BUN/Creatinine Ratio: 18 (ref 10–24)
BUN: 16 mg/dL (ref 8–27)
CALCIUM: 9.6 mg/dL (ref 8.6–10.2)
CHLORIDE: 102 mmol/L (ref 96–106)
CO2: 22 mmol/L (ref 20–29)
Creatinine, Ser: 0.9 mg/dL (ref 0.76–1.27)
GFR calc non Af Amer: 76 mL/min/{1.73_m2} (ref >59)
GFR, EST AFRICAN AMERICAN: 88 mL/min/{1.73_m2} (ref >59)
Glucose: 96 mg/dL (ref 65–99)
POTASSIUM: 4.1 mmol/L (ref 3.5–5.2)
Sodium: 138 mmol/L (ref 134–144)

## 2017-01-25 LAB — LIPID PANEL
CHOL/HDL RATIO: 5.5 ratio — AB (ref 0.0–5.0)
Cholesterol, Total: 187 mg/dL (ref 100–199)
HDL: 34 mg/dL — ABNORMAL LOW (ref >39)
LDL Calculated: 111 mg/dL — ABNORMAL HIGH (ref 0–99)
Triglycerides: 211 mg/dL — ABNORMAL HIGH (ref 0–149)
VLDL Cholesterol Cal: 42 mg/dL — ABNORMAL HIGH (ref 5–40)

## 2017-01-25 LAB — CBC
Hematocrit: 40.6 % (ref 37.5–51.0)
Hemoglobin: 13.9 g/dL (ref 13.0–17.7)
MCH: 31.9 pg (ref 26.6–33.0)
MCHC: 34.2 g/dL (ref 31.5–35.7)
MCV: 93 fL (ref 79–97)
PLATELETS: 201 10*3/uL (ref 150–379)
RBC: 4.36 x10E6/uL (ref 4.14–5.80)
RDW: 14.5 % (ref 12.3–15.4)
WBC: 7 10*3/uL (ref 3.4–10.8)

## 2017-01-25 LAB — FOLATE: FOLATE: 13.6 ng/mL (ref >3.0)

## 2017-01-25 LAB — RPR: RPR: NONREACTIVE

## 2017-01-25 LAB — VITAMIN D 25 HYDROXY (VIT D DEFICIENCY, FRACTURES): VIT D 25 HYDROXY: 43.6 ng/mL (ref 30.0–100.0)

## 2017-01-25 LAB — VITAMIN B12: Vitamin B-12: 430 pg/mL (ref 232–1245)

## 2017-01-25 LAB — SEDIMENTATION RATE: Sed Rate: 9 mm/hr (ref 0–30)

## 2017-01-31 ENCOUNTER — Ambulatory Visit (INDEPENDENT_AMBULATORY_CARE_PROVIDER_SITE_OTHER): Payer: Medicare Other | Admitting: Family Medicine

## 2017-01-31 ENCOUNTER — Encounter: Payer: Self-pay | Admitting: Family Medicine

## 2017-01-31 VITALS — BP 114/65 | HR 69 | Ht 70.0 in | Wt 185.0 lb

## 2017-01-31 DIAGNOSIS — E7800 Pure hypercholesterolemia, unspecified: Secondary | ICD-10-CM | POA: Diagnosis not present

## 2017-01-31 DIAGNOSIS — R946 Abnormal results of thyroid function studies: Secondary | ICD-10-CM

## 2017-01-31 DIAGNOSIS — R413 Other amnesia: Secondary | ICD-10-CM | POA: Diagnosis not present

## 2017-01-31 DIAGNOSIS — E786 Lipoprotein deficiency: Secondary | ICD-10-CM | POA: Diagnosis not present

## 2017-01-31 DIAGNOSIS — E860 Dehydration: Secondary | ICD-10-CM

## 2017-01-31 DIAGNOSIS — I10 Essential (primary) hypertension: Secondary | ICD-10-CM

## 2017-01-31 DIAGNOSIS — R296 Repeated falls: Secondary | ICD-10-CM

## 2017-01-31 DIAGNOSIS — R5383 Other fatigue: Secondary | ICD-10-CM

## 2017-01-31 DIAGNOSIS — R609 Edema, unspecified: Secondary | ICD-10-CM | POA: Diagnosis not present

## 2017-01-31 DIAGNOSIS — Z8546 Personal history of malignant neoplasm of prostate: Secondary | ICD-10-CM | POA: Diagnosis not present

## 2017-01-31 DIAGNOSIS — R7302 Impaired glucose tolerance (oral): Secondary | ICD-10-CM

## 2017-01-31 DIAGNOSIS — R7989 Other specified abnormal findings of blood chemistry: Secondary | ICD-10-CM

## 2017-01-31 DIAGNOSIS — F341 Dysthymic disorder: Secondary | ICD-10-CM

## 2017-01-31 DIAGNOSIS — E782 Mixed hyperlipidemia: Secondary | ICD-10-CM | POA: Diagnosis not present

## 2017-01-31 DIAGNOSIS — E781 Pure hyperglyceridemia: Secondary | ICD-10-CM | POA: Diagnosis not present

## 2017-01-31 DIAGNOSIS — E559 Vitamin D deficiency, unspecified: Secondary | ICD-10-CM | POA: Diagnosis not present

## 2017-01-31 MED ORDER — FISH OIL 1000 MG PO CAPS: 3.0000 | ORAL_CAPSULE | Freq: Every day | ORAL | Status: DC

## 2017-01-31 NOTE — Assessment & Plan Note (Signed)
Denies any falls recently. - Encouraged to use his cane at all times, or his wheeled walker as needed. - Encouraged to ambulate and work on exercising\walking on a regular basis to improve balance, strength etc.

## 2017-01-31 NOTE — Assessment & Plan Note (Signed)
Could be related to thyroid, overweight further lab results

## 2017-01-31 NOTE — Assessment & Plan Note (Addendum)
>>  ASSESSMENT AND PLAN FOR MIXED HYPERLIPIDEMIA WRITTEN ON 09/26/2022  8:12 AM BY Anayiah Howden, DO  >>ASSESSMENT AND PLAN FOR MIXED HYPERLIPIDEMIA WRITTEN ON 01/31/2017 11:41 AM BY Michaela Broski, DO  Unable and unwilling to take cholesterol medications. - Diet lifestyle modifications discussed with patient and wife, handouts provided   >>ASSESSMENT AND PLAN FOR LOW LEVEL OF HIGH DENSITY LIPOPROTEIN (HDL) WRITTEN ON 01/31/2017 16:10 AM BY Indiah Heyden, DO  Encouraged to get out of his lazy boy at least 10 minutes every hour  - Encouraged to walk to the mailbox and back daily with his cane or walker.  >>ASSESSMENT AND PLAN FOR HYPERTRIGLYCERIDEMIA WRITTEN ON 01/31/2017 11:46 AM BY Edwing Figley, DO  Patient was only taking 1000 mg fish oil daily. - Encouraged wife to obtain 1200 mg tablets and take 3 daily. - Prudent diet discussed - Cutting back on fatty carbs discussed

## 2017-01-31 NOTE — Assessment & Plan Note (Signed)
Encouraged to get out of his lazy boy at least 10 minutes every hour  - Encouraged to walk to the mailbox and back daily with his cane or walker.

## 2017-01-31 NOTE — Assessment & Plan Note (Signed)
Encouraged to drink adequate amounts of water per day - Encouraged to ambulate daily

## 2017-01-31 NOTE — Assessment & Plan Note (Signed)
TSH from 10 months ago.  It was 5.34 and now 6.77 - We'll obtain additional labs - Patient desires treatment and says he thinks that's contributing to his fatigue and tiredness.

## 2017-01-31 NOTE — Assessment & Plan Note (Signed)
BP well-controlled; patient remains asymptomatic. - Continue current medications. - Continue home monitoring - Heart healthy diet discussed - Encouraged daily exercise to a goal of walking 5-10 minutes, slowly working his way up to 30

## 2017-01-31 NOTE — Progress Notes (Signed)
Assessment and plan:  1. Essential hypertension   2. Mixed hyperlipidemia   3. Elevated LDL cholesterol level   4. Hypertriglyceridemia   5. Low level of high density lipoprotein (HDL)   6. Glucose intolerance (impaired glucose tolerance)   7. Elevated TSH   8. Vitamin D deficiency   9. Impaired memory   10. Fatigue, unspecified type   11. H/O prostate cancer   12. Edema, unspecified type   13. Dysthymia   14. Dehydration   15. Frequent falls      Essential hypertension  Mixed hyperlipidemia  Elevated LDL cholesterol level  Hypertriglyceridemia  Low level of high density lipoprotein (HDL)  Glucose intolerance (impaired glucose tolerance)  Elevated TSH - Plan: T4, free, TSH+T4F+T3Free  Vitamin D deficiency  Impaired memory - Plan: T4, free, TSH+T4F+T3Free  Fatigue, unspecified type - Plan: T4, free, TSH+T4F+T3Free  H/O prostate cancer  Edema, unspecified type  Dysthymia  Dehydration  Frequent falls  Glucose intolerance (impaired glucose tolerance) A1c - 5.6 recently- much improved since 3 years ago at 5.9. - I discussed decreasing simple carbs in the diet and to move more - We'll check every 6-12 months   HTN (hypertension) BP well-controlled; patient remains asymptomatic. - Continue current medications. - Continue home monitoring - Heart healthy diet discussed - Encouraged daily exercise to a goal of walking 5-10 minutes, slowly working his way up to 30  Mixed hyperlipidemia Unable and unwilling to take cholesterol medications. - Diet lifestyle modifications discussed with patient and wife, handouts provided   chronic B/L Peripheral Edema Encouraged to drink adequate amounts of water per day - Encouraged to ambulate daily   h/o Depression- not current Mood-stable.   Patient denies sadness, depression or anxiousness  Dehydration Encouraged to drink more water which he  rarely drinks any - Renal function stable and less than 1.0  Vitamin D deficiency Much improved from prior which was at 1410 months ago.  Now at 43.6.  - Continue daily supplementation  Elevated TSH TSH from 10 months ago.  It was 5.34 and now 6.77 - We'll obtain additional labs - Patient desires treatment and says he thinks that's contributing to his fatigue and tiredness.  Fatigue Could be related to thyroid, overweight further lab results  Frequent falls Denies any falls recently. - Encouraged to use his cane at all times, or his wheeled walker as needed. - Encouraged to ambulate and work on exercising\walking on a regular basis to improve balance, strength etc.  H/O prostate cancer Released from Dr. Cy Blamer office- only needs to follow up as needed with urology  - Per patient he is prostate cancer free and was released  Hypertriglyceridemia Patient was only taking 1000 mg fish oil daily. - Encouraged wife to obtain 1200 mg tablets and take 3 daily. - Prudent diet discussed - Cutting back on fatty carbs discussed  Impaired memory - Dementia panel essentially within normal limits except for TSH which may be contributing. - Encouraged to take a elderly multivitamin or B complex vitamin daily - Encouraged to do brain activities daily such as reading, learning under language, learning a new instrument etc., - exercise and eat a diet high in antioxidants and heart healthy  Low level of high density lipoprotein (HDL) Encouraged to get out of his lazy boy at least 10 minutes every hour  - Encouraged to walk to the mailbox and back daily with his cane or walker. Pt was in the office today for 40+  minutes, with over 50% time spent in face to face counseling of patient and wife in regards to his various medical conditions, treatment plans of those medical conditions including medicine management and lifestyle modification, strategies to improve health and well being; and in  coordination of care. SEE ABOVE FOR DETAILS   Meds ordered this encounter  Medications  . DISCONTD: Omega-3 Fatty Acids (FISH OIL) 1000 MG CAPS    Sig: Take 1 capsule by mouth daily.  . Omega-3 Fatty Acids (FISH OIL) 1000 MG CAPS    Sig: Take 3 capsules (3,000 mg total) by mouth daily.    Discontinued Medications   No medications on file    Modified Medications   Modified Medication Previous Medication   OMEGA-3 FATTY ACIDS (FISH OIL) 1000 MG CAPS Omega-3 Fatty Acids (FISH OIL) 1000 MG CAPS      Take 3 capsules (3,000 mg total) by mouth daily.    Take 1 capsule by mouth daily.     Orders Placed This Encounter  Procedures  . T4, free  . TSH+T4F+T3Free     Return in about 4 months (around 06/02/2017) for f/up current medical conditions- moving more, brain activitiesQD,heart healthy diet.  Anticipatory guidance and routine counseling done re: condition, txmnt options and need for follow up. All questions of patient's were answered.   Gross side effects, risk and benefits, and alternatives of medications discussed with patient.  Patient is aware that all medications have potential side effects and we are unable to predict every sideeffect or drug-drug interaction that may occur.  Expresses verbal understanding and consents to current therapy plan and treatment regiment.  Please see AVS handed out to patient at the end of our visit for additional patient instructions/ counseling done pertaining to today's office visit.  Note: This document was prepared using Dragon voice recognition software and may include unintentional dictation errors.   ----------------------------------------------------------------------------------------------------------------------  Subjective:   CC:   Trevor Galloway is a 83 y.o. male who presents to [place] at [facility] today for review and discussion of recent bloodwork that was done.  - Patient is here today complaint by his  wife Trevor Galloway.  1. All recent blood work that we ordered was reviewed with patient today.  Patient was counseled on all abnormalities and we discussed dietary and lifestyle changes that could help those values (also medications when appropriate).  Extensive health counseling performed and all patient's concerns/ questions were addressed.      2.  He does not have any complaints today.  His ROS is grossly negative.  3. Prostate CA:   Dr Risa Grill released him and told him he was cancer-free  4. Eating healthier and wife is trying to help pt lose wt.  he has lost 15 or so pounds in the past year.  Wife has been trying to do this for patient.  5. He has not fallen since last seen.  Wife says he sits in his chair for many hours on and during the day and watches TV.  He does read at times.  He does have his cane and 4 point walker which she uses to walk usually outside of the home.  He is not doing any exercise or very much activity  6. Hyperlipidemia:  Patient has a long history of cholesterol abnormality and has refused to take medicines in the past.  His prior PCP tried to start him on a statin but the patient eats a grapefruit daily and refused to  change his diet.  - He does not exercise or walk daily and he eats a lot of pork and beef more than 4 times per week  7. Hypertension: Denies any dizziness, visual changes, chest pain, shortness of breath, leg swelling.  Tolerating medicines well.  Stable.   Wt Readings from Last 3 Encounters:  01/31/17 185 lb (83.9 kg)  09/26/16 194 lb (88 kg)  03/29/16 202 lb 11.2 oz (91.9 kg)   BP Readings from Last 3 Encounters:  01/31/17 114/65  09/26/16 114/76  03/29/16 (!) 147/81   Pulse Readings from Last 3 Encounters:  01/31/17 69  09/26/16 69  03/29/16 75   BMI Readings from Last 3 Encounters:  01/31/17 26.54 kg/m  09/26/16 27.84 kg/m  03/29/16 29.08 kg/m     Patient Care Team    Relationship Specialty Notifications Start End  Mellody Dance,  DO PCP - General Family Medicine  03/29/16   Rana Snare, MD Consulting Physician Urology  03/30/15    Comment: does not follow regularly with this doc  Sherlynn Stalls, MD Consulting Physician Ophthalmology  03/30/15    Comment: does not follow regularly with this doc  Druscilla Brownie, MD Consulting Physician Dermatology  01/01/16   Dorna Leitz, MD Consulting Physician Orthopedic Surgery  09/26/16   Tat, Eustace Quail, [place] Physician Neurology  09/26/16    Comment: does not follow regularly with this doc  Thompson Grayer, MD Consulting Physician Cardiology  09/26/16    Comment: does not follow regularly with this doc    Full medical history updated and reviewed in the office today  Patient Active Problem List   Diagnosis Date Noted  . History of cardiac radiofrequency ablation 09/26/2016    Priority: High  . Diastolic dysfunction- echo shows L grade I diastolic ventricular dysfunction 01/01/2016    Priority: High  . HTN (hypertension) 11/28/2012    Priority: High  . Mixed hyperlipidemia 07/05/2010    Priority: High  . Chronic venous stasis dermatitis of both lower extremities 01/01/2016    Priority: Medium  . Glucose intolerance (impaired glucose tolerance) 01/01/2016    Priority: Medium  . Chronic fatigue and malaise 04/09/2013    Priority: Medium  . h/o Depression- not current 11/28/2012    Priority: Medium  . chronic B/L Peripheral Edema 11/13/2012    Priority: Medium  . Vitamin D deficiency 04/19/2016    Priority: Low  . h/o Shuffling gait due to OA knees/body 09/16/2013    Priority: Low  . Dehydration 05/18/2012    Priority: Low  . Elevated TSH 01/31/2017  . Fatigue 01/31/2017  . Hypertriglyceridemia 01/31/2017  . Low level of high density lipoprotein (HDL) 01/31/2017  . Elevated LDL cholesterol level 01/31/2017  . Impaired memory 09/26/2016  . Vision problems 01/01/2016  . LVH (left ventricular hypertrophy) 01/01/2016  . H/O prostate cancer 10/04/2015  .  Frequent falls 06/17/2013  . Allergic rhinitis 08/15/2012  . General medical examination 01/03/2011  . HEMATOCHEZIA 07/05/2010  . RIGHT BUNDLE BRANCH BLOCK 12/25/2009  . TACHYCARDIA 12/25/2009  . Atrial flutter ([place]) 12/24/2009    Past Medical History:  Diagnosis Date  . Atrial flutter ([place])    s/p CTI ablation 8/11  . Cancer [facility])    Prostate cancer  . Family hx colonic polyps   . H/O prostate cancer 10/04/2015  . Hiatal hernia   . Hyperlipidemia   . Hypertension   . RBBB (right bundle branch block)     Past Surgical History:  Procedure Laterality Date  .  EYE SURGERY     bilateral cataracts  . HERNIA REPAIR    . PROSTATE SURGERY     seed implant    Social History  Substance Use Topics  . Smoking status: Former Smoker    Quit date: 09/23/1965  . Smokeless tobacco: Never Used  . Alcohol use No     Comment: none now    Family Hx: Family History  Problem Relation Age of Onset  . COPD Father   . Parkinsonism Sister   . Parkinsonism Brother   . Cancer Brother        prostate cancer     Medications: Current Outpatient Prescriptions  Medication Sig Dispense Refill  . aspirin 81 MG tablet Take 81 mg by mouth daily.    . Cholecalciferol (VITAMIN D3) 5000 units CAPS Take 1 capsule by mouth daily.     . hydrochlorothiazide (HYDRODIURIL) 12.5 MG tablet take 1 tablet by mouth once daily 90 tablet 1  . metoprolol succinate (TOPROL-XL) 50 MG 24 hr tablet take 1 tablet by mouth once daily **TAKE WITH OR IMMEDIATELY FOLLOWING A MEAL 90 tablet 1  . Multiple Vitamins-Minerals (PRESERVISION AREDS 2) CAPS Take 1 capsule by mouth 2 (two) times daily.    . Omega-3 Fatty Acids (FISH OIL) 1000 MG CAPS Take 3 capsules (3,000 mg total) by mouth daily.     No current facility-administered medications for this visit.     Allergies:  No Known Allergies   Review of Systems: General:   No F/C, wt loss- no unintentional Pulm:   No DIB, SOB, pleuritic chest pain Card:  No CP,  palpitations Abd:  No n/v/d or pain Ext:  No inc edema from baseline  Objective:  Blood pressure 114/65, pulse 69, height 5\' 10"  (1.778 m), weight 185 lb (83.9 kg). Body mass index is 26.54 kg/m. Gen:   Well NAD, A and O *3 HEENT:    [place]/AT, EOMI,  MMM Lungs:   Normal work of breathing. ECTA B/L, no Wh, rhonchi Heart:  Distant, RRR, S1, S2 Abd:   No gross distention Exts:    warm, pink,  Brisk capillary refill, warm and well perfused.  pitting edema lower extremities-equal bilateral; chronic venous stasis changes Psych:    No HI/SI, judgement and insight good, Euthymic mood. Full Affect.   Recent Results (from the past 2160 hour(s))  Lipid panel     Status: Abnormal   Collection Time: 01/24/17  9:33 AM  Result Value Ref Range   Cholesterol, Total 187 100 - 199 mg/dL   Triglycerides 211 (H) 0 - 149 mg/dL   HDL 34 (L) >39 mg/dL   VLDL Cholesterol Cal 42 (H) 5 - 40 mg/dL   LDL Calculated 111 (H) 0 - 99 mg/dL   Chol/HDL Ratio 5.5 (H) 0.0 - 5.0 ratio    Comment:                                   T. Chol/HDL Ratio                                             Men  Women  1/2 Avg.Risk  3.4    3.3                                   Avg.Risk  5.0    4.4                                2X Avg.Risk  9.6    7.1                                3X Avg.Risk 23.4   11.0   VITAMIN D 25 Hydroxy (Vit-D Deficiency, Fractures)     Status: None   Collection Time: 01/24/17  9:33 AM  Result Value Ref Range   Vit D, 25-Hydroxy 43.6 30.0 - 100.0 ng/mL    Comment: Vitamin D deficiency has been defined by the [place] practice guideline as a level of serum 25-OH vitamin D less than 20 ng/mL (1,2). The Endocrine Society went on to further define vitamin D insufficiency as a level between 21 and 29 ng/mL (2). 1. IOM (Institute of Medicine). 2010. Dietary reference    intakes for calcium and D. [place]: The    [employer]. 2. Holick MF, Binkley Newsoms, Bischoff-Ferrari HA, et al.    Evaluation, treatment, and prevention of vitamin D    deficiency: an Endocrine Society clinical practice    guideline. JCEM. 2011 Jul; 96(7):1911-30.   Hemoglobin A1c     Status: None   Collection Time: 01/24/17  9:33 AM  Result Value Ref Range   Hgb A1c MFr Bld 5.6 4.8 - 5.6 %    Comment:          Prediabetes: 5.7 - 6.4          Diabetes: >6.4          Glycemic control for adults with diabetes: <7.0    Est. average glucose Bld gHb Est-mCnc 114 mg/dL  RPR     Status: None   Collection Time: 01/24/17  9:33 AM  Result Value Ref Range   RPR Ser Ql Non Reactive Non Reactive  Vitamin B12     Status: None   Collection Time: 01/24/17  9:33 AM  Result Value Ref Range   Vitamin B-12 430 232 - 1,245 pg/mL  Sedimentation rate     Status: None   Collection Time: 01/24/17  9:33 AM  Result Value Ref Range   Sed Rate 9 0 - 30 mm/hr  CBC     Status: None   Collection Time: 01/24/17  9:33 AM  Result Value Ref Range   WBC 7.0 3.4 - 10.8 x10E3/uL   RBC 4.36 4.14 - 5.80 x10E6/uL   Hemoglobin 13.9 13.0 - 17.7 g/dL   Hematocrit 40.6 37.5 - 51.0 %   MCV 93 79 - 97 fL   MCH 31.9 26.6 - 33.0 pg   MCHC 34.2 31.5 - 35.7 g/dL   RDW 14.5 12.3 - 15.4 %   Platelets 201 150 - 379 x10E3/uL  TSH     Status: Abnormal   Collection Time: 01/24/17  9:33 AM  Result Value Ref Range   TSH 6.770 (H) 0.450 - 4.500 uIU/mL  Folate     Status: None   Collection Time: 01/24/17  9:33 AM  Result Value Ref Range   Folate 13.6 >3.0 ng/mL    Comment: A serum folate concentration of less than 3.1 ng/mL is considered to represent clinical deficiency.   Basic metabolic panel     Status: None   Collection Time: 01/24/17  9:33 AM  Result Value Ref Range   Glucose 96 65 - 99 mg/dL   BUN 16 8 - 27 mg/dL   Creatinine, Ser 0.90 0.76 - 1.27 mg/dL   GFR calc non Af Amer 76 >59 mL/min/1.73   GFR calc Af Amer 88 >59 mL/min/1.73   BUN/Creatinine Ratio 18 10 -  24   Sodium 138 134 - 144 mmol/L   Potassium 4.1 3.5 - 5.2 mmol/L   Chloride 102 96 - 106 mmol/L   CO2 22 20 - 29 mmol/L   Calcium 9.6 8.6 - 10.2 mg/dL

## 2017-01-31 NOTE — Assessment & Plan Note (Signed)
-   Dementia panel essentially within normal limits except for TSH which may be contributing. - Encouraged to take a elderly multivitamin or B complex vitamin daily - Encouraged to do brain activities daily such as reading, learning under language, learning a new instrument etc., - exercise and eat a diet high in antioxidants and heart healthy

## 2017-01-31 NOTE — Assessment & Plan Note (Signed)
Much improved from prior which was at 1410 months ago.  Now at 43.6.  - Continue daily supplementation

## 2017-01-31 NOTE — Assessment & Plan Note (Addendum)
A1c - 5.6 recently- much improved since 3 years ago at 5.9. - I discussed decreasing simple carbs in the diet and to move more - We'll check every 6-12 months

## 2017-01-31 NOTE — Assessment & Plan Note (Signed)
>>  ASSESSMENT AND PLAN FOR FATIGUE WRITTEN ON 01/31/2017 11:44 AM BY OPALSKI, DEBORAH, DO  Could be related to thyroid, overweight further lab results

## 2017-01-31 NOTE — Assessment & Plan Note (Addendum)
Patient was only taking 1000 mg fish oil daily. - Encouraged wife to obtain 1200 mg tablets and take 3 daily. - Prudent diet discussed - Cutting back on fatty carbs discussed

## 2017-01-31 NOTE — Assessment & Plan Note (Signed)
Mood-stable.   Patient denies sadness, depression or anxiousness

## 2017-01-31 NOTE — Assessment & Plan Note (Signed)
Encouraged to drink more water which he rarely drinks any - Renal function stable and less than 1.0

## 2017-01-31 NOTE — Patient Instructions (Addendum)
Fish oil should be increased to 3000-4000 daily  4 your cholesterol:  Please work on a low saturated and Transfats diet as well as decrease the fatty carbohydrates that you eat.  It is important that you try to move more as well.  Please try to get up on a chair for 10 minutes every hour at a minimum.  Also using a 4 point walker while you walk down to the mailbox and back would be very helpful.  You should do this daily.  Work your way up to it slowly.  Please seen example of a heart healthy diet below    Fat and Cholesterol Restricted Diet  High levels of fat and cholesterol in your blood may lead to various health problems, such as diseases of the heart, blood vessels, gallbladder, liver, and pancreas. Fats are concentrated sources of energy that come in various forms. Certain types of fat, including saturated fat, may be harmful in excess. Cholesterol is a substance needed by your body in small amounts. Your body makes all the cholesterol it needs. Excess cholesterol comes from the food you eat. When you have high levels of cholesterol and saturated fat in your blood, health problems can develop because the excess fat and cholesterol will gather along the walls of your blood vessels, causing them to narrow. Choosing the right foods will help you control your intake of fat and cholesterol. This will help keep the levels of these substances in your blood within normal limits and reduce your risk of disease.  What types of fat should I choose?  Choose healthy fats more often. Choose monounsaturated and polyunsaturated fats, such as olive and canola oil, flaxseeds, walnuts, almonds, and seeds.  Eat more omega-3 fats. Good choices include salmon, mackerel, sardines, tuna, flaxseed oil, and ground flaxseeds. Aim to eat fish at least two times a week.  Limit saturated fats. Saturated fats are primarily found in animal products, such as meats, butter, and cream. Plant sources of saturated fats include  palm oil, palm kernel oil, and coconut oil.  Avoid foods with partially hydrogenated oils in them. These contain trans fats. Examples of foods that contain trans fats are stick margarine, some tub margarines, cookies, crackers, and other baked goods.  What general guidelines do I need to follow? These guidelines for healthy eating will help you control your intake of fat and cholesterol:  Check food labels carefully to identify foods with trans fats or high amounts of saturated fat.  Fill one half of your plate with vegetables and green salads.  Fill one fourth of your plate with whole grains. Look for the word "whole" as the first word in the ingredient list.  Fill one fourth of your plate with lean protein foods.  Limit fruit to two servings a day. Choose fruit instead of juice.  Eat more foods that contain fiber, such as apples, broccoli, carrots, beans, peas, and barley.  Eat more home-cooked food and less restaurant, buffet, and fast food.  Limit or avoid alcohol.  Limit foods high in starch and sugar.  Limit fried foods.  Cook foods using methods other than frying. Baking, boiling, grilling, and broiling are all great options.  Lose weight if you are overweight. Losing just 5-10% of your initial body weight can help your overall health and prevent diseases such as diabetes and heart disease.  What foods can I eat? Grains  Whole grains, such as whole wheat or whole grain breads, crackers, cereals, and pasta. Unsweetened oatmeal, bulgur,  barley, quinoa, or brown rice. Corn or whole wheat flour tortillas. Vegetables  Fresh or frozen vegetables (raw, steamed, roasted, or grilled). Green salads. Fruits  All fresh, canned (in natural juice), or frozen fruits. Meats and other protein foods  Ground beef (85% or leaner), grass-fed beef, or beef trimmed of fat. Skinless chicken or [place]. Ground chicken or [place]. Pork trimmed of fat. All fish and seafood. Eggs. Dried beans,  peas, or lentils. Unsalted nuts or seeds. Unsalted canned or dry beans. Dairy  Low-fat dairy products, such as skim or 1% milk, 2% or reduced-fat cheeses, low-fat ricotta or cottage cheese, or plain low-fat yo Fats and oils  Tub margarines without trans fats. Light or reduced-fat mayonnaise and salad dressings. Avocado. Olive, canola, sesame, or safflower oils. Natural peanut or almond butter (choose ones without added sugar and oil). The items listed above may not be a complete list of recommended foods or beverages. Contact your dietitian for more options. Foods to avoid Grains  White bread. White pasta. White rice. Cornbread. Bagels, pastries, and croissants. Crackers that contain trans fat. Vegetables  White potatoes. Corn. Creamed or fried vegetables. Vegetables in a cheese sauce. Fruits  Dried fruits. Canned fruit in light or heavy syrup. Fruit juice. Meats and other protein foods  Fatty cuts of meat. Ribs, chicken wings, bacon, sausage, bologna, salami, chitterlings, fatback, hot dogs, bratwurst, and packaged luncheon meats. Liver and organ meats. Dairy  Whole or 2% milk, cream, half-and-half, and cream cheese. Whole milk cheeses. Whole-fat or sweetened yogurt. Full-fat cheeses. Nondairy creamers and whipped toppings. Processed cheese, cheese spreads, or cheese curds. Beverages  Alcohol. Sweetened drinks (such as sodas, lemonade, and fruit drinks or punches). Fats and oils  Butter, stick margarine, lard, shortening, ghee, or bacon fat. Coconut, palm kernel, or palm oils. Sweets and desserts  Corn syrup, sugars, honey, and molasses. Candy. Jam and jelly. Syrup. Sweetened cereals. Cookies, pies, cakes, donuts, muffins, and ice cream. The items listed above may not be a complete list of foods and beverages to avoid. Contact your dietitian for more information. This information is not intended to replace advice given to you by your health care provider. Make sure you discuss any  questions you have with your health care provider. Document Released: 05/02/2005 Document Revised: 05/23/2014 Document Reviewed: 07/31/2013 Elsevier Interactive Patient Education  2017 [employer].

## 2017-01-31 NOTE — Assessment & Plan Note (Signed)
>>  ASSESSMENT AND PLAN FOR FREQUENT FALLS WRITTEN ON 01/31/2017 11:45 AM BY OPALSKI, DEBORAH, DO  Denies any falls recently. - Encouraged to use his cane at all times, or his wheeled walker as needed. - Encouraged to ambulate and work on exercising\walking on a regular basis to improve balance, strength etc.

## 2017-01-31 NOTE — Assessment & Plan Note (Signed)
Released from Dr. Cy Blamer office- only needs to follow up as needed with urology  - Per patient he is prostate cancer free and was released

## 2017-02-01 LAB — TSH+T4F+T3FREE
Free T4: 1.11 ng/dL (ref 0.82–1.77)
T3, Free: 2.5 pg/mL (ref 2.0–4.4)
TSH: 4.83 u[IU]/mL — ABNORMAL HIGH (ref 0.450–4.500)

## 2017-02-22 DIAGNOSIS — Z23 Encounter for immunization: Secondary | ICD-10-CM | POA: Diagnosis not present

## 2017-04-25 ENCOUNTER — Other Ambulatory Visit: Payer: Self-pay | Admitting: Family Medicine

## 2017-05-11 ENCOUNTER — Telehealth: Payer: Self-pay | Admitting: Family Medicine

## 2017-05-11 NOTE — Telephone Encounter (Signed)
Patient's wife called saying his handicap placard is going to expire in February and needs this renewed. This would be the first time we have done this for him (Dr. Birdie Riddle his old PCP did it last time). If you have any questions she can be reached at the home number

## 2017-05-11 NOTE — Telephone Encounter (Signed)
Does pt need OV for evaluation to complete this form?  Charyl Bigger, CMA

## 2017-05-11 NOTE — Telephone Encounter (Signed)
No does not need office visit,   I will just fill out form and he can pay what ever charges associated with form to fill out through [facility]

## 2017-05-12 NOTE — Telephone Encounter (Signed)
Form completed and placed in Dr. Hershal Coria inbox for signature along with charge sheet.  Charyl Bigger, CMA

## 2017-06-12 ENCOUNTER — Other Ambulatory Visit: Payer: Self-pay | Admitting: Family Medicine

## 2017-06-12 DIAGNOSIS — I10 Essential (primary) hypertension: Secondary | ICD-10-CM

## 2017-06-12 DIAGNOSIS — R000 Tachycardia, unspecified: Secondary | ICD-10-CM

## 2017-06-15 ENCOUNTER — Telehealth: Payer: Self-pay | Admitting: Family Medicine

## 2017-06-15 NOTE — Telephone Encounter (Signed)
Noted MPulliam, CMA/RT(R)  

## 2017-06-15 NOTE — Telephone Encounter (Signed)
Provider required OV --set pt for 2/ 25/19 @ 10:30 for appointment. --glh

## 2017-06-16 ENCOUNTER — Telehealth: Payer: Self-pay | Admitting: Family Medicine

## 2017-06-16 NOTE — Telephone Encounter (Signed)
Per Dorothea Ogle - urology not neurology. MPulliam, CMA/RT(R)

## 2017-06-16 NOTE — Telephone Encounter (Signed)
Patient's daughter Arbie Cookey called on behalf of patient and was told that Neuro doesn't;t need to follow him anymore unless any new problems arise. The only thing they are concerned about is the routine PSA lab draw that he was getting done by neuro and was wondering if that was something they could just continue to get here at our clinic instead of going all the way into [place] for a simple lab draw (this was neuro's recommendation). I told the daughter that Dr. Jenetta Downer has never obtained a PSA on him but I would forward a message to the clinic staff to see if this is something we can start doing in the future. Please advise. Daughter can be reached at [phone].

## 2017-06-16 NOTE — Telephone Encounter (Signed)
Spoke to patient's wife - notified that we can do the lab we just need notes telling us how often and for what reason. MPulliam, CMA/RT(R)

## 2017-06-16 NOTE — Telephone Encounter (Signed)
Pt's Daughter/ Nelva Nay called back after spking w/ Dorothea Ogle to request if PSA results could be send to [facility] went  (Lab test ) perform at our office. --glh

## 2017-06-23 DIAGNOSIS — C61 Malignant neoplasm of prostate: Secondary | ICD-10-CM | POA: Diagnosis not present

## 2017-07-10 ENCOUNTER — Ambulatory Visit (INDEPENDENT_AMBULATORY_CARE_PROVIDER_SITE_OTHER): Payer: Medicare Other | Admitting: Family Medicine

## 2017-07-10 ENCOUNTER — Encounter: Payer: Self-pay | Admitting: Family Medicine

## 2017-07-10 VITALS — BP 136/79 | HR 73 | Ht 70.0 in | Wt 189.9 lb

## 2017-07-10 DIAGNOSIS — R609 Edema, unspecified: Secondary | ICD-10-CM | POA: Diagnosis not present

## 2017-07-10 DIAGNOSIS — C801 Malignant (primary) neoplasm, unspecified: Secondary | ICD-10-CM | POA: Diagnosis not present

## 2017-07-10 DIAGNOSIS — R5381 Other malaise: Secondary | ICD-10-CM | POA: Diagnosis not present

## 2017-07-10 DIAGNOSIS — R5382 Chronic fatigue, unspecified: Secondary | ICD-10-CM

## 2017-07-10 DIAGNOSIS — Z9889 Other specified postprocedural states: Secondary | ICD-10-CM

## 2017-07-10 DIAGNOSIS — I484 Atypical atrial flutter: Secondary | ICD-10-CM | POA: Diagnosis not present

## 2017-07-10 DIAGNOSIS — E559 Vitamin D deficiency, unspecified: Secondary | ICD-10-CM | POA: Diagnosis not present

## 2017-07-10 DIAGNOSIS — R7989 Other specified abnormal findings of blood chemistry: Secondary | ICD-10-CM

## 2017-07-10 DIAGNOSIS — E782 Mixed hyperlipidemia: Secondary | ICD-10-CM

## 2017-07-10 DIAGNOSIS — I10 Essential (primary) hypertension: Secondary | ICD-10-CM | POA: Diagnosis not present

## 2017-07-10 NOTE — Patient Instructions (Signed)
Please try to get up and walk around a little more.   At least 5 minutes every hour you should get up out of your chair and move.   Also when you are sitting for long periods try to get your feet up and elevated above the level of your heart.

## 2017-07-10 NOTE — Progress Notes (Signed)
Impression and Recommendations:    1. Essential hypertension   2. Mixed hyperlipidemia   3. History of cardiac radiofrequency ablation   4. Chronic fatigue and malaise   5. Vitamin D deficiency   6. Cancer (HCC)   7. Elevated TSH   8. Edema, unspecified type   9. Atypical atrial flutter (HCC) Chronic    1. Essential HTN: BP is well-controlled at home. Sx stable at this time. Pt is asymptomatic. Continue meds as listed below.   2. Mixed HLD- cholesterol obtained Sept 2018. Will recheck in 6 months.  3. H/o cardiac radiofrequency ablation- pt does not have a current cardiologist, Dr. Rayann Heman, and has not seen them since 2011. He only sees them PRN. Still remains asymptomatic and in normal rhythm.   4. Chronic fatigue and malaise-feel this is major contributor to pt PHQ. Denies depression. -Despite his PHQQ score of 12, I think it is not a matter of depression, but pt feels he is older and it is difficult to move around due to his age. -stressed the importance of walking 5 minutes every hour and moving around the house more, as well as doing daily brain activities like learning a new language, etc.  -discussed listening to audio books.  5. Vitamin D deficiency- continue supplements as listed below.  6. CA- h/o prostate cancer. Goes to urology yearly. Recent PSA WNL.  7. Elevated TSH  Consistent with subclinical hypothyroidism. T4 T3 stable, will check yearly.  8. Bilateral lower extremity edema- encouraged pt to get up and move more using a wheeled walker on level ground. Elevate your legs above your heart when possible.   Confirmed by wife that they do not feel he is depressed. Pt declined medications at this time  9. Atypical atrial flutter- Due to risk of irregular heart beat, h/o cancer, etc. continue taking baby aspirin as listed below.   Gross side effects, risk and benefits, and alternatives of medications and treatment plan in general discussed with patient.  Patient is  aware that all medications have potential side effects and we are unable to predict every side effect or drug-drug interaction that may occur.   Patient will call with any questions prior to using medication if they have concerns.  Expresses verbal understanding and consents to current therapy and treatment regimen.  No barriers to understanding were identified.  Red flag symptoms and signs discussed in detail.  Patient expressed understanding regarding what to do in case of emergency\urgent symptoms  Please see AVS handed out to patient at the end of our visit for further patient instructions/ counseling done pertaining to today's office visit.   Return in about 4 months (around 11/07/2017) for bp, chronic issues.    Note: This note was prepared with assistance of Dragon voice recognition software. Occasional wrong-word or sound-a-like substitutions may have occurred due to the inherent limitations of voice recognition software.  This document serves as a record of services personally performed by Mellody Dance, DO. It was created on her behalf by Mayer Masker, a trained medical scribe. The creation of this record is based on the scribe's personal observations and the provider's statements to them.   drug seeking behavior  -------------------------------------------------------------------------------------------------------------------------------------------------------------------------------------------------------------------------------------------- Subjective:     HPI: Trevor Galloway is a 84 y.o. male who presents to [place] at [facility] today for issues as discussed below. He has been working a little bit, but not much on his goals of exercising more, using brain activities, and drinking  more water.   He had a recent PSA blood test with results WNL.   Exercise: he has not been exercising regularly, other than going to the bathroom on occasion. He is not using his  walker.   Thyroid: He does not feel any different.  Vision:  Pt states he has difficulty reading things. He went to an optometrist and they have done everything they can to help his vision. He has not listened to audio books before, but he does listen to the TV, especially the news.   HTN HPI:  -  His blood pressure has been controlled at home.   - Patient reports good compliance with blood pressure medications  - Denies medication S-E.  Pt has not been seeing a cardiologist. Last OV with Dr. Rayann Heman was in 2011, who does not need to follow up with him anymore and only PRN.  He has not been taking aspirin for 1 month and was curious if he should continue this.   - Smoking Status noted   - He denies new onset of: chest pain, exercise intolerance, shortness of breath, dizziness, visual changes, headache, lower extremity swelling or claudication.   Last 3 blood pressure readings in our office are as follows: BP Readings from Last 3 Encounters:  07/10/17 136/79  01/31/17 114/65  09/26/16 114/76    Filed Weights   07/10/17 1042  Weight: 189 lb 14.4 oz (86.1 kg)    Wt Readings from Last 3 Encounters:  07/10/17 189 lb 14.4 oz (86.1 kg)  01/31/17 185 lb (83.9 kg)  09/26/16 194 lb (88 kg)   BP Readings from Last 3 Encounters:  07/10/17 136/79  01/31/17 114/65  09/26/16 114/76   Pulse Readings from Last 3 Encounters:  07/10/17 73  01/31/17 69  09/26/16 69   BMI Readings from Last 3 Encounters:  07/10/17 27.25 kg/m  01/31/17 26.54 kg/m  09/26/16 27.84 kg/m   Mood: He does not think he is depressed. Also his wife denies depressed mood. Per pt, he enjoys sitting in his chair. Per his wife, he was once active and doing yard work chores etc. But since 4 years ago when he had a fall and injured his shoulder, he states he feels too old to go out and do things. He denies SI/HI. He states he wants to live a little longer. He also notes he is the only one living left in his  family.  Depression screen [facility] 2/9 07/10/2017 09/26/2016 03/30/2015 04/30/2014 04/09/2013  Decreased Interest 3 0 0 0 1  Down, Depressed, Hopeless 0 0 0 0 1  PHQ - 2 Score 3 0 0 0 2  Altered sleeping 0 0 - - 0  Tired, decreased energy 3 3 - - 1  Change in appetite 0 0 - - 0  Feeling bad or failure about yourself  0 0 - - 0  Trouble concentrating 3 0 - - 0  Moving slowly or fidgety/restless 3 0 - - 1  Suicidal thoughts 0 0 - - 0  PHQ-9 Score 12 3 - - 4    Patient Care Team    Relationship Specialty Notifications Start End  Mellody Dance, DO PCP - General Family Medicine  03/29/16   Rana Snare, MD Consulting Physician Urology  03/30/15    Comment: does not follow regularly with this doc  Sherlynn Stalls, MD Consulting Physician Ophthalmology  03/30/15    Comment: does not follow regularly with this doc  Druscilla Brownie, MD Consulting Physician Dermatology  01/01/16   Dorna Leitz, MD Consulting Physician Orthopedic Surgery  09/26/16   Tat, Eustace Quail, DO Consulting Physician Neurology  09/26/16    Comment: does not follow regularly with this doc  Thompson Grayer, MD Consulting Physician Cardiology  09/26/16    Comment: does not follow regularly with this doc  Ceasar Mons, MD Consulting Physician Urology  07/10/17      Patient Active Problem List   Diagnosis Date Noted  . History of cardiac radiofrequency ablation 09/26/2016    Priority: High  . Diastolic dysfunction- echo shows L grade I diastolic ventricular dysfunction 01/01/2016    Priority: High  . HTN (hypertension) 11/28/2012    Priority: High  . Mixed hyperlipidemia 07/05/2010    Priority: High  . Chronic venous stasis dermatitis of both lower extremities 01/01/2016    Priority: Medium  . Glucose intolerance (impaired glucose tolerance) 01/01/2016    Priority: Medium  . Chronic fatigue and malaise 04/09/2013    Priority: Medium  . h/o Depression- not current 11/28/2012    Priority: Medium  . chronic  B/L Peripheral Edema 11/13/2012    Priority: Medium  . Vitamin D deficiency 04/19/2016    Priority: Low  . h/o Shuffling gait due to OA knees/body 09/16/2013    Priority: Low  . Dehydration 05/18/2012    Priority: Low  . Cancer ([place]) 07/10/2017  . Elevated TSH 01/31/2017  . Fatigue 01/31/2017  . Hypertriglyceridemia 01/31/2017  . Low level of high density lipoprotein (HDL) 01/31/2017  . Elevated LDL cholesterol level 01/31/2017  . Impaired memory 09/26/2016  . Vision problems 01/01/2016  . LVH (left ventricular hypertrophy) 01/01/2016  . H/O prostate cancer 10/04/2015  . Frequent falls 06/17/2013  . Allergic rhinitis 08/15/2012  . General medical examination 01/03/2011  . HEMATOCHEZIA 07/05/2010  . RIGHT BUNDLE BRANCH BLOCK 12/25/2009  . TACHYCARDIA 12/25/2009  . Atrial flutter ([place]) 12/24/2009    Past Medical history, Surgical history, Family history, Social history, Allergies and Medications have been entered into the medical record, reviewed and changed as needed.    Current Meds  Medication Sig  . Ascorbic Acid (VITAMIN C) 1000 MG tablet Take 1,000 mg by mouth daily.  . Cholecalciferol (VITAMIN D3) 5000 units CAPS Take 1 capsule by mouth daily.   . hydrochlorothiazide (HYDRODIURIL) 12.5 MG tablet take 1 tablet by mouth once daily  . metoprolol succinate (TOPROL-XL) 50 MG 24 hr tablet take 1 tablet by mouth once daily (TAKE WITH OR IMMEDIATELY FOLLOWING A MEAL)  . Multiple Vitamins-Minerals (PRESERVISION AREDS 2) CAPS Take 1 capsule by mouth 2 (two) times daily.  . Omega-3 Fatty Acids (FISH OIL) 1000 MG CAPS Take 3 capsules (3,000 mg total) by mouth daily.    Allergies:  No Known Allergies   Review of Systems:  A fourteen system review of systems was performed and found to be positive as per HPI.   Objective:   Blood pressure 136/79, pulse 73, height 5\' 10"  (1.778 m), weight 189 lb 14.4 oz (86.1 kg), SpO2 95 %. Body mass index is 27.25 kg/m. General:  Well  Developed, well nourished, appropriate for stated age.  Neuro:  Alert and oriented,  extra-ocular muscles intact  HEENT:  Normocephalic, atraumatic, neck supple, no carotid bruits appreciated  Skin:  no gross rash, warm, pink. 2+ nonpitting edema bilaterally Cardiac:  RRR, S1 S2 Respiratory:  ECTA B/L and A/P, Not using accessory muscles, speaking in full sentences- unlabored. Vascular:  Ext warm, no cyanosis apprec.; cap RF less 2  sec. Psych:  No HI/SI, judgement and insight good, Euthymic mood. Full Affect.

## 2017-09-08 DIAGNOSIS — C61 Malignant neoplasm of prostate: Secondary | ICD-10-CM | POA: Diagnosis not present

## 2017-10-30 ENCOUNTER — Other Ambulatory Visit: Payer: Self-pay

## 2017-10-30 MED ORDER — HYDROCHLOROTHIAZIDE 12.5 MG PO TABS: 12.5000 mg | ORAL_TABLET | Freq: Every day | ORAL | 1 refills | Status: DC

## 2017-10-30 NOTE — Telephone Encounter (Signed)
Refill request HCTZ.  Reviewed chart and sen refill into the pharmacy. MPulliam, CMA/RT(R)

## 2017-11-07 ENCOUNTER — Ambulatory Visit (INDEPENDENT_AMBULATORY_CARE_PROVIDER_SITE_OTHER): Payer: Medicare Other | Admitting: Family Medicine

## 2017-11-07 ENCOUNTER — Encounter: Payer: Self-pay | Admitting: Family Medicine

## 2017-11-07 VITALS — BP 126/82 | HR 63 | Ht 70.0 in | Wt 186.6 lb

## 2017-11-07 DIAGNOSIS — E786 Lipoprotein deficiency: Secondary | ICD-10-CM

## 2017-11-07 DIAGNOSIS — R609 Edema, unspecified: Secondary | ICD-10-CM

## 2017-11-07 DIAGNOSIS — E559 Vitamin D deficiency, unspecified: Secondary | ICD-10-CM | POA: Diagnosis not present

## 2017-11-07 DIAGNOSIS — I484 Atypical atrial flutter: Secondary | ICD-10-CM

## 2017-11-07 DIAGNOSIS — R7302 Impaired glucose tolerance (oral): Secondary | ICD-10-CM | POA: Diagnosis not present

## 2017-11-07 DIAGNOSIS — Z9889 Other specified postprocedural states: Secondary | ICD-10-CM | POA: Diagnosis not present

## 2017-11-07 DIAGNOSIS — E782 Mixed hyperlipidemia: Secondary | ICD-10-CM | POA: Diagnosis not present

## 2017-11-07 DIAGNOSIS — R7989 Other specified abnormal findings of blood chemistry: Secondary | ICD-10-CM

## 2017-11-07 DIAGNOSIS — F341 Dysthymic disorder: Secondary | ICD-10-CM | POA: Diagnosis not present

## 2017-11-07 DIAGNOSIS — R5382 Chronic fatigue, unspecified: Secondary | ICD-10-CM | POA: Diagnosis not present

## 2017-11-07 DIAGNOSIS — C801 Malignant (primary) neoplasm, unspecified: Secondary | ICD-10-CM

## 2017-11-07 DIAGNOSIS — I5189 Other ill-defined heart diseases: Secondary | ICD-10-CM | POA: Diagnosis not present

## 2017-11-07 DIAGNOSIS — I10 Essential (primary) hypertension: Secondary | ICD-10-CM

## 2017-11-07 DIAGNOSIS — R5383 Other fatigue: Secondary | ICD-10-CM

## 2017-11-07 DIAGNOSIS — R5381 Other malaise: Secondary | ICD-10-CM | POA: Diagnosis not present

## 2017-11-07 DIAGNOSIS — R000 Tachycardia, unspecified: Secondary | ICD-10-CM

## 2017-11-07 DIAGNOSIS — E781 Pure hyperglyceridemia: Secondary | ICD-10-CM

## 2017-11-07 NOTE — Progress Notes (Signed)
Impression and Recommendations:    1. Essential hypertension   2. Mixed hyperlipidemia   3. History of cardiac radiofrequency ablation   4. Dysthymia   5. Chronic fatigue and malaise   6. Vitamin D deficiency   7. Cancer ([place])   8. Elevated TSH   9. Atypical atrial flutter (HCC)   10. Diastolic dysfunction- echo shows L grade I diastolic ventricular dysfunction   11. Glucose intolerance (impaired glucose tolerance)   12. Edema, unspecified type   13. Low level of high density lipoprotein (HDL)   14. Hypertriglyceridemia   15. Fatigue, unspecified type   16. h/o Tachycardia     1. Essential Hypertension - Patient's blood pressure continues to be well-controlled at home. - Symptoms stable at this time; patient asymptomatic.  - Continue treatment and medications as listed below.   2. Mixed Hyperlipemia - Last cholesterol measurement obtained in September 2018. - Patient is due for re-check today.  3. H/o Cardiac Radiofrequency Ablation - Patient remains asymptomatic and in normal rhythm.  - Does not have a current cardiologist.  Previously saw Dr. Rayann Heman, and per last appointment, has not seen them since 2011.  Only visits cards PRN.   - Patient may continue to follow-up here for cardiac health if he remains asymptomatic.  - Patient and wife declined echocardiogram today, or further evaluation of heart function.  4. Chronic Fatigue and Malaise - Ongoing major contributor to patient's PHQ.  Patient denies depression. - Despite his continued elevated PHQ score of 12, it is unlikely to be a matter of depression.  Patient continues to feel that he is very old, "the last living member of his family," and that it is difficult to move around or enjoy much due to his age.  - Wife strongly insists that they do not feel he is depressed. Pt declined medications or other treatments at this time.  - Stressed the importance of moving as much as possible, walking around the house  more, as well as doing daily mentally engaging activities such as resuming gardening or listening to books on tape.   5. Vitamin D Deficiency - Continue supplements as listed below.  6. Cancer  - Patient with h/o prostate cancer.  - Follows up with urology yearly. Last PSA WNL.  7. Elevated TSH   - Consistent with subclinical hypothyroidism. T4 T3 historically stable. - Due for re-check today; labs will be drawn.  8. Bilateral Lower Extremity Edema - Encouraged patient to get up and move more, especially using a wheeled walker on level ground.  Patient should continue to elevate legs above his heart whenever possible.   9. Atypical Atrial Flutter - Due to risk of irregular heart beat, h/o cancer, etc. continue taking baby aspirin as listed below.  10. Lifestyle & Physical Activity - Advised patient to continue working toward movement to improve health.  Patient knows the importance of physical activity, continuing to work on his strength, and practicing motions to prevent muscular atrophy and increase his physical conditioning.  - Encouraged patient to find things he enjoys doing, such as gardening in planter pots on the flat surface of his carport, listening to books on tape, or other feasible hobbies for his physical status.  - Healthy dietary habits encouraged, including low-carb, and high amounts of lean protein in diet.   - Patient should also consume adequate amounts of water - half of body weight in oz of water per day.  11. Follow-Up - Patient is due  for cholesterol re-check and blood work, including TSH to evaluate fatigue and energy levels.  - Continue to follow up in 4-6 months for regularly scheduled chronic health maintenance, or sooner if needed.   Education and routine counseling performed. Handouts provided.  Orders Placed This Encounter  Procedures  . CBC with Differential/Platelet  . Comprehensive metabolic panel  . Hemoglobin A1c  . Lipid panel  .  Vitamin B12  . VITAMIN D 25 Hydroxy (Vit-D Deficiency, Fractures)  . TSH  . T4, free    No orders of the defined types were placed in this encounter.    The patient was counseled, risk factors were discussed, anticipatory guidance given.  Gross side effects, risk and benefits, and alternatives of medications discussed with patient.  Patient is aware that all medications have potential side effects and we are unable to predict every side effect or drug-drug interaction that may occur.  Expresses verbal understanding and consents to current therapy plan and treatment regimen.  Return for f/up 4 [place] sooner as needed.  Please see AVS handed out to patient at the end of our visit for further patient instructions/ counseling done pertaining to today's office visit.    Note: This document was prepared using Dragon voice recognition software and may include unintentional dictation errors.   This document serves as a record of services personally performed by DMellody Dance DO. It was created on her behalf by KToni Amend a trained medical scribe. The creation of this record is based on the scribe's personal observations and the provider's statements to them.   I have reviewed the above medical documentation for accuracy and completeness and I concur.  DMellody Dance06/30/19 10:46 PM    Subjective:    HPI: Trevor ENCINASis a 84y.o. male [facility]    Patient has a walker at home but prefers to use his cane.  Patient admits that he is usually inactive.  States that he used to go out in the garden often, but now his wife hollers at him when he tries to set foot out there, because she's worried he might fall.  When questioned about his PHQ score, patient corrects several scores and values.  He feels unmotivated to do things because they are more difficult at his age.  He doesn't have trouble  concentrating on things; he just can't read the paper because he can't see as well.  Patient moves slowly due to his old age.  He states "I wasn't supposed to live this long - I'm the only one in my family left."  Notes that this doesn't make him sad, but "I never thought I'd live this long."  He does not wish he was dead.  His wife notes that he's not depressed, "he just doesn't feel good."  She thinks he does not need to be on anything for his mood.  She says "he just doesn't, because he is not depressed.  He doesn't seem depressed, let's just put it that way."  When it comes to activities he enjoys, he confirms that he enjoys going out every week with his friends.  He jokes "I like sitting on my butt; if someone's not hollering at me [it's pleasurable]."  Patient is unable to read due to his eyesight.  Follows up with Dr. SBaird Cancerfor his visual health.  Wife notes that nothing can be done to improve his eyesight.  Denies new chest pain, SOB, dizziness, lightheadedness, headaches.  Denies any new heart issues or issues with racing heart.  Patient notes that his leg swelling has remained the same as usual.  HTN:  -  His blood pressure has been controlled at home.   - Patient reports good compliance with blood pressure medications  - Denies medication S-E   - Smoking Status noted   - He denies new onset of: chest pain, exercise intolerance, shortness of breath, dizziness, visual changes, headache, lower extremity swelling or claudication.    Last 3 blood pressure readings in our office are as follows: BP Readings from Last 3 Encounters:  11/07/17 126/82  07/10/17 136/79  01/31/17 114/65    Pulse Readings from Last 3 Encounters:  11/07/17 63  07/10/17 73  01/31/17 69    Filed Weights   11/07/17 1030  Weight: 186 lb 9.6 oz (84.6 kg)    1. 84 y.o. male here for cholesterol follow-up.   - Patient reports good compliance with medications or treatment plan  - Denies medication  S-E   - Smoking Status noted   - He denies new onset of: chest pain, exercise intolerance, shortness of breath, dizziness, visual changes, headache, lower extremity swelling or claudication.   No new myalgias  The cholesterol last visit was:  Lab Results  Component Value Date   CHOL 197 11/07/2017   HDL 38 (L) 11/07/2017   LDLCALC 126 (H) 11/07/2017   LDLDIRECT 115.0 09/28/2015   TRIG 164 (H) 11/07/2017   CHOLHDL 5.2 (H) 11/07/2017    Hepatic Function Latest Ref Rng & Units 11/07/2017 03/29/2016 09/28/2015  Total Protein 6.0 - 8.5 g/dL 6.7 6.7 6.8  Albumin 3.5 - 4.7 g/dL 4.3 4.1 4.1  AST 0 - 40 IU/L _0 ALT 0 - 44 IU/L _1 Alk Phosphatase 39 - 117 IU/L 72 84 72  Total Bilirubin 0.0 - 1.2 mg/dL 0.6 0.4 0.6  Bilirubin, Direct 0.0 - 0.3 mg/dL - - -   Depression screen [facility] 2/9 11/07/2017 07/10/2017 09/26/2016 03/30/2015 04/30/2014  Decreased Interest 3 3 0 0 0  Down, Depressed, Hopeless 0 0 0 0 0  PHQ - 2 Score 3 3 0 0 0  Altered sleeping 0 0 0 - -  Tired, decreased energy 0 3 3 - -  Change in appetite 0 0 0 - -  Feeling bad or failure about yourself  0 0 0 - -  Trouble concentrating 0 3 0 - -  Moving slowly or fidgety/restless 0 3 0 - -  Suicidal thoughts 0 0 0 - -  PHQ-9 Score _2 - -      Patient Care Team    Relationship Specialty Notifications Start End  Mellody Dance, DO PCP - General Family Medicine  03/29/16   Rana Snare, MD Consulting Physician Urology  03/30/15    Comment: does not follow regularly with this doc  Sherlynn Stalls, MD Consulting Physician Ophthalmology  03/30/15    Comment: does not follow regularly with this doc  Druscilla Brownie, MD Consulting Physician Dermatology  01/01/16   Dorna Leitz, MD Consulting Physician Orthopedic Surgery  09/26/16   Tat, Eustace Quail, DO Consulting Physician Neurology  09/26/16    Comment: does not follow regularly with this doc  Thompson Grayer, MD Consulting Physician Cardiology  09/26/16    Comment:  does not follow regularly with this doc  Ceasar Mons, MD Consulting Physician Urology  07/10/17  Lab Results  Component Value Date   CREATININE 1.00 11/07/2017   BUN 14 11/07/2017   NA 138 11/07/2017   K 4.3 11/07/2017   CL 102 11/07/2017   CO2 23 11/07/2017    Lab Results  Component Value Date   CHOL 197 11/07/2017   CHOL 187 01/24/2017   CHOL 198 03/29/2016    Lab Results  Component Value Date   HDL 38 (L) 11/07/2017   HDL 34 (L) 01/24/2017   HDL 35 (L) 03/29/2016    Lab Results  Component Value Date   LDLCALC 126 (H) 11/07/2017   LDLCALC 111 (H) 01/24/2017   LDLCALC 103 (H) 03/29/2016    Lab Results  Component Value Date   TRIG 164 (H) 11/07/2017   TRIG 211 (H) 01/24/2017   TRIG 299 (H) 03/29/2016    Lab Results  Component Value Date   CHOLHDL 5.2 (H) 11/07/2017   CHOLHDL 5.5 (H) 01/24/2017   CHOLHDL 5.7 (H) 03/29/2016    Lab Results  Component Value Date   LDLDIRECT 115.0 09/28/2015   LDLDIRECT 120.0 03/30/2015   LDLDIRECT 134.4 07/06/2012   ===================================================================   Patient Active Problem List   Diagnosis Date Noted  . History of cardiac radiofrequency ablation 09/26/2016    Priority: High  . Diastolic dysfunction- echo shows L grade I diastolic ventricular dysfunction 01/01/2016    Priority: High  . Essential hypertension 11/28/2012    Priority: High  . Mixed hyperlipidemia 07/05/2010    Priority: High  . Chronic venous stasis dermatitis of both lower extremities 01/01/2016    Priority: Medium  . Glucose intolerance (impaired glucose tolerance) 01/01/2016    Priority: Medium  . Chronic fatigue and malaise 04/09/2013    Priority: Medium  . h/o Depression- not current 11/28/2012    Priority: Medium  . chronic B/L Peripheral Edema 11/13/2012    Priority: Medium  . Vitamin D deficiency 04/19/2016    Priority: Low  . h/o Shuffling gait due to OA knees/body 09/16/2013     Priority: Low  . Dehydration 05/18/2012    Priority: Low  . Cancer ([place]) 07/10/2017  . Elevated TSH 01/31/2017  . Fatigue 01/31/2017  . Hypertriglyceridemia 01/31/2017  . Low level of high density lipoprotein (HDL) 01/31/2017  . Elevated LDL cholesterol level 01/31/2017  . Impaired memory 09/26/2016  . Vision problems 01/01/2016  . LVH (left ventricular hypertrophy) 01/01/2016  . H/O prostate cancer 10/04/2015  . Frequent falls 06/17/2013  . Allergic rhinitis 08/15/2012  . General medical examination 01/03/2011  . HEMATOCHEZIA 07/05/2010  . RIGHT BUNDLE BRANCH BLOCK 12/25/2009  . TACHYCARDIA 12/25/2009  . Atrial flutter ([place]) 12/24/2009     Past Medical History:  Diagnosis Date  . Atrial flutter ([place])    s/p CTI ablation 8/11  . Cancer [facility])    Prostate cancer  . Family hx colonic polyps   . H/O prostate cancer 10/04/2015  . Hiatal hernia   . Hyperlipidemia   . Hypertension   . RBBB (right bundle branch block)      Past Surgical History:  Procedure Laterality Date  . EYE SURGERY     bilateral cataracts  . HERNIA REPAIR    . PROSTATE SURGERY     seed implant     Family History  Problem Relation Age of Onset  . COPD Father   . Parkinsonism Sister   . Parkinsonism Brother   . Cancer Brother        prostate cancer     Social  History   Substance and Sexual Activity  Drug Use No  ,  Social History   Substance and Sexual Activity  Alcohol Use No   Comment: none now  ,  Social History   Tobacco Use  Smoking Status Former Smoker  . Last attempt to quit: 09/23/1965  . Years since quitting: 52.1  Smokeless Tobacco Never Used  ,    Current Outpatient Medications on File Prior to Visit  Medication Sig Dispense Refill  . Ascorbic Acid (VITAMIN C) 1000 MG tablet Take 1,000 mg by mouth daily.    Marland Kitchen aspirin 81 MG tablet Take 81 mg by mouth daily.    . Cholecalciferol (VITAMIN D3) 5000 units CAPS Take 1 capsule by mouth daily.     . hydrochlorothiazide  (HYDRODIURIL) 12.5 MG tablet Take 1 tablet (12.5 mg total) by mouth daily. 90 tablet 1  . metoprolol succinate (TOPROL-XL) 50 MG 24 hr tablet take 1 tablet by mouth once daily (TAKE WITH OR IMMEDIATELY FOLLOWING A MEAL) 90 tablet 1  . Multiple Vitamins-Minerals (PRESERVISION AREDS 2) CAPS Take 1 capsule by mouth 2 (two) times daily.    . Omega-3 Fatty Acids (FISH OIL) 1000 MG CAPS Take 3 capsules (3,000 mg total) by mouth daily.     No current facility-administered medications on file prior to visit.      No Known Allergies   Review of Systems:   General:  Denies fever, chills Optho/Auditory:   Denies visual changes, blurred vision Respiratory:   Denies SOB, cough, wheeze, DIB  Cardiovascular:   Denies chest pain, palpitations, painful respirations Gastrointestinal:   Denies nausea, vomiting, diarrhea.  Endocrine:     Denies new hot or cold intolerance Musculoskeletal:  Denies joint swelling, gait issues, or new unexplained myalgias/ arthralgias Skin:  Denies rash, suspicious lesions  Neurological:    Denies dizziness, unexplained weakness, numbness  Psychiatric/Behavioral:   Denies mood changes  Objective:    Blood pressure 126/82, pulse 63, height _0  (1.778 m), weight 186 lb 9.6 oz (84.6 kg), SpO2 97 %.  Body mass index is 26.77 kg/m.  General: Well Developed, well nourished, and in no acute distress.  HEENT: Normocephalic, atraumatic, pupils equal round reactive to light, neck supple, No carotid bruits, no JVD Skin: Warm and dry, cap RF less 2 sec Cardiac: Regular rate and rhythm, no a-flutter, S1, S2 WNL's, no murmurs rubs or gallops Respiratory: Bilateral rales, 1/3 up from the base. Not using accessory muscles, speaking in full sentences. NeuroM-Sk: Ambulates w/o assistance, moves ext * 4 w/o difficulty, sensation grossly intact.  Ext: scant edema b/l lower ext Psych: No HI/SI, judgement and insight good, Euthymic mood. Full Affect.

## 2017-11-08 LAB — LIPID PANEL
CHOL/HDL RATIO: 5.2 ratio — AB (ref 0.0–5.0)
Cholesterol, Total: 197 mg/dL (ref 100–199)
HDL: 38 mg/dL — AB (ref >39)
LDL CALC: 126 mg/dL — AB (ref 0–99)
TRIGLYCERIDES: 164 mg/dL — AB (ref 0–149)
VLDL CHOLESTEROL CAL: 33 mg/dL (ref 5–40)

## 2017-11-08 LAB — CBC WITH DIFFERENTIAL/PLATELET
BASOS ABS: 0.1 10*3/uL (ref 0.0–0.2)
Basos: 1 %
EOS (ABSOLUTE): 0.1 10*3/uL (ref 0.0–0.4)
EOS: 1 %
HEMATOCRIT: 40.9 % (ref 37.5–51.0)
HEMOGLOBIN: 14.4 g/dL (ref 13.0–17.7)
IMMATURE GRANS (ABS): 0 10*3/uL (ref 0.0–0.1)
Immature Granulocytes: 0 %
Lymphocytes Absolute: 2 10*3/uL (ref 0.7–3.1)
Lymphs: 27 %
MCH: 32.1 pg (ref 26.6–33.0)
MCHC: 35.2 g/dL (ref 31.5–35.7)
MCV: 91 fL (ref 79–97)
MONOCYTES: 14 %
Monocytes Absolute: 1 10*3/uL — ABNORMAL HIGH (ref 0.1–0.9)
NEUTROS ABS: 4.2 10*3/uL (ref 1.4–7.0)
Neutrophils: 57 %
Platelets: 209 10*3/uL (ref 150–450)
RBC: 4.48 x10E6/uL (ref 4.14–5.80)
RDW: 14.2 % (ref 12.3–15.4)
WBC: 7.4 10*3/uL (ref 3.4–10.8)

## 2017-11-08 LAB — COMPREHENSIVE METABOLIC PANEL
ALT: 12 IU/L (ref 0–44)
AST: 17 IU/L (ref 0–40)
Albumin/Globulin Ratio: 1.8 (ref 1.2–2.2)
Albumin: 4.3 g/dL (ref 3.5–4.7)
Alkaline Phosphatase: 72 IU/L (ref 39–117)
BUN / CREAT RATIO: 14 (ref 10–24)
BUN: 14 mg/dL (ref 8–27)
Bilirubin Total: 0.6 mg/dL (ref 0.0–1.2)
CO2: 23 mmol/L (ref 20–29)
CREATININE: 1 mg/dL (ref 0.76–1.27)
Calcium: 9.9 mg/dL (ref 8.6–10.2)
Chloride: 102 mmol/L (ref 96–106)
GFR calc non Af Amer: 66 mL/min/{1.73_m2} (ref >59)
GFR, EST AFRICAN AMERICAN: 77 mL/min/{1.73_m2} (ref >59)
GLUCOSE: 95 mg/dL (ref 65–99)
Globulin, Total: 2.4 g/dL (ref 1.5–4.5)
Potassium: 4.3 mmol/L (ref 3.5–5.2)
Sodium: 138 mmol/L (ref 134–144)
TOTAL PROTEIN: 6.7 g/dL (ref 6.0–8.5)

## 2017-11-08 LAB — HEMOGLOBIN A1C
Est. average glucose Bld gHb Est-mCnc: 114 mg/dL
Hgb A1c MFr Bld: 5.6 % (ref 4.8–5.6)

## 2017-11-08 LAB — TSH: TSH: 5.35 u[IU]/mL — ABNORMAL HIGH (ref 0.450–4.500)

## 2017-11-08 LAB — VITAMIN B12: Vitamin B-12: 1887 pg/mL — ABNORMAL HIGH (ref 232–1245)

## 2017-11-08 LAB — VITAMIN D 25 HYDROXY (VIT D DEFICIENCY, FRACTURES): Vit D, 25-Hydroxy: 28 ng/mL — ABNORMAL LOW (ref 30.0–100.0)

## 2017-11-08 LAB — T4, FREE: Free T4: 1.1 ng/dL (ref 0.82–1.77)

## 2017-11-23 ENCOUNTER — Encounter: Payer: Self-pay | Admitting: Family Medicine

## 2017-11-23 ENCOUNTER — Ambulatory Visit (INDEPENDENT_AMBULATORY_CARE_PROVIDER_SITE_OTHER): Payer: Medicare Other | Admitting: Family Medicine

## 2017-11-23 VITALS — BP 126/73 | HR 72 | Ht 70.0 in | Wt 189.0 lb

## 2017-11-23 DIAGNOSIS — R000 Tachycardia, unspecified: Secondary | ICD-10-CM

## 2017-11-23 DIAGNOSIS — R7989 Other specified abnormal findings of blood chemistry: Secondary | ICD-10-CM | POA: Diagnosis not present

## 2017-11-23 DIAGNOSIS — R5383 Other fatigue: Secondary | ICD-10-CM

## 2017-11-23 DIAGNOSIS — I10 Essential (primary) hypertension: Secondary | ICD-10-CM

## 2017-11-23 DIAGNOSIS — Z8639 Personal history of other endocrine, nutritional and metabolic disease: Secondary | ICD-10-CM | POA: Insufficient documentation

## 2017-11-23 DIAGNOSIS — E559 Vitamin D deficiency, unspecified: Secondary | ICD-10-CM

## 2017-11-23 DIAGNOSIS — E782 Mixed hyperlipidemia: Secondary | ICD-10-CM | POA: Diagnosis not present

## 2017-11-23 DIAGNOSIS — E786 Lipoprotein deficiency: Secondary | ICD-10-CM | POA: Diagnosis not present

## 2017-11-23 DIAGNOSIS — E781 Pure hyperglyceridemia: Secondary | ICD-10-CM

## 2017-11-23 DIAGNOSIS — R7302 Impaired glucose tolerance (oral): Secondary | ICD-10-CM | POA: Diagnosis not present

## 2017-11-23 DIAGNOSIS — F341 Dysthymic disorder: Secondary | ICD-10-CM | POA: Diagnosis not present

## 2017-11-23 MED ORDER — METOPROLOL SUCCINATE ER 50 MG PO TB24: ORAL_TABLET | ORAL | 1 refills | Status: DC

## 2017-11-23 MED ORDER — VITAMIN D (ERGOCALCIFEROL) 1.25 MG (50000 UNIT) PO CAPS: ORAL_CAPSULE | ORAL | 10 refills | Status: DC

## 2017-11-23 MED ORDER — LEVOTHYROXINE SODIUM 25 MCG PO TABS: 25.0000 ug | ORAL_TABLET | Freq: Every day | ORAL | 3 refills | Status: DC

## 2017-11-23 NOTE — Patient Instructions (Addendum)
Vitamin D Directions Begin taking Vitamin D prescription once weekly, and continue over-the-counter 5000 IU of Vitamin D3 daily. Once the daily dose of Vitamin D runs out, begin taking Vitamin D prescription twice weekly, on Wednesday and Sunday.  Also please change your B complex vitamin to every other day instead of daily.  Please take note of your diet and try to move more which will help your cholesterol.  Try to read nutrition labels and avoid foods high in saturated and trans-fats.  Also trying to drink more water and move more will help you with your energy levels.  In addition we are going to start low-dose Synthroid in hopes to improve those energy levels and correct your thyroid levels in the blood.      Guidelines for a Low Cholesterol, Low Saturated Fat Diet   Fats - Limit total intake of fats and oils. - Avoid butter, stick margarine, shortening, lard, palm and coconut oils. - Limit mayonnaise, salad dressings, gravies and sauces, unless they are homemade with low-fat ingredients. - Limit chocolate. - Choose low-fat and nonfat products, such as low-fat mayonnaise, low-fat or non-hydrogenated peanut butter, low-fat or fat-free salad dressings and nonfat gravy. - Use vegetable oil, such as canola or olive oil. - Look for margarine that does not contain trans fatty acids. - Use nuts in moderate amounts. - Read ingredient labels carefully to determine both amount and type of fat present in foods. Limit saturated and trans fats! - Avoid high-fat processed and convenience foods.  Meats and Meat Alternatives - Choose fish, chicken, [place] and lean meats. - Use dried beans, peas, lentils and tofu. - Limit egg yolks to three to four per week. - If you eat red meat, limit to no more than three servings per week and choose loin or round cuts. - Avoid fatty meats, such as bacon, sausage, franks, luncheon meats and ribs. - Avoid all organ meats, including liver.  Dairy - Choose  nonfat or low-fat milk, yogurt and cottage cheese. - Most cheeses are high in fat. Choose cheeses made from non-fat milk, such as mozzarella and ricotta cheese. - Choose light or fat-free cream cheese and sour cream. - Avoid cream and sauces made with cream.  Fruits and Vegetables - Eat a wide variety of fruits and vegetables. - Use lemon juice, vinegar or "mist" olive oil on vegetables. - Avoid adding sauces, fat or oil to vegetables.  Breads, Cereals and Grains - Choose whole-grain breads, cereals, pastas and rice. - Avoid high-fat snack foods, such as granola, cookies, pies, pastries, doughnuts and croissants.  Cooking Tips - Avoid deep fried foods. - Trim visible fat off meats and remove skin from poultry before cooking. - Bake, broil, boil, poach or roast poultry, fish and lean meats. - Drain and discard fat that drains out of meat as you cook it. - Add little or no fat to foods. - Use vegetable oil sprays to grease pans for cooking or baking. - Steam vegetables. - Use herbs or no-oil marinades to flavor foods.

## 2017-11-23 NOTE — Progress Notes (Signed)
Assessment and plan:  1. Essential hypertension   2. Mixed hyperlipidemia   3. Hypertriglyceridemia   4. Low level of high density lipoprotein (HDL)   5. Glucose intolerance (impaired glucose tolerance)   6. Vitamin D deficiency   7. Elevated TSH-  subclinical hypothyroidism with symptoms   8. Fatigue, unspecified type   9. Dysthymia   10. History of non anemic vitamin B12 deficiency   11. h/o Tachycardia     Labs Drawn 11/07/2017 Reviewed Today: Blood Counts = WNL. Free T4 = WNL. Kidney Health = WNL. - Encouraged patient to drink more water to adequately hydrate. Electrolytes = WNL. Liver Health = WNL. HA1c = WNL; 5.6.  1. Mixed Hyperlipemia - Last cholesterol measurement obtained 11/07/2017. - Triglycerides = 164. - HDL = 38. - LDL = 126.  - Reviewed that LDL should be under 100.  At 54, discussed that chances of side-effects of cholesterol medicine go up, especially if he remains inadequately hydrated.  - Dietary changes such as low saturated & trans fat and low carb/ ketogenic diets discussed with patient.  Encouraged regular exercise and weight loss when appropriate.   - Strongly encouraged daily physical activity, walking with the assistance of his cane.  - Patient declines medication at this time.  Agrees to start trying to walk a little more, and eat a little better, and re-check in 6 months.  - Educational handouts provided at patient's desire.  2. B12 - elevated - To reduce B12 value, advised patient to take B complex once every other day.  3. Vitamin D Deficiency - Add once weekly Vitamin D prescription to patient's routine. - Continue with weekly OTC 5000 IU Vitamin D until this runs out. - Once patient finishes OTC Vitamin D, will take 50k IU Vitamin D prescription twice weekly.  4. Elevated TSH = 5.350; Subclinical Hypothyroidism w/Symptoms - Synthroid advised and discussed today.   -  Prescription provided today for 25 mcg synthroid.  Will continue to monitor. - T4 T3  historically stable. - Re-check thyroid in 6-8 weeks.  5. Essential Hypertension - Patient's blood pressure continues to be well-controlled. - Symptoms stable at this time; patient asymptomatic.  - Continue treatment and medications as listed below.   6. Chronic Fatigue and Malaise - Ongoing major contributor to patient's PHQ.  Patient denies depression.  - Wife and daughter strongly insist that they do not feel he is depressed.  Pt agreed to begin thyroid medication today in an effort to improve his energy levels.  - Stressed the importance of moving as much as possible, walking around the house more, as well as doing daily mentally engaging activities such as resuming gardening or listening to books on tape.   7. H/o Cardiac Radiofrequency Ablation - Patient remains asymptomatic and in normal rhythm.  - Does not have a current cardiologist.  Previously saw Dr. Rayann Heman, and per last appointment, has not seen them since 2011.  Only visits cards PRN.   8. H/o Prostate Cancer - Patient with h/o prostate cancer.  - Follows up with urology yearly. Last PSA WNL.  9. Bilateral Lower Extremity Edema - Encouraged patient to get up and move more, especially using a wheeled walker on level ground.  Patient should continue to elevate legs above his heart whenever possible.   10. Atypical Atrial Flutter - Due to risk of irregular heart beat, h/o cancer, etc. continue taking baby aspirin as listed below.  11. Lifestyle & Physical  Activity - Advised patient to continue working toward movement to improve health.  Patient knows the importance of physical activity, continuing to work on his strength, and practicing motions to prevent muscular atrophy and increase his physical conditioning.  - Healthy dietary habits encouraged, including low-carb, and high amounts of lean protein in diet.   - Patient should  also consume adequate amounts of water - half of body weight in oz of water per day.  12. Follow-Up - Continue to follow up in 4-6 months for regularly scheduled chronic health maintenance, or sooner if needed.  - Patient declined cholesterol medication at this time, but agreed to start trying to walk a little more, be more physically active, and eat a more prudent diet.  Re-check cholesterol in 6 months to evaluate progress on lifestyle changes.  - Re-check TSH in 6-8 weeks to evaluate progress on medication.   Education and routine counseling performed. Handouts provided.  Orders Placed This Encounter  Procedures  . TSH+T4F+T3Free    Meds ordered this encounter  Medications  . levothyroxine (SYNTHROID, LEVOTHROID) 25 MCG tablet    Sig: Take 1 tablet (25 mcg total) by mouth daily.    Dispense:  90 tablet    Refill:  3  . Vitamin D, Ergocalciferol, (DRISDOL) 50000 units CAPS capsule    Sig: 1 tablet every Wednesday and every Sunday    Dispense:  24 capsule    Refill:  10  . metoprolol succinate (TOPROL-XL) 50 MG 24 hr tablet    Sig: take 1 tablet by mouth once daily (TAKE WITH OR IMMEDIATELY FOLLOWING A MEAL)    Dispense:  90 tablet    Refill:  1     Return for Recheck labs in 6 weeks for lab only visit; follow-up 4 months- will reck Vit D, B12, FLP.   Anticipatory guidance and routine counseling done re: condition, txmnt options and need for follow up. All questions of patient's were answered.   Gross side effects, risk and benefits, and alternatives of medications discussed with patient.  Patient is aware that all medications have potential side effects and we are unable to predict every sideeffect or drug-drug interaction that may occur.  Expresses verbal understanding and consents to current therapy plan and treatment regiment.  Please see AVS handed out to patient at the end of our visit for additional patient instructions/ counseling done pertaining to today's office  visit.  Note: This document was prepared using Dragon voice recognition software and may include unintentional dictation errors.   This document serves as a record of services personally performed by Mellody Dance, DO. It was created on her behalf by Toni Amend, a trained medical scribe. The creation of this record is based on the scribe's personal observations and the provider's statements to them.   I have reviewed the above medical documentation for accuracy and completeness and I concur.  Mellody Dance 12/14/17 10:21 AM   ----------------------------------------------------------------------------------------------------------------------  Subjective:   CC:   Trevor Galloway is a 84 y.o. male who presents to [facility] today for review and discussion of recent bloodwork that was done.  1. All recent blood work that we ordered was reviewed with patient today.  Patient was counseled on all abnormalities and we discussed dietary and lifestyle changes that could help those values (also medications when appropriate).  Extensive health counseling performed and all patient's concerns/ questions were addressed.   Patient is here today with his wife and his daughter.  Hyperlipidemia & Cholesterol Management Patient is not currently managed on cholesterol medication.  Wife notes that he could never take the medication in the past since he ate grapefruit every morning, which counteracted his cholesterol medication at the time.  They eat bacon every morning.  For lunch, patient also eats a lot of peaches and cottage cheese, sometimes a sandwich.  Patient states "sometimes a cracker."  Daughter notes that the patient and his wife eat a lot of microwave meals, and that they do enjoy chicken breaded and baked in breadcrumbs and butter.  Patient's Chronic Malaise Notes that he is always sluggish.  Per patient, he isn't sure why he feels sluggish.     Denies feeling depressed or sad; wife and daughter note he's not sad, he just says "he doesn't feel good today."  Patient is willing to try synthroid today to try to alleviate his feelings of sluggishness and fatigue.    Wt Readings from Last 3 Encounters:  11/23/17 189 lb (85.7 kg)  11/07/17 186 lb 9.6 oz (84.6 kg)  07/10/17 189 lb 14.4 oz (86.1 kg)   BP Readings from Last 3 Encounters:  11/23/17 126/73  11/07/17 126/82  07/10/17 136/79   Pulse Readings from Last 3 Encounters:  11/23/17 72  11/07/17 63  07/10/17 73   BMI Readings from Last 3 Encounters:  11/23/17 27.12 kg/m  11/07/17 26.77 kg/m  07/10/17 27.25 kg/m     Patient Care Team    Relationship Specialty Notifications Start End  Mellody Dance, DO PCP - General Family Medicine  03/29/16   Rana Snare, MD Consulting Physician Urology  03/30/15    Comment: does not follow regularly with this doc  Sherlynn Stalls, MD Consulting Physician Ophthalmology  03/30/15    Comment: does not follow regularly with this doc  Druscilla Brownie, MD Consulting Physician Dermatology  01/01/16   Dorna Leitz, MD Consulting Physician Orthopedic Surgery  09/26/16   Tat, Eustace Quail, [place] Physician Neurology  09/26/16    Comment: does not follow regularly with this doc  Thompson Grayer, MD Consulting Physician Cardiology  09/26/16    Comment: does not follow regularly with this doc  Ceasar Mons, MD Consulting Physician Urology  07/10/17     Full medical history updated and reviewed in the office today  Patient Active Problem List   Diagnosis Date Noted  . History of cardiac radiofrequency ablation 09/26/2016    Priority: High  . Diastolic dysfunction- echo shows L grade I diastolic ventricular dysfunction 01/01/2016    Priority: High  . Essential hypertension 11/28/2012    Priority: High  . Mixed hyperlipidemia 07/05/2010    Priority: High  . Chronic venous stasis dermatitis of both lower extremities  01/01/2016    Priority: Medium  . Glucose intolerance (impaired glucose tolerance) 01/01/2016    Priority: Medium  . Chronic fatigue and malaise 04/09/2013    Priority: Medium  . h/o Depression- not current 11/28/2012    Priority: Medium  . chronic B/L Peripheral Edema 11/13/2012    Priority: Medium  . Vitamin D deficiency 04/19/2016    Priority: Low  . h/o Shuffling gait due to OA knees/body 09/16/2013    Priority: Low  . Dehydration 05/18/2012    Priority: Low  . History of non anemic vitamin B12 deficiency 11/23/2017  . Cancer ([place]) 07/10/2017  . Elevated TSH 01/31/2017  . Fatigue 01/31/2017  . Hypertriglyceridemia 01/31/2017  . Low level of high density lipoprotein (HDL) 01/31/2017  . Elevated LDL cholesterol  level 01/31/2017  . Impaired memory 09/26/2016  . Vision problems 01/01/2016  . LVH (left ventricular hypertrophy) 01/01/2016  . H/O prostate cancer 10/04/2015  . Frequent falls 06/17/2013  . Allergic rhinitis 08/15/2012  . General medical examination 01/03/2011  . HEMATOCHEZIA 07/05/2010  . RIGHT BUNDLE BRANCH BLOCK 12/25/2009  . TACHYCARDIA 12/25/2009  . Atrial flutter ([place]) 12/24/2009    Past Medical History:  Diagnosis Date  . Atrial flutter ([place])    s/p CTI ablation 8/11  . Cancer [facility])    Prostate cancer  . Family hx colonic polyps   . H/O prostate cancer 10/04/2015  . Hiatal hernia   . Hyperlipidemia   . Hypertension   . RBBB (right bundle branch block)     Past Surgical History:  Procedure Laterality Date  . EYE SURGERY     bilateral cataracts  . HERNIA REPAIR    . PROSTATE SURGERY     seed implant    Social History   Tobacco Use  . Smoking status: Former Smoker    Last attempt to quit: 09/23/1965    Years since quitting: 52.2  . Smokeless tobacco: Never Used  Substance Use Topics  . Alcohol use: No    Comment: none now    Family Hx: Family History  Problem Relation Age of Onset  . COPD Father   . Parkinsonism Sister   .  Parkinsonism Brother   . Cancer Brother        prostate cancer     Medications: Current Outpatient Medications  Medication Sig Dispense Refill  . Ascorbic Acid (VITAMIN C) 1000 MG tablet Take 1,000 mg by mouth daily.    Marland Kitchen aspirin 81 MG tablet Take 81 mg by mouth daily.    . Cholecalciferol (VITAMIN D3) 5000 units CAPS Take 1 capsule by mouth daily.     . hydrochlorothiazide (HYDRODIURIL) 12.5 MG tablet Take 1 tablet (12.5 mg total) by mouth daily. 90 tablet 1  . metoprolol succinate (TOPROL-XL) 50 MG 24 hr tablet take 1 tablet by mouth once daily (TAKE WITH OR IMMEDIATELY FOLLOWING A MEAL) 90 tablet 1  . Multiple Vitamins-Minerals (PRESERVISION AREDS 2) CAPS Take 1 capsule by mouth 2 (two) times daily.    . Omega-3 Fatty Acids (FISH OIL) 1000 MG CAPS Take 3 capsules (3,000 mg total) by mouth daily.    Marland Kitchen levothyroxine (SYNTHROID, LEVOTHROID) 25 MCG tablet Take 1 tablet (25 mcg total) by mouth daily. 90 tablet 3  . Vitamin D, Ergocalciferol, (DRISDOL) 50000 units CAPS capsule 1 tablet every Wednesday and every Sunday 24 capsule 10   No current facility-administered medications for this visit.     Allergies:  No Known Allergies   Review of Systems: General:   No F/C, wt loss Pulm:   No DIB, SOB, pleuritic chest pain Card:  No CP, palpitations Abd:  No n/v/d or pain Ext:  No inc edema from baseline  Objective:  Blood pressure 126/73, pulse 72, height 5\' 10"  (1.778 m), weight 189 lb (85.7 kg), SpO2 94 %. Body mass index is 27.12 kg/m. General: Well Developed, well nourished, and in no acute distress.  HEENT: Normocephalic, atraumatic, pupils equal round reactive to light, neck supple, no JVD Skin: Warm and dry, cap RF less 2 sec Cardiac: Regular rate and rhythm, no a-flutter, S1, S2 WNL's, no murmurs rubs or gallops Respiratory: Decreased aeration globally. Not using accessory muscles, speaking in full sentences. NeuroM-Sk: Ambulates w/o assistance, moves ext * 4 w/o difficulty,  sensation grossly intact.  Ext: scant edema b/l lower ext Psych: No HI/SI, judgement and insight good, Euthymic mood. Full Affect.   Recent Results (from the past 2160 hour(s))  CBC with Differential/Platelet     Status: Abnormal   Collection Time: 11/07/17 11:33 AM  Result Value Ref Range   WBC 7.4 3.4 - 10.8 x10E3/uL   RBC 4.48 4.14 - 5.80 x10E6/uL   Hemoglobin 14.4 13.0 - 17.7 g/dL   Hematocrit 40.9 37.5 - 51.0 %   MCV 91 79 - 97 fL   MCH 32.1 26.6 - 33.0 pg   MCHC 35.2 31.5 - 35.7 g/dL   RDW 14.2 12.3 - 15.4 %   Platelets 209 150 - 450 x10E3/uL   Neutrophils 57 Not Estab. %   Lymphs 27 Not Estab. %   Monocytes 14 Not Estab. %   Eos 1 Not Estab. %   Basos 1 Not Estab. %   Neutrophils Absolute 4.2 1.4 - 7.0 x10E3/uL   Lymphocytes Absolute 2.0 0.7 - 3.1 x10E3/uL   Monocytes Absolute 1.0 (H) 0.1 - 0.9 x10E3/uL   EOS (ABSOLUTE) 0.1 0.0 - 0.4 x10E3/uL   Basophils Absolute 0.1 0.0 - 0.2 x10E3/uL   Immature Granulocytes 0 Not Estab. %   Immature Grans (Abs) 0.0 0.0 - 0.1 x10E3/uL  Comprehensive metabolic panel     Status: None   Collection Time: 11/07/17 11:33 AM  Result Value Ref Range   Glucose 95 65 - 99 mg/dL   BUN 14 8 - 27 mg/dL   Creatinine, Ser 1.00 0.76 - 1.27 mg/dL   GFR calc non Af Amer 66 >59 mL/min/1.73   GFR calc Af Amer 77 >59 mL/min/1.73   BUN/Creatinine Ratio 14 10 - 24   Sodium 138 134 - 144 mmol/L   Potassium 4.3 3.5 - 5.2 mmol/L   Chloride 102 96 - 106 mmol/L   CO2 23 20 - 29 mmol/L   Calcium 9.9 8.6 - 10.2 mg/dL   Total Protein 6.7 6.0 - 8.5 g/dL   Albumin 4.3 3.5 - 4.7 g/dL   Globulin, Total 2.4 1.5 - 4.5 g/dL   Albumin/Globulin Ratio 1.8 1.2 - 2.2   Bilirubin Total 0.6 0.0 - 1.2 mg/dL   Alkaline Phosphatase 72 39 - 117 IU/L   AST 17 0 - 40 IU/L   ALT 12 0 - 44 IU/L  Hemoglobin A1c     Status: None   Collection Time: 11/07/17 11:33 AM  Result Value Ref Range   Hgb A1c MFr Bld 5.6 4.8 - 5.6 %    Comment:          Prediabetes: 5.7 - 6.4           Diabetes: >6.4          Glycemic control for adults with diabetes: <7.0    Est. average glucose Bld gHb Est-mCnc 114 mg/dL  Lipid panel     Status: Abnormal   Collection Time: 11/07/17 11:33 AM  Result Value Ref Range   Cholesterol, Total 197 100 - 199 mg/dL   Triglycerides 164 (H) 0 - 149 mg/dL   HDL 38 (L) >39 mg/dL   VLDL Cholesterol Cal 33 5 - 40 mg/dL   LDL Calculated 126 (H) 0 - 99 mg/dL   Chol/HDL Ratio 5.2 (H) 0.0 - 5.0 ratio    Comment:  T. Chol/HDL Ratio                                             Men  Women                               1/2 Avg.Risk  3.4    3.3                                   Avg.Risk  5.0    4.4                                2X Avg.Risk  9.6    7.1                                3X Avg.Risk 23.4   11.0   Vitamin B12     Status: Abnormal   Collection Time: 11/07/17 11:33 AM  Result Value Ref Range   Vitamin B-12 1,887 (H) 232 - 1,245 pg/mL  VITAMIN D 25 Hydroxy (Vit-D Deficiency, Fractures)     Status: Abnormal   Collection Time: 11/07/17 11:33 AM  Result Value Ref Range   Vit D, 25-Hydroxy 28.0 (L) 30.0 - 100.0 ng/mL    Comment: Vitamin D deficiency has been defined by the [place] practice guideline as a level of serum 25-OH vitamin D less than 20 ng/mL (1,2). The Endocrine Society went on to further define vitamin D insufficiency as a level between 21 and 29 ng/mL (2). 1. IOM (Institute of Medicine). 2010. Dietary reference    intakes for calcium and D. [place]: The    [employer]. 2. Holick MF, Binkley [place], Bischoff-Ferrari HA, et al.    Evaluation, treatment, and prevention of vitamin D    deficiency: an Endocrine Society clinical practice    guideline. JCEM. 2011 Jul; 96(7):1911-30.   TSH     Status: Abnormal   Collection Time: 11/07/17 11:33 AM  Result Value Ref Range   TSH 5.350 (H) 0.450 - 4.500 uIU/mL  T4, free     Status: None   Collection  Time: 11/07/17 11:33 AM  Result Value Ref Range   Free T4 1.10 0.82 - 1.77 ng/dL

## 2017-12-13 ENCOUNTER — Ambulatory Visit: Payer: PRIVATE HEALTH INSURANCE | Admitting: Family Medicine

## 2018-01-01 ENCOUNTER — Other Ambulatory Visit: Payer: Medicare Other

## 2018-01-01 DIAGNOSIS — F341 Dysthymic disorder: Secondary | ICD-10-CM

## 2018-01-01 DIAGNOSIS — R7989 Other specified abnormal findings of blood chemistry: Secondary | ICD-10-CM | POA: Diagnosis not present

## 2018-01-01 DIAGNOSIS — R5383 Other fatigue: Secondary | ICD-10-CM

## 2018-01-02 LAB — TSH+T4F+T3FREE
Free T4: 1.19 ng/dL (ref 0.82–1.77)
T3 FREE: 2 pg/mL (ref 2.0–4.4)
TSH: 3.24 u[IU]/mL (ref 0.450–4.500)

## 2018-01-08 ENCOUNTER — Other Ambulatory Visit: Payer: PRIVATE HEALTH INSURANCE

## 2018-02-12 DIAGNOSIS — Z23 Encounter for immunization: Secondary | ICD-10-CM | POA: Diagnosis not present

## 2018-02-27 ENCOUNTER — Ambulatory Visit (INDEPENDENT_AMBULATORY_CARE_PROVIDER_SITE_OTHER): Payer: Medicare Other | Admitting: Family Medicine

## 2018-02-27 ENCOUNTER — Encounter: Payer: Self-pay | Admitting: Family Medicine

## 2018-02-27 VITALS — BP 133/81 | HR 60 | Ht 70.0 in | Wt 185.0 lb

## 2018-02-27 DIAGNOSIS — R5381 Other malaise: Secondary | ICD-10-CM

## 2018-02-27 DIAGNOSIS — R7989 Other specified abnormal findings of blood chemistry: Secondary | ICD-10-CM | POA: Diagnosis not present

## 2018-02-27 DIAGNOSIS — I10 Essential (primary) hypertension: Secondary | ICD-10-CM

## 2018-02-27 DIAGNOSIS — E559 Vitamin D deficiency, unspecified: Secondary | ICD-10-CM

## 2018-02-27 DIAGNOSIS — R5382 Chronic fatigue, unspecified: Secondary | ICD-10-CM

## 2018-02-27 DIAGNOSIS — Z8639 Personal history of other endocrine, nutritional and metabolic disease: Secondary | ICD-10-CM

## 2018-02-27 NOTE — Progress Notes (Signed)
Impression and Recommendations:    1. Essential hypertension   2. Chronic fatigue and malaise   3. Vitamin D deficiency   4. Elevated TSH   5. History of non anemic vitamin B12 deficiency     1. EssentialHypertension - Patient's blood pressure continues to bewell-controlled. - Symptomsstable at this time; patientasymptomatic. -Continuetreatment and medicationsas listed below.  - No changes made today.  - H/oCardiacRadiofrequencyAblation - Patient remains asymptomatic and in normal rhythm.  -Does not have a current cardiologist. Previously saw Dr. Rayann Heman, andhas not seen them since 2011. Only visits cards PRN.   - AtypicalAtrialFlutter - Due to risk of irregular heart beat, h/o cancer, etc. continue taking baby aspirin and medications established as per med list.  2. ChronicFatigue andMalaise -Ongoingmajor contributor to patient'sPHQ. Patient continues to deny depression.  - Wife strongly insiststhat she does not feel he is depressed.  Pt began thyroid medication last appointment, but no improvement in energy levels.  - Stressed the importance ofmoving as much as possible, walkingaround the house more, as well as doing daily mentally engaging activities.   - Discussed that increased fatigue is natural as we age.  Encouraged patient to increase physical activity, such as going to the mailbox every day, 2-3 times per day if possible.  3. Vitamin DDeficiency - Continue 50k IU Vitamin D prescription twice weekly.  See med list today.  4. H/o Subclinical Hypothyroidism w/Symptoms - Thyroid medication changed last appointment, July 2019. - Patient obtained repeat thyroid on 01/01/2018 - WNL. - Continue medications per med list.  Will continue to monitor. - T4 T3 historicallystable. - Re-check thyroid as recommended.  5. B12 - elevated - Continue to monitor.  Patient knows not to take B complex daily.  6. Lifestyle & Physical Activity -  Advised patient to continue working towardmovementto improve health. Patient knows the importance of physical activity, continuing to work on his strength, and practicing motions to prevent muscular atrophy and increase his physical conditioning.  -Encouragedpatientto get up and move more, especiallyusing a wheeled walker on level ground. Patient should continue to elevatelegs abovehisheart wheneverpossible.   - Healthy dietary habits encouraged, including low-carb, and high amounts of lean protein in diet.   - Patient should also consume adequate amounts of water - half of body weight in oz of water per day.  7. Follow-Up - Continue to follow up in 4-6 monthsfor regularly scheduled chronic health maintenance, or sooner if needed.  - Patient declined cholesterol medication last check, but agreed to start trying to walk a little more, be more physically active, and eat a more prudent diet.    - Cholesterol re-checked today to evaluate progress on lifestyle changes. - Vitamin D and B12 checked today.   Education and routine counseling performed. Handouts provided.  Orders Placed This Encounter  Procedures  . VITAMIN D 25 Hydroxy (Vit-D Deficiency, Fractures)  . B12    The patient was counseled, risk factors were discussed, anticipatory guidance given.  Gross side effects, risk and benefits, and alternatives of medications discussed with patient.  Patient is aware that all medications have potential side effects and we are unable to predict every side effect or drug-drug interaction that may occur.  Expresses verbal understanding and consents to current therapy plan and treatment regimen.  Return for 4-59mo FLP reck and then OV with me 1 wk after.  Please see AVS handed out to patient at the end of our visit for further patient instructions/ counseling done pertaining  to today's office visit.    Note:  This document was prepared using Dragon voice recognition software  and may include unintentional dictation errors.  This document serves as a record of services personally performed by Mellody Dance, DO. It was created on her behalf by Toni Amend, a trained medical scribe. The creation of this record is based on the scribe's personal observations and the provider's statements to them.   I have reviewed the above medical documentation for accuracy and completeness and I concur.  Mellody Dance 02/27/18 5:10 PM     Subjective:    HPI: Trevor Galloway is a 84 y.o. male who [facility] to [facility] today for follow up of [place].    Last visit was in July.  Thyroid medication was modified at that time.  Wife thinks that his energy levels have not improved, and perhaps worsened since the change in thyroid medication.  However, patient is always discussing ongoing fatigue during appointments.  Exercise Habits at Home Patient gets up and walks around a little bit at home.  States "I go to the mailbox once in a while."  Wife states that he goes down to [employer] and gets the paper every day.  Wife feels that he doesn't do much these days.  "He is just not very active anymore."  Sleeping Habits Feels he is sleeping okay.  Wife notes that he usually sleeps through the night.  Wife states that "when he has an appointment in the morning, he gets up early and ready."  Mood Wife denies depression.  States "he doesn't seem to be depressed." Patient denies depression.  Denies excessive worries about death or otherwise.  HTN:  -  His blood pressure has been controlled at home.  Pt has been checking it regularly.  - Patient reports good compliance with blood pressure medications. He has been on metoprolol for an unknown amount of time.  - Denies medication S-E   - Smoking Status noted   - He denies new onset of: chest pain, exercise intolerance, shortness of breath, dizziness, visual changes, headache,  lower extremity swelling or claudication.    Last 3 blood pressure readings in our office are as follows: BP Readings from Last 3 Encounters:  02/27/18 133/81  11/23/17 126/73  11/07/17 126/82    Pulse Readings from Last 3 Encounters:  02/27/18 60  11/23/17 72  11/07/17 63    Filed Weights   02/27/18 1024  Weight: 185 lb (83.9 kg)      Patient Care Team    Relationship Specialty Notifications Start End  Mellody Dance, DO PCP - General Family Medicine  03/29/16   Rana Snare, MD Consulting Physician Urology  03/30/15    Comment: does not follow regularly with this doc  Sherlynn Stalls, MD Consulting Physician Ophthalmology  03/30/15    Comment: does not follow regularly with this doc  Druscilla Brownie, MD Consulting Physician Dermatology  01/01/16   Dorna Leitz, MD Consulting Physician Orthopedic Surgery  09/26/16   Tat, Eustace Quail, DO Consulting Physician Neurology  09/26/16    Comment: does not follow regularly with this doc  Thompson Grayer, MD Consulting Physician Cardiology  09/26/16    Comment: does not follow regularly with this doc  Ceasar Mons, MD Consulting Physician Urology  07/10/17      Lab Results  Component Value Date   CREATININE 1.00 11/07/2017   BUN 14 11/07/2017   NA 138 11/07/2017  K 4.3 11/07/2017   CL 102 11/07/2017   CO2 23 11/07/2017    Lab Results  Component Value Date   CHOL 197 11/07/2017   CHOL 187 01/24/2017   CHOL 198 03/29/2016    Lab Results  Component Value Date   HDL 38 (L) 11/07/2017   HDL 34 (L) 01/24/2017   HDL 35 (L) 03/29/2016    Lab Results  Component Value Date   LDLCALC 126 (H) 11/07/2017   LDLCALC 111 (H) 01/24/2017   LDLCALC 103 (H) 03/29/2016    Lab Results  Component Value Date   TRIG 164 (H) 11/07/2017   TRIG 211 (H) 01/24/2017   TRIG 299 (H) 03/29/2016    Lab Results  Component Value Date   CHOLHDL 5.2 (H) 11/07/2017   CHOLHDL 5.5 (H) 01/24/2017   CHOLHDL 5.7 (H) 03/29/2016     Lab Results  Component Value Date   LDLDIRECT 115.0 09/28/2015   LDLDIRECT 120.0 03/30/2015   LDLDIRECT 134.4 07/06/2012   ===================================================================   Patient Active Problem List   Diagnosis Date Noted  . History of cardiac radiofrequency ablation 09/26/2016    Priority: High  . Diastolic dysfunction- echo shows L grade I diastolic ventricular dysfunction 01/01/2016    Priority: High  . Essential hypertension 11/28/2012    Priority: High  . Mixed hyperlipidemia 07/05/2010    Priority: High  . Chronic venous stasis dermatitis of both lower extremities 01/01/2016    Priority: Medium  . Glucose intolerance (impaired glucose tolerance) 01/01/2016    Priority: Medium  . Chronic fatigue and malaise 04/09/2013    Priority: Medium  . h/o Depression- not current 11/28/2012    Priority: Medium  . chronic B/L Peripheral Edema 11/13/2012    Priority: Medium  . Vitamin D deficiency 04/19/2016    Priority: Low  . h/o Shuffling gait due to OA knees/body 09/16/2013    Priority: Low  . Dehydration 05/18/2012    Priority: Low  . History of non anemic vitamin B12 deficiency 11/23/2017  . Cancer ([place]) 07/10/2017  . Elevated TSH 01/31/2017  . Fatigue 01/31/2017  . Hypertriglyceridemia 01/31/2017  . Low level of high density lipoprotein (HDL) 01/31/2017  . Elevated LDL cholesterol level 01/31/2017  . Impaired memory 09/26/2016  . Vision problems 01/01/2016  . LVH (left ventricular hypertrophy) 01/01/2016  . H/O prostate cancer 10/04/2015  . Frequent falls 06/17/2013  . Allergic rhinitis 08/15/2012  . General medical examination 01/03/2011  . HEMATOCHEZIA 07/05/2010  . RIGHT BUNDLE BRANCH BLOCK 12/25/2009  . TACHYCARDIA 12/25/2009  . Atrial flutter ([place]) 12/24/2009     Past Medical History:  Diagnosis Date  . Atrial flutter ([place])    s/p CTI ablation 8/11  . Cancer [facility])    Prostate cancer  . Family hx colonic polyps   . H/O  prostate cancer 10/04/2015  . Hiatal hernia   . Hyperlipidemia   . Hypertension   . RBBB (right bundle branch block)      Past Surgical History:  Procedure Laterality Date  . EYE SURGERY     bilateral cataracts  . HERNIA REPAIR    . PROSTATE SURGERY     seed implant     Family History  Problem Relation Age of Onset  . COPD Father   . Parkinsonism Sister   . Parkinsonism Brother   . Cancer Brother        prostate cancer     Social History   Substance and Sexual Activity  Drug Use No  ,  Social History   Substance and Sexual Activity  Alcohol Use No   Comment: none now  ,  Social History   Tobacco Use  Smoking Status Former Smoker  . Last attempt to quit: 09/23/1965  . Years since quitting: 52.4  Smokeless Tobacco Never Used  ,    Current Outpatient Medications on File Prior to Visit  Medication Sig Dispense Refill  . Ascorbic Acid (VITAMIN C) 1000 MG tablet Take 1,000 mg by mouth daily.    Marland Kitchen aspirin 81 MG tablet Take 81 mg by mouth daily.    . Cholecalciferol (VITAMIN D3) 5000 units CAPS Take 1 capsule by mouth daily.     . hydrochlorothiazide (HYDRODIURIL) 12.5 MG tablet Take 1 tablet (12.5 mg total) by mouth daily. 90 tablet 1  . levothyroxine (SYNTHROID, LEVOTHROID) 25 MCG tablet Take 1 tablet (25 mcg total) by mouth daily. 90 tablet 3  . metoprolol succinate (TOPROL-XL) 50 MG 24 hr tablet take 1 tablet by mouth once daily (TAKE WITH OR IMMEDIATELY FOLLOWING A MEAL) 90 tablet 1  . Multiple Vitamins-Minerals (PRESERVISION AREDS 2) CAPS Take 1 capsule by mouth 2 (two) times daily.    . Omega-3 Fatty Acids (FISH OIL) 1000 MG CAPS Take 3 capsules (3,000 mg total) by mouth daily.    . Vitamin D, Ergocalciferol, (DRISDOL) 50000 units CAPS capsule 1 tablet every Wednesday and every Sunday 24 capsule 10   No current facility-administered medications on file prior to visit.      No Known Allergies   Review of Systems:   General:  Denies fever,  chills Optho/Auditory:   Denies visual changes, blurred vision Respiratory:   Denies SOB, cough, wheeze, DIB  Cardiovascular:   Denies chest pain, palpitations, painful respirations Gastrointestinal:   Denies nausea, vomiting, diarrhea.  Endocrine:     Denies new hot or cold intolerance Musculoskeletal:  Denies joint swelling, gait issues, or new unexplained myalgias/ arthralgias Skin:  Denies rash, suspicious lesions  Neurological:    Denies dizziness, unexplained weakness, numbness  Psychiatric/Behavioral:   Denies mood changes  Objective:    Blood pressure 133/81, pulse 60, height 5\' 10"  (1.778 m), weight 185 lb (83.9 kg), SpO2 97 %.  Body mass index is 26.54 kg/m.  General: Well Developed, well nourished, and in no acute distress.  HEENT: Normocephalic, atraumatic, pupils equal round reactive to light, neck supple, No carotid bruits, no JVD Skin: Warm and dry, cap RF less 2 sec Cardiac: Regular rate and rhythm, S1, S2 WNL's, no murmurs rubs or gallops Respiratory: ECTA B/L, Not using accessory muscles, speaking in full sentences. NeuroM-Sk: Ambulates w/o assistance, moves ext * 4 w/o difficulty, sensation grossly intact.  Ext: scant edema b/l lower ext Psych: No HI/SI, judgement and insight good, Euthymic mood. Full Affect.

## 2018-02-27 NOTE — Patient Instructions (Addendum)
Per last office visit note: Follow-up 4 months- will reck Vit D, B12.   Melissa please just order these labs and no additional ones

## 2018-02-28 LAB — VITAMIN D 25 HYDROXY (VIT D DEFICIENCY, FRACTURES): Vit D, 25-Hydroxy: 34.7 ng/mL (ref 30.0–100.0)

## 2018-02-28 LAB — VITAMIN B12: VITAMIN B 12: 1077 pg/mL (ref 232–1245)

## 2018-04-03 ENCOUNTER — Telehealth: Payer: Self-pay

## 2018-04-03 NOTE — Telephone Encounter (Signed)
Spoke with patients wife about Medicare AWV but was informed that the patient no longer goes [facility] so I encouraged her to contact his PCP to get this appointment scheduled

## 2018-05-01 ENCOUNTER — Other Ambulatory Visit: Payer: Self-pay

## 2018-05-01 DIAGNOSIS — I10 Essential (primary) hypertension: Secondary | ICD-10-CM

## 2018-05-01 MED ORDER — HYDROCHLOROTHIAZIDE 12.5 MG PO TABS: 12.5000 mg | ORAL_TABLET | Freq: Every day | ORAL | 1 refills | Status: DC

## 2018-05-01 NOTE — Telephone Encounter (Signed)
Refill request for HCTZ.  Reviewed chart and sent in refill per office policy. MPulliam, CMA/RT(R)

## 2018-06-04 ENCOUNTER — Other Ambulatory Visit: Payer: Self-pay

## 2018-06-04 DIAGNOSIS — I10 Essential (primary) hypertension: Secondary | ICD-10-CM

## 2018-06-04 DIAGNOSIS — R000 Tachycardia, unspecified: Secondary | ICD-10-CM

## 2018-06-04 MED ORDER — METOPROLOL SUCCINATE ER 50 MG PO TB24: ORAL_TABLET | ORAL | 1 refills | Status: DC

## 2018-06-04 NOTE — Telephone Encounter (Signed)
Pharmacy sent refill request for metoprolol.  Reviewed chart and sent in based on office policy. MPulliam, CMA/RT(R)

## 2018-08-13 ENCOUNTER — Other Ambulatory Visit: Payer: Self-pay

## 2018-08-13 ENCOUNTER — Other Ambulatory Visit (INDEPENDENT_AMBULATORY_CARE_PROVIDER_SITE_OTHER): Payer: Medicare Other

## 2018-08-13 DIAGNOSIS — E538 Deficiency of other specified B group vitamins: Secondary | ICD-10-CM | POA: Diagnosis not present

## 2018-08-13 DIAGNOSIS — E782 Mixed hyperlipidemia: Secondary | ICD-10-CM | POA: Diagnosis not present

## 2018-08-13 DIAGNOSIS — E559 Vitamin D deficiency, unspecified: Secondary | ICD-10-CM | POA: Diagnosis not present

## 2018-08-14 LAB — LIPID PANEL
Chol/HDL Ratio: 5.1 ratio — ABNORMAL HIGH (ref 0.0–5.0)
Cholesterol, Total: 197 mg/dL (ref 100–199)
HDL: 39 mg/dL — ABNORMAL LOW (ref >39)
LDL Calculated: 121 mg/dL — ABNORMAL HIGH (ref 0–99)
Triglycerides: 183 mg/dL — ABNORMAL HIGH (ref 0–149)
VLDL Cholesterol Cal: 37 mg/dL (ref 5–40)

## 2018-08-14 LAB — VITAMIN D 25 HYDROXY (VIT D DEFICIENCY, FRACTURES): Vit D, 25-Hydroxy: 41.4 ng/mL (ref 30.0–100.0)

## 2018-08-14 LAB — VITAMIN B12: Vitamin B-12: 826 pg/mL (ref 232–1245)

## 2018-08-17 ENCOUNTER — Ambulatory Visit: Payer: PRIVATE HEALTH INSURANCE | Admitting: Family Medicine

## 2018-08-17 ENCOUNTER — Ambulatory Visit: Payer: PRIVATE HEALTH INSURANCE

## 2018-11-01 ENCOUNTER — Other Ambulatory Visit: Payer: Self-pay | Admitting: Family Medicine

## 2018-11-01 DIAGNOSIS — I10 Essential (primary) hypertension: Secondary | ICD-10-CM

## 2018-12-02 ENCOUNTER — Other Ambulatory Visit: Payer: Self-pay | Admitting: Family Medicine

## 2018-12-02 DIAGNOSIS — I10 Essential (primary) hypertension: Secondary | ICD-10-CM

## 2018-12-10 ENCOUNTER — Other Ambulatory Visit: Payer: Self-pay | Admitting: Family Medicine

## 2018-12-10 DIAGNOSIS — I10 Essential (primary) hypertension: Secondary | ICD-10-CM

## 2018-12-12 ENCOUNTER — Other Ambulatory Visit: Payer: Self-pay

## 2018-12-12 DIAGNOSIS — E559 Vitamin D deficiency, unspecified: Secondary | ICD-10-CM

## 2018-12-12 MED ORDER — VITAMIN D (ERGOCALCIFEROL) 1.25 MG (50000 UNIT) PO CAPS: ORAL_CAPSULE | ORAL | 10 refills | Status: DC

## 2018-12-12 NOTE — Telephone Encounter (Signed)
Pharmacy sent refill request for Vitamin D 50k.  Reviewed chart and sent in reills. MPulliam, CMA/RT(R)

## 2018-12-13 ENCOUNTER — Other Ambulatory Visit: Payer: Self-pay | Admitting: Family Medicine

## 2018-12-13 DIAGNOSIS — I10 Essential (primary) hypertension: Secondary | ICD-10-CM

## 2018-12-13 DIAGNOSIS — R000 Tachycardia, unspecified: Secondary | ICD-10-CM

## 2018-12-14 ENCOUNTER — Other Ambulatory Visit: Payer: Self-pay

## 2018-12-25 ENCOUNTER — Other Ambulatory Visit: Payer: Self-pay | Admitting: Family Medicine

## 2018-12-25 DIAGNOSIS — I10 Essential (primary) hypertension: Secondary | ICD-10-CM

## 2018-12-28 ENCOUNTER — Other Ambulatory Visit: Payer: Self-pay | Admitting: Family Medicine

## 2018-12-28 DIAGNOSIS — I10 Essential (primary) hypertension: Secondary | ICD-10-CM

## 2018-12-31 ENCOUNTER — Encounter: Payer: Self-pay | Admitting: Family Medicine

## 2018-12-31 ENCOUNTER — Ambulatory Visit (INDEPENDENT_AMBULATORY_CARE_PROVIDER_SITE_OTHER): Payer: Medicare Other | Admitting: Family Medicine

## 2018-12-31 ENCOUNTER — Other Ambulatory Visit: Payer: Self-pay

## 2018-12-31 VITALS — BP 127/88 | HR 73 | Temp 97.2°F | Ht 70.0 in | Wt 180.0 lb

## 2018-12-31 DIAGNOSIS — F341 Dysthymic disorder: Secondary | ICD-10-CM | POA: Diagnosis not present

## 2018-12-31 DIAGNOSIS — I10 Essential (primary) hypertension: Secondary | ICD-10-CM | POA: Diagnosis not present

## 2018-12-31 DIAGNOSIS — E782 Mixed hyperlipidemia: Secondary | ICD-10-CM | POA: Diagnosis not present

## 2018-12-31 DIAGNOSIS — E786 Lipoprotein deficiency: Secondary | ICD-10-CM | POA: Diagnosis not present

## 2018-12-31 DIAGNOSIS — E781 Pure hyperglyceridemia: Secondary | ICD-10-CM | POA: Diagnosis not present

## 2018-12-31 DIAGNOSIS — R5382 Chronic fatigue, unspecified: Secondary | ICD-10-CM | POA: Diagnosis not present

## 2018-12-31 DIAGNOSIS — R5381 Other malaise: Secondary | ICD-10-CM

## 2018-12-31 DIAGNOSIS — E559 Vitamin D deficiency, unspecified: Secondary | ICD-10-CM

## 2018-12-31 MED ORDER — HYDROCHLOROTHIAZIDE 12.5 MG PO TABS: 12.5000 mg | ORAL_TABLET | Freq: Every day | ORAL | 1 refills | Status: DC

## 2018-12-31 NOTE — Progress Notes (Signed)
Telehealth office visit note for Trevor Galloway, D.O- at Primary Care at [facility]   I connected with current patient today and verified that I am speaking with the correct person using two identifiers.   . Location of the patient: Home . Location of the provider: Office Only the patient (+/- their family members at pt's discretion) and myself were participating in the encounter    - This visit type was conducted due to national recommendations for restrictions regarding the COVID-19 Pandemic (e.g. social distancing) in an effort to limit this patient's exposure and mitigate transmission in our community.  This format is felt to be most appropriate for this patient at this time.   - The patient did not have access to video technology or had technical difficulties with video requiring transitioning to audio format only. - No physical exam could be performed with this format, beyond that communicated to [place] by the patient/ family members as noted.   - Additionally my office staff/ schedulers discussed with the patient that there may be a monetary charge related to this service, depending on their medical insurance.   The patient expressed understanding, and agreed to proceed.       History of Present Illness:   States "I'm talking to you, I guess I'm great!"  Patient states he thinks he and his wife are doing well.  She states "we're staying in, isn't that something?"  Patient's wife states they're walking and trying to stay healthy every day, getting 10-15 minutes of exercise per day.  Says "we've been doing that for a long time, we really have.  Since the virus and everything, it seems like it's just easier to sit."  Says they're being very careful, and don't go out very often; even around family, they are being careful.  Wife says he has had no change in his fatigue or mood.  Wife things things are stable.  Patient says "I think so too."  Notes "I would like to get out some,  but I'm afraid to go anywhere, really."  1. HTN HPI:  -  His blood pressure has been controlled at home.  Pt is checking it at home.  States his blood pressure has been "about as good as to be expected, I guess."  Per wife says "about what it is, but his systolic is usually 014/10-30." She states "it fluctuates."  - Patient reports good compliance with blood pressure medications  - Denies medication S-E   - Smoking Status noted   - He denies new onset of: chest pain, exercise intolerance, shortness of breath, dizziness, visual changes, headache, lower extremity swelling or claudication.   Last 3 blood pressure readings in our office are as follows: BP Readings from Last 3 Encounters:  12/31/18 127/88  02/27/18 133/81  11/23/17 126/73    Filed Weights   12/31/18 1128  Weight: 180 lb (81.6 kg)    2. 85 y.o. male here for cholesterol follow-up.   - Continues to decline medication.  Per patient wife, they are trying to adopt a healthier lifestyle.   - Smoking Status noted   - He denies new onset of: chest pain, exercise intolerance, shortness of breath, dizziness, visual changes, headache, lower extremity swelling or claudication.   The cholesterol last visit was:  Lab Results  Component Value Date   CHOL 197 08/13/2018   HDL 39 (L) 08/13/2018   LDLCALC 121 (H) 08/13/2018   LDLDIRECT 115.0 09/28/2015   TRIG  183 (H) 08/13/2018   CHOLHDL 5.1 (H) 08/13/2018    Hepatic Function Latest Ref Rng & Units 11/07/2017 03/29/2016 09/28/2015  Total Protein 6.0 - 8.5 g/dL 6.7 6.7 6.8  Albumin 3.5 - 4.7 g/dL 4.3 4.1 4.1  AST 0 - 40 IU/L 17 18 19   ALT 0 - 44 IU/L 12 13 15   Alk Phosphatase 39 - 117 IU/L 72 84 72  Total Bilirubin 0.0 - 1.2 mg/dL 0.6 0.4 0.6  Bilirubin, Direct 0.0 - 0.3 mg/dL - - -    Depression screen [facility] 2/9 12/31/2018 02/27/2018 11/07/2017  Decreased Interest 0 3 3  Down, Depressed, Hopeless 0 0 0  PHQ - 2 Score 0 3 3  Altered sleeping - 0 0  Tired, decreased  energy - 3 0  Change in appetite - 0 0  Feeling bad or failure about yourself  - 0 0  Trouble concentrating - 3 0  Moving slowly or fidgety/restless - 3 0  Suicidal thoughts - 0 0  PHQ-9 Score - 12 3   No flowsheet data found.    Impression and Recommendations:    1. Essential hypertension   2. Mixed hyperlipidemia   3. Hypertriglyceridemia   4. Low level of high density lipoprotein (HDL)   5. Vitamin D deficiency   6. Chronic fatigue and malaise   7. Dysthymia      - Novel Covid -19 counseling done; all questions were answered.   - Current CDC / federal and [place] guidelines reviewed with patient  - Reminded pt of extreme importance of social distancing; wearing a mask when out in public; insensate handwashing and cleaning of surfaces, avoiding unnecessary trips for shopping and avoiding ALL but emergency appts etc. - Told patient to be prepared, not scared; and be smart for the sake of others - Patient will call with any additional concerns  - Last visit was in October of 2019. - Discussed importance of following up every six months for chronic care.  Last labs drawn 08/13/2018 were B12, cholesterol, and Vitamin D.  - Need for Medicare Wellness and full lab work in near future (September 2020).  B12 WNL last check  Hyperlipidemia, Hypertriglyceridemia, low HDL - Cholesterol last checked 08/13/2018. Triglycerides = 183, up from 163 prior. LDL = 121, down from 126 prior. HDL = 39, up from 38 prior.  - Medication recommended for management. - Statins declined at that time.  - Dietary changes such as low saturated & trans fat and low carb diets discussed with patient.  Encouraged regular exercise and physical conditioning.  - Strongly encouraged need to engage in healthier dietary habits and exercise to improve both LDL and HDL values.  Essential Hypertension - Well- controlled at home per patient's wife. - Per wife, pt has been out of his HCTZ since Saturday. -  Continue meds as prescribed.  See med list below. - Patient tolerating meds well without complication.  Denies S-E  - Discussed goal BP of 140/90 or less on a regular basis.  - Ongoing ambulatory blood pressure monitoring encouraged. - Will continue to monitor.  - Patient and wife know to look out for sx of dizziness or lightheadedness.  Vitamin D = WNL at 41. - Continue supplementation as prescribed.  See med list. - Will continue to monitor.  Chronic Fatigue - Stable at this time.  - Encouraged patient to continue to engage in physical activity as tolerated. - Strongly encouraged patient to engage in physical activity 10 minutes twice per  day.  - Will continue to monitor.  Depression - Stable at this time per both patient and pt wife.  - Reviewed the "spokes of the wheel" of mood and health management.  Stressed the importance of ongoing prudent habits, including regular exercise, appropriate sleep hygiene, healthful dietary habits, and prayer/meditation to relax.  - Continue to sit outside and enjoy activities safely with wife as tolerated.  - Will continue to monitor.  Lifestyle & Preventative Health Maintenance - Advised patient to continue working toward exercising to improve overall mental, physical, and emotional health.    - American Heart Association guidelines for healthy diet, basically Mediterranean diet, and exercise guidelines of 30 minutes 5 days per week or more discussed in detail.  - Encouraged patient to engage in daily physical activity as tolerated, especially a formal exercise routine.  Recommended that the patient eventually strive for at least 150 minutes of moderate cardiovascular activity per week according to guidelines established by the [facility].   - Healthy dietary habits encouraged, including low-carb, and high amounts of lean protein in diet.   - Patient should also consume adequate amounts of water.  - Health counseling performed.  All questions  answered.  - As part of my medical decision making, I reviewed the following data within the [place] History obtained from pt /family, CMA notes reviewed and incorporated if applicable, Labs reviewed, Radiograph/ tests reviewed if applicable and OV notes from prior OV's with me, as well as other specialists she/he has seen since seeing me last, were all reviewed and used in my medical decision making process today.   - Additionally, discussion had with patient regarding txmnt plan, and their biases/concerns about that plan were used in my medical decision making today.   - The patient agreed with the plan and demonstrated an understanding of the instructions.   No barriers to understanding were identified.   - Red flag symptoms and signs discussed in detail.  Patient expressed understanding regarding what to do in case of emergency\ urgent symptoms.  The patient was advised to call back or seek an in-person evaluation if the symptoms worsen or if the condition fails to improve as anticipated.   Return for 9-10/20- lab-only bld wrk (all but FLP, Vit D and B12) with Medicare wellness.     Meds ordered this encounter  Medications  . hydrochlorothiazide (HYDRODIURIL) 12.5 MG tablet    Sig: Take 1 tablet (12.5 mg total) by mouth daily. Needs appointment.    Dispense:  90 tablet    Refill:  1    Medications Discontinued During This Encounter  Medication Reason  . levothyroxine (SYNTHROID, LEVOTHROID) 25 MCG tablet Change in therapy  . hydrochlorothiazide (HYDRODIURIL) 12.5 MG tablet Reorder      I provided  19 minutes of non-face-to-face time during this encounter,with over 50% of the time in direct counseling on patients medical conditions/ medical concerns.  Additional time was spent with charting and coordination of care after the actual visit commenced.   Note:  This note was prepared with assistance of Dragon voice recognition software. Occasional wrong-word or  sound-a-like substitutions may have occurred due to the inherent limitations of voice recognition software.  I have reviewed the above medical documentation for accuracy and completeness and I concur.  Trevor Dance, DO 01/02/2019 7:18 PM  This document serves as a record of services personally performed by Trevor Dance, DO. It was created on her behalf by Toni Amend, a trained medical scribe. The creation  of this record is based on the scribe's personal observations and the provider's statements to them.      Patient Care Team    Relationship Specialty Notifications Start End  Trevor Dance, DO PCP - General Family Medicine  03/29/16   Rana Snare, MD Consulting Physician Urology  03/30/15    Comment: does not follow regularly with this doc  Sherlynn Stalls, MD Consulting Physician Ophthalmology  03/30/15    Comment: does not follow regularly with this doc  Druscilla Brownie, MD Consulting Physician Dermatology  01/01/16   Dorna Leitz, MD Consulting Physician Orthopedic Surgery  09/26/16   Tat, Eustace Quail, DO Consulting Physician Neurology  09/26/16    Comment: does not follow regularly with this doc  Thompson Grayer, MD Consulting Physician Cardiology  09/26/16    Comment: does not follow regularly with this doc  Ceasar Mons, MD Consulting Physician Urology  07/10/17      -Vitals obtained; medications/ allergies reconciled;  personal medical, social, Sx etc.histories were updated by CMA, reviewed by me and are reflected in chart   Patient Active Problem List   Diagnosis Date Noted  . History of cardiac radiofrequency ablation 09/26/2016    Priority: High  . Diastolic dysfunction- echo shows L grade I diastolic ventricular dysfunction 01/01/2016    Priority: High  . Essential hypertension 11/28/2012    Priority: High  . Mixed hyperlipidemia 07/05/2010    Priority: High  . Chronic venous stasis dermatitis of both lower extremities 01/01/2016     Priority: Medium  . Glucose intolerance (impaired glucose tolerance) 01/01/2016    Priority: Medium  . Chronic fatigue and malaise 04/09/2013    Priority: Medium  . h/o Depression- not current 11/28/2012    Priority: Medium  . chronic B/L Peripheral Edema 11/13/2012    Priority: Medium  . Vitamin D deficiency 04/19/2016    Priority: Low  . h/o Shuffling gait due to OA knees/body 09/16/2013    Priority: Low  . Dehydration 05/18/2012    Priority: Low  . History of non anemic vitamin B12 deficiency 11/23/2017  . Cancer ([place]) 07/10/2017  . Elevated TSH 01/31/2017  . Fatigue 01/31/2017  . Hypertriglyceridemia 01/31/2017  . Low level of high density lipoprotein (HDL) 01/31/2017  . Elevated LDL cholesterol level 01/31/2017  . Impaired memory 09/26/2016  . Vision problems 01/01/2016  . LVH (left ventricular hypertrophy) 01/01/2016  . H/O prostate cancer 10/04/2015  . Frequent falls 06/17/2013  . Allergic rhinitis 08/15/2012  . General medical examination 01/03/2011  . HEMATOCHEZIA 07/05/2010  . RIGHT BUNDLE BRANCH BLOCK 12/25/2009  . TACHYCARDIA 12/25/2009  . Atrial flutter ([place]) 12/24/2009     Current Meds  Medication Sig  . Ascorbic Acid (VITAMIN C) 1000 MG tablet Take 1,000 mg by mouth daily.  Marland Kitchen aspirin 81 MG tablet Take 81 mg by mouth daily.  . hydrochlorothiazide (HYDRODIURIL) 12.5 MG tablet Take 1 tablet (12.5 mg total) by mouth daily. Needs appointment.  . metoprolol succinate (TOPROL-XL) 50 MG 24 hr tablet TAKE 1 TABLET BY MOUTH EVERY DAY(TAKE WITH OR IMMEDIATELY FOLLOWING A MEAL)  Needs Office visit for further refills  . Multiple Vitamins-Minerals (PRESERVISION AREDS 2) CAPS Take 1 capsule by mouth 2 (two) times daily.  . Omega-3 Fatty Acids (FISH OIL) 1000 MG CAPS Take 3 capsules (3,000 mg total) by mouth daily.  . Vitamin D, Ergocalciferol, (DRISDOL) 1.25 MG (50000 UT) CAPS capsule 1 tablet every Wednesday and every Sunday  . [DISCONTINUED] hydrochlorothiazide  (HYDRODIURIL) 12.5 MG tablet  Take 1 tablet (12.5 mg total) by mouth daily. Needs appointment.     Allergies:  No Known Allergies   ROS:  See above HPI for pertinent positives and negatives   Objective:   Blood pressure 127/88, pulse 73, temperature (!) 97.2 F (36.2 C), temperature source Oral, height 5' 10"  (1.778 m), weight 180 lb (81.6 kg).  (if some vitals are omitted, this means that patient was UNABLE to obtain them even though they were asked to get them prior to OV today.  They were asked to call us at their earliest convenience with these once obtained. )  General: A & O * 3; sounds in no acute distress; in usual state of health.  Skin: Pt confirms warm and dry extremities and pink fingertips HEENT: Pt confirms lips non-cyanotic Chest: Patient confirms normal chest excursion and movement Respiratory: speaking in full sentences, no conversational dyspnea; patient confirms no use of accessory muscles Psych: insight appears good, mood- appears full

## 2019-01-14 ENCOUNTER — Other Ambulatory Visit: Payer: Self-pay | Admitting: Family Medicine

## 2019-01-14 DIAGNOSIS — I10 Essential (primary) hypertension: Secondary | ICD-10-CM

## 2019-01-14 DIAGNOSIS — R000 Tachycardia, unspecified: Secondary | ICD-10-CM

## 2019-02-19 ENCOUNTER — Other Ambulatory Visit: Payer: Self-pay | Admitting: Family Medicine

## 2019-02-19 NOTE — Telephone Encounter (Signed)
Pt's daughter /Ms. Gonet called states she wishes to have her parent Medication refills to Coordinate with each other  & be filled for a (90day supply )so she won't have to run to the pharmacy every (30days).  metoprolol succinate (TOPROL-XL) 50 MG 24 hr tablet [id number]   Order Details Dose, Route, Frequency: As Directed  Dispense Quantity: 90 tablet Refills: 1 Fills remaining: --        Sig: TAKE 1 TABLET BY MOUTH EVERY DAY(TAKE WITH OR IMMEDIATELY FOLLOWING A MEAL)       --Forwarding refill request to medical assistant    Patient uses :   [facility] [phone] - [place], [address] AT [facility] [address] [place] [phone] (Phone) [phone] (Fax)   --glh

## 2019-02-19 NOTE — Telephone Encounter (Signed)
Spoke with patients son in law and addressed medication concerns.  He is going to work with the pharmacy to get medications on a schedule.

## 2019-03-06 ENCOUNTER — Ambulatory Visit (INDEPENDENT_AMBULATORY_CARE_PROVIDER_SITE_OTHER): Payer: Medicare Other

## 2019-03-06 ENCOUNTER — Other Ambulatory Visit: Payer: Self-pay

## 2019-03-06 DIAGNOSIS — E782 Mixed hyperlipidemia: Secondary | ICD-10-CM | POA: Diagnosis not present

## 2019-03-06 DIAGNOSIS — Z8639 Personal history of other endocrine, nutritional and metabolic disease: Secondary | ICD-10-CM | POA: Diagnosis not present

## 2019-03-06 DIAGNOSIS — R5382 Chronic fatigue, unspecified: Secondary | ICD-10-CM

## 2019-03-06 DIAGNOSIS — R7989 Other specified abnormal findings of blood chemistry: Secondary | ICD-10-CM | POA: Diagnosis not present

## 2019-03-06 DIAGNOSIS — R5381 Other malaise: Secondary | ICD-10-CM | POA: Diagnosis not present

## 2019-03-06 DIAGNOSIS — R7302 Impaired glucose tolerance (oral): Secondary | ICD-10-CM | POA: Diagnosis not present

## 2019-03-06 DIAGNOSIS — E781 Pure hyperglyceridemia: Secondary | ICD-10-CM

## 2019-03-06 DIAGNOSIS — Z23 Encounter for immunization: Secondary | ICD-10-CM | POA: Diagnosis not present

## 2019-03-06 DIAGNOSIS — I10 Essential (primary) hypertension: Secondary | ICD-10-CM | POA: Diagnosis not present

## 2019-03-07 LAB — CBC WITH DIFFERENTIAL/PLATELET
Basophils Absolute: 0.1 10*3/uL (ref 0.0–0.2)
Basos: 1 %
EOS (ABSOLUTE): 0.1 10*3/uL (ref 0.0–0.4)
Eos: 2 %
Hematocrit: 41.7 % (ref 37.5–51.0)
Hemoglobin: 14.6 g/dL (ref 13.0–17.7)
Immature Grans (Abs): 0.1 10*3/uL (ref 0.0–0.1)
Immature Granulocytes: 1 %
Lymphocytes Absolute: 1.7 10*3/uL (ref 0.7–3.1)
Lymphs: 24 %
MCH: 32.6 pg (ref 26.6–33.0)
MCHC: 35 g/dL (ref 31.5–35.7)
MCV: 93 fL (ref 79–97)
Monocytes Absolute: 1 10*3/uL — ABNORMAL HIGH (ref 0.1–0.9)
Monocytes: 14 %
Neutrophils Absolute: 4.1 10*3/uL (ref 1.4–7.0)
Neutrophils: 58 %
Platelets: 189 10*3/uL (ref 150–450)
RBC: 4.48 x10E6/uL (ref 4.14–5.80)
RDW: 13.7 % (ref 11.6–15.4)
WBC: 7 10*3/uL (ref 3.4–10.8)

## 2019-03-07 LAB — COMPREHENSIVE METABOLIC PANEL
ALT: 14 IU/L (ref 0–44)
AST: 18 IU/L (ref 0–40)
Albumin/Globulin Ratio: 1.3 (ref 1.2–2.2)
Albumin: 3.8 g/dL (ref 3.5–4.6)
Alkaline Phosphatase: 85 IU/L (ref 39–117)
BUN/Creatinine Ratio: 22 (ref 10–24)
BUN: 19 mg/dL (ref 10–36)
Bilirubin Total: 0.5 mg/dL (ref 0.0–1.2)
CO2: 24 mmol/L (ref 20–29)
Calcium: 9.9 mg/dL (ref 8.6–10.2)
Chloride: 102 mmol/L (ref 96–106)
Creatinine, Ser: 0.86 mg/dL (ref 0.76–1.27)
GFR calc Af Amer: 88 mL/min/{1.73_m2} (ref >59)
GFR calc non Af Amer: 76 mL/min/{1.73_m2} (ref >59)
Globulin, Total: 2.9 g/dL (ref 1.5–4.5)
Glucose: 90 mg/dL (ref 65–99)
Potassium: 4.4 mmol/L (ref 3.5–5.2)
Sodium: 139 mmol/L (ref 134–144)
Total Protein: 6.7 g/dL (ref 6.0–8.5)

## 2019-03-07 LAB — HEMOGLOBIN A1C
Est. average glucose Bld gHb Est-mCnc: 114 mg/dL
Hgb A1c MFr Bld: 5.6 % (ref 4.8–5.6)

## 2019-03-07 LAB — TSH: TSH: 6.92 u[IU]/mL — ABNORMAL HIGH (ref 0.450–4.500)

## 2019-03-07 LAB — T3: T3, Total: 113 ng/dL (ref 71–180)

## 2019-03-07 LAB — T4, FREE: Free T4: 1.27 ng/dL (ref 0.82–1.77)

## 2019-04-03 ENCOUNTER — Telehealth: Payer: Self-pay | Admitting: Family Medicine

## 2019-04-03 NOTE — Telephone Encounter (Signed)
Patient's daughter (DPR on file) is requesting to speak with clinic staff who talked to her mom about the patient this morning about being seen in office. They are not able to get to a COVID testing site and are wishing to be assessed by PCP. She cn be reached at [phone]

## 2019-04-03 NOTE — Telephone Encounter (Signed)
Patient wife states that "patient isnt feeling well today" she said she tried to take his temp with a thermometer they have at home but it wouldn't read but he doesn't feel warm to the touch. I asked her several different symptoms and the only ones that patient spouse said he was positive for were body aches, bilateral arm pain and loss of taste. I advised [name] that patient needed to be COVID tested at [facility]. [name] stated that she would try to get patient there. She denies any difficulty breathing or mental status change for patient and just said "he doesn't feel well but hes okay". AS, CMA

## 2019-04-03 NOTE — Telephone Encounter (Signed)
Patient's daughter (DPR on file) is requesting to speak with clinic staff who talked to her mom about the patient this morning about being seen in office. They are not able to get to a COVID testing site and are wishing to be assessed by PCP. She cn be reached at [phone]  I have attempted to contact patient daughter with no answer. Left message for her to call back.   Pt wife, [name], called back requesting that patient be seen in office today or tomorrow because he feels badly. I advised her that we were not bringing anyone into our office with COVID symptoms and body aches and loss of taste are COVID symptoms. [name] verbalized understanding but stated she did not have a way to get patient COVID tested. I advised her that her daughter, Arbie Cookey has called our office and that I would try to speak with her and see if we can get patient assistance. AS, CMA

## 2019-04-03 NOTE — Telephone Encounter (Signed)
Please see other telephone message concerning this issue for patient.  I have attempted to contact patient daughter at number below. Adding this message to other message. AS, CMA

## 2019-04-03 NOTE — Telephone Encounter (Signed)
Patient's wife is wishing to speak with clinic staff about some concerns about she has about her husband. She wanted to speak with Dr. Jenetta Downer but advised she was only here for a half day today and is booked until lunch. Routing to CMA pool to see what concerns she has and what we should do.

## 2019-04-03 NOTE — Telephone Encounter (Signed)
Good.  That is perfect.  That is exactly what he needs-  to go for Covid test.  Thank you very much.

## 2019-04-04 NOTE — Telephone Encounter (Signed)
I attempted to call patient daughter again with no answer. Left message for her to call back.  Called patient wife, Trevor Galloway to check on patient- Trevor Galloway said that patient was feeling much better and back to his normal self. She said she did not take patient to get tested for COVID. AS, CMA

## 2019-04-25 ENCOUNTER — Ambulatory Visit (INDEPENDENT_AMBULATORY_CARE_PROVIDER_SITE_OTHER): Payer: Medicare Other | Admitting: Family Medicine

## 2019-04-25 ENCOUNTER — Other Ambulatory Visit: Payer: Self-pay

## 2019-04-25 ENCOUNTER — Encounter: Payer: Self-pay | Admitting: Family Medicine

## 2019-04-25 VITALS — BP 117/65 | Temp 97.0°F | Wt 180.0 lb

## 2019-04-25 DIAGNOSIS — Z9181 History of falling: Secondary | ICD-10-CM

## 2019-04-25 DIAGNOSIS — Z23 Encounter for immunization: Secondary | ICD-10-CM | POA: Diagnosis not present

## 2019-04-25 DIAGNOSIS — Z7409 Other reduced mobility: Secondary | ICD-10-CM

## 2019-04-25 DIAGNOSIS — R413 Other amnesia: Secondary | ICD-10-CM | POA: Diagnosis not present

## 2019-04-25 DIAGNOSIS — R262 Difficulty in walking, not elsewhere classified: Secondary | ICD-10-CM | POA: Diagnosis not present

## 2019-04-25 DIAGNOSIS — Z0000 Encounter for general adult medical examination without abnormal findings: Secondary | ICD-10-CM | POA: Diagnosis not present

## 2019-04-25 MED ORDER — SHINGRIX 50 MCG/0.5ML IM SUSR: 0.5000 mL | Freq: Once | INTRAMUSCULAR | 0 refills | Status: AC

## 2019-04-25 MED ORDER — PNEUMOCOCCAL VAC POLYVALENT 25 MCG/0.5ML IJ INJ: 0.5000 mL | INJECTION | INTRAMUSCULAR | 0 refills | Status: AC
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
## 2019-04-28 DIAGNOSIS — R262 Difficulty in walking, not elsewhere classified: Secondary | ICD-10-CM | POA: Insufficient documentation

## 2019-04-28 DIAGNOSIS — Z7409 Other reduced mobility: Secondary | ICD-10-CM | POA: Insufficient documentation

## 2019-04-28 DIAGNOSIS — Z9181 History of falling: Secondary | ICD-10-CM | POA: Insufficient documentation

## 2019-06-20 ENCOUNTER — Ambulatory Visit: Payer: PRIVATE HEALTH INSURANCE | Admitting: Neurology

## 2019-07-11 ENCOUNTER — Telehealth: Payer: Self-pay | Admitting: Family Medicine

## 2019-07-11 DIAGNOSIS — I10 Essential (primary) hypertension: Secondary | ICD-10-CM

## 2019-07-11 MED ORDER — HYDROCHLOROTHIAZIDE 12.5 MG PO TABS: 12.5000 mg | ORAL_TABLET | Freq: Every day | ORAL | 0 refills | Status: DC

## 2019-07-11 NOTE — Addendum Note (Signed)
Addended by: Fonnie Mu on: 07/11/2019 01:26 PM   Modules accepted: Orders

## 2019-07-11 NOTE — Telephone Encounter (Signed)
Patient is requesting a refill of his hydrochlorothiazide, if approved please send to [facility]

## 2019-07-14 DIAGNOSIS — Z23 Encounter for immunization: Secondary | ICD-10-CM | POA: Diagnosis not present

## 2019-07-22 ENCOUNTER — Telehealth: Payer: Self-pay | Admitting: Family Medicine

## 2019-07-22 DIAGNOSIS — I10 Essential (primary) hypertension: Secondary | ICD-10-CM

## 2019-07-22 DIAGNOSIS — R000 Tachycardia, unspecified: Secondary | ICD-10-CM

## 2019-07-22 MED ORDER — METOPROLOL SUCCINATE ER 50 MG PO TB24: ORAL_TABLET | ORAL | 0 refills | Status: DC

## 2019-07-22 NOTE — Telephone Encounter (Signed)
Patient is requesting a refill of his metoprolol, if approved please send to [facility] on [address] 

## 2019-07-22 NOTE — Telephone Encounter (Signed)
Patient is aware med refill sent to pharmacy. AS, CMA

## 2019-07-22 NOTE — Addendum Note (Signed)
Addended by: Mickel Crow on: 07/22/2019 10:31 AM   Modules accepted: Orders

## 2019-08-09 DIAGNOSIS — Z23 Encounter for immunization: Secondary | ICD-10-CM | POA: Diagnosis not present

## 2019-10-05 ENCOUNTER — Other Ambulatory Visit: Payer: Self-pay | Admitting: Family Medicine

## 2019-10-05 DIAGNOSIS — I10 Essential (primary) hypertension: Secondary | ICD-10-CM

## 2019-10-05 DIAGNOSIS — R000 Tachycardia, unspecified: Secondary | ICD-10-CM

## 2019-10-07 ENCOUNTER — Telehealth: Payer: Self-pay | Admitting: Physician Assistant

## 2019-10-07 ENCOUNTER — Other Ambulatory Visit: Payer: Self-pay | Admitting: Physician Assistant

## 2019-10-07 ENCOUNTER — Other Ambulatory Visit: Payer: Self-pay | Admitting: Family Medicine

## 2019-10-07 DIAGNOSIS — I10 Essential (primary) hypertension: Secondary | ICD-10-CM

## 2019-10-07 MED ORDER — HYDROCHLOROTHIAZIDE 12.5 MG PO TABS: 12.5000 mg | ORAL_TABLET | Freq: Every day | ORAL | 0 refills | Status: DC

## 2019-10-07 NOTE — Telephone Encounter (Signed)
Please call pt to schedule appt.  No further refills until pt is seen.  T. Devlin Brink, CMA  

## 2019-10-07 NOTE — Addendum Note (Signed)
Addended by: Fonnie Mu on: 10/07/2019 12:00 PM   Modules accepted: Orders

## 2019-10-07 NOTE — Telephone Encounter (Signed)
Patient is requesting a refill of his hydrochlorothiazide, if approved please send to [facility] on [address] and [facility]

## 2019-10-25 ENCOUNTER — Telehealth: Payer: Self-pay | Admitting: Physician Assistant

## 2019-10-25 DIAGNOSIS — R000 Tachycardia, unspecified: Secondary | ICD-10-CM

## 2019-10-25 DIAGNOSIS — I10 Essential (primary) hypertension: Secondary | ICD-10-CM

## 2019-10-25 MED ORDER — METOPROLOL SUCCINATE ER 50 MG PO TB24: ORAL_TABLET | ORAL | 0 refills | Status: DC

## 2019-10-25 NOTE — Telephone Encounter (Signed)
Patient is requesting a refill of his metoprolol, if approved please send to [facility] on [address] and [facility]

## 2019-10-25 NOTE — Addendum Note (Signed)
Addended by: Fonnie Mu on: 10/25/2019 11:58 AM   Modules accepted: Orders

## 2019-11-04 ENCOUNTER — Telehealth (INDEPENDENT_AMBULATORY_CARE_PROVIDER_SITE_OTHER): Payer: Medicare Other | Admitting: Physician Assistant

## 2019-11-04 ENCOUNTER — Encounter: Payer: Self-pay | Admitting: Physician Assistant

## 2019-11-04 ENCOUNTER — Other Ambulatory Visit: Payer: Self-pay | Admitting: Physician Assistant

## 2019-11-04 ENCOUNTER — Telehealth: Payer: Self-pay | Admitting: Physician Assistant

## 2019-11-04 ENCOUNTER — Other Ambulatory Visit: Payer: Self-pay

## 2019-11-04 VITALS — BP 128/69 | Temp 98.6°F | Wt 185.0 lb

## 2019-11-04 DIAGNOSIS — E781 Pure hyperglyceridemia: Secondary | ICD-10-CM

## 2019-11-04 DIAGNOSIS — I10 Essential (primary) hypertension: Secondary | ICD-10-CM | POA: Diagnosis not present

## 2019-11-04 DIAGNOSIS — E7800 Pure hypercholesterolemia, unspecified: Secondary | ICD-10-CM

## 2019-11-04 MED ORDER — HYDROCHLOROTHIAZIDE 12.5 MG PO TABS: ORAL_TABLET | ORAL | 1 refills | Status: DC

## 2019-11-04 NOTE — Telephone Encounter (Signed)
Please call patient and schedule apt for medication refills. AS,CMA

## 2019-11-04 NOTE — Assessment & Plan Note (Signed)
-   Last lipid panel: triglycerides and LDL elevated, HDL low. - Continue fish oil. - Follow heart healthy diet. - Pt due for lipid recheck so advised to come in for FBW in the upcoming weeks.

## 2019-11-04 NOTE — Assessment & Plan Note (Signed)
>>  ASSESSMENT AND PLAN FOR HYPERTRIGLYCERIDEMIA WRITTEN ON 11/04/2019  4:01 PM BY ABONZA, MARITZA, PA-C  - Last lipid panel: triglycerides and LDL elevated, HDL low. - Continue fish oil. - Follow heart healthy diet. - Pt due for lipid recheck so advised to come in for FBW in the upcoming weeks.

## 2019-11-04 NOTE — Assessment & Plan Note (Signed)
-   BP today is 128/69, at goal. - Continue HCTZ and Metoprolol  - Continue ambulatory BP and pulse monitoring, notify the office if BP consistently >140/90 or < 90/60. - Follow DASH diet. - Encourage to stay as active as possible.

## 2019-11-04 NOTE — Progress Notes (Signed)
Telehealth office visit note for Lorrene Reid, PA-C- at Primary Care at [facility]   I connected with current patient today by telephone and verified that I am speaking with the correct person   . Location of the patient: Home . Location of the provider: Office - This visit type was conducted due to national recommendations for restrictions regarding the COVID-19 Pandemic (e.g. social distancing) in an effort to limit this patient's exposure and mitigate transmission in our community.    - No physical exam could be performed with this format, beyond that communicated to [place] by the patient/ family members as noted.   - Additionally my office staff/ schedulers were to discuss with the patient that there may be a monetary charge related to this service, depending on their medical insurance.  My understanding is that patient understood and consented to proceed.     _________________________________________________________________________________   History of Present Illness: Pt calls in to follow-up on hypertension, hyperlipidemia and hypertriglyceridemia. Pt has no acute concerns or questions today. Requesting medication refill.    HTN: Pt denies chest pain, palpitations,or dizziness. Taking medication as directed without side effects. Checks BP at home occasionally. Pt has chronic venous stasis. Requesting refill of HCTZ.   HLD, hypertriglyceridemia: Pt taking fish oil as directed without issues. Tries to follow heart healthy diet.   No flowsheet data found.  Depression screen [facility] 2/9 11/04/2019 04/25/2019 12/31/2018 02/27/2018 11/07/2017  Decreased Interest 2 0 0 3 3  Down, Depressed, Hopeless 0 0 0 0 0  PHQ - 2 Score 2 0 0 3 3  Altered sleeping 3 1 - 0 0  Tired, decreased energy 0 1 - 3 0  Change in appetite 0 0 - 0 0  Feeling bad or failure about yourself  0 0 - 0 0  Trouble concentrating 0 0 - 3 0  Moving slowly or fidgety/restless 0 0 - 3 0  Suicidal thoughts 0 0 - 0 0  PHQ-9  Score 5 2 - 12 3  Difficult doing work/chores Not difficult at all Not difficult at all - - -      Impression and Recommendations:     1. Essential hypertension   2. Hypertriglyceridemia   3. Elevated LDL cholesterol level     HTN: - BP today is 128/69, at goal. - Continue HCTZ 12.5 mg and metoprolol 50 mg.  Provided refills of HCTZ. - Continue ambulatory BP and pulse monitoring. Notify the clinic if BP consistently above 140/90 or <90/60.  - Follow DASH diet. - Stay well hydrated, at least 64 fl oz  Hypertriglycerdiemia, Elevated LDL cholesterol level: - Last lipid panel: Triglycerides and LDL elevated, HDL borderline low - Continue fish oil. - Follow heart healthy diet and stay as active as possible.  - Advised patient to schedule lab visit for fasting blood work in the next few weeks to recheck lipid panel.    - As part of my medical decision making, I reviewed the following data within the [place] History obtained from pt /family, CMA notes reviewed and incorporated if applicable, Labs reviewed, Radiograph/ tests reviewed if applicable and OV notes from prior OV's with me, as well as any other specialists she/he has seen since seeing me last, were all reviewed and used in my medical decision making process today.    - Additionally, when appropriate, discussion had with patient regarding our treatment plan, and their biases/concerns about that plan were used in my medical decision making  today.    - The patient agreed with the plan and demonstrated an understanding of the instructions.   No barriers to understanding were identified.     - The patient was advised to call back or seek an in-person evaluation if the symptoms worsen or if the condition fails to improve as anticipated.   Return in about 4 months (around 03/05/2020) for HTN, HLD, Vit D def; FBW and vit D in 1-4 weeks .    No orders of the defined types were placed in this encounter.   Meds  ordered this encounter  Medications  . hydrochlorothiazide (HYDRODIURIL) 12.5 MG tablet    Sig: TAKE 1 TABLET BY MOUTH EVERY DAY    Dispense:  90 tablet    Refill:  1    Order Specific Question:   Supervising Provider    Answer:   Beatrice Lecher D [[id number]]    Medications Discontinued During This Encounter  Medication Reason  . Cholecalciferol (VITAMIN D3) 5000 units CAPS   . hydrochlorothiazide (HYDRODIURIL) 12.5 MG tablet Reorder       Time spent on visit including pre-visit chart review and post-visit care was 8 minutes.      The [address] was signed into law in 2016 which includes the topic of electronic health records.  This provides immediate access to information in MyChart.  This includes consultation notes, operative notes, office notes, lab results and pathology reports.  If you have any questions about what you read please let us know at your next visit or call us at the office.  We are right here with you.   __________________________________________________________________________________     Patient Care Team    Relationship Specialty Notifications Start End  Lorrene Reid, [place] PCP - General   09/15/19   Rana Snare, MD Consulting Physician Urology  03/30/15    Comment: does not follow regularly with this doc  Sherlynn Stalls, MD Consulting Physician Ophthalmology  03/30/15    Comment: does not follow regularly with this doc  Druscilla Brownie, MD Consulting Physician Dermatology  01/01/16   Dorna Leitz, MD Consulting Physician Orthopedic Surgery  09/26/16   Tat, Eustace Quail, DO Consulting Physician Neurology  09/26/16    Comment: does not follow regularly with this doc  Thompson Grayer, MD Consulting Physician Cardiology  09/26/16    Comment: does not follow regularly with this doc  Ceasar Mons, MD Consulting Physician Urology  07/10/17      -Vitals obtained; medications/ allergies reconciled;  personal medical, social, Sx  etc.histories were updated by CMA, reviewed by me and are reflected in chart   Patient Active Problem List   Diagnosis Date Noted  . Decreased transfer ability 04/28/2019  . Impaired ambulation 04/28/2019  . At high risk for falls 04/28/2019  . History of non anemic vitamin B12 deficiency 11/23/2017  . Cancer ([place]) 07/10/2017  . Elevated TSH 01/31/2017  . Fatigue 01/31/2017  . Hypertriglyceridemia 01/31/2017  . Low level of high density lipoprotein (HDL) 01/31/2017  . Elevated LDL cholesterol level 01/31/2017  . History of cardiac radiofrequency ablation 09/26/2016  . Impaired memory 09/26/2016  . Vitamin D deficiency 04/19/2016  . Vision problems 01/01/2016  . Chronic venous stasis dermatitis of both lower extremities 01/01/2016  . Glucose intolerance (impaired glucose tolerance) 01/01/2016  . LVH (left ventricular hypertrophy) 01/01/2016  . Diastolic dysfunction- echo shows L grade I diastolic ventricular dysfunction 01/01/2016  . H/O prostate cancer 10/04/2015  . h/o Shuffling gait due to  OA knees/body 09/16/2013  . Frequent falls 06/17/2013  . Chronic fatigue and malaise 04/09/2013  . Essential hypertension 11/28/2012  . h/o Depression- not current 11/28/2012  . chronic B/L Peripheral Edema 11/13/2012  . Allergic rhinitis 08/15/2012  . Dehydration 05/18/2012  . General medical examination 01/03/2011  . Mixed hyperlipidemia 07/05/2010  . HEMATOCHEZIA 07/05/2010  . RIGHT BUNDLE BRANCH BLOCK 12/25/2009  . TACHYCARDIA 12/25/2009  . Atrial flutter ([place]) 12/24/2009     Current Meds  Medication Sig  . Ascorbic Acid (VITAMIN C) 1000 MG tablet Take 1,000 mg by mouth daily.  Marland Kitchen aspirin 81 MG tablet Take 81 mg by mouth daily.  . hydrochlorothiazide (HYDRODIURIL) 12.5 MG tablet TAKE 1 TABLET BY MOUTH EVERY DAY  . metoprolol succinate (TOPROL-XL) 50 MG 24 hr tablet TAKE 1 TABLET BY MOUTH EVERY DAY(TAKE WITH OR IMMEDIATELY FOLLOWING A MEAL)  . Multiple Vitamins-Minerals  (PRESERVISION AREDS 2) CAPS Take 1 capsule by mouth 2 (two) times daily.  . Omega-3 Fatty Acids (FISH OIL) 1000 MG CAPS Take 3 capsules (3,000 mg total) by mouth daily.  . Vitamin D, Ergocalciferol, (DRISDOL) 1.25 MG (50000 UT) CAPS capsule 1 tablet every Wednesday and every Sunday  . [DISCONTINUED] hydrochlorothiazide (HYDRODIURIL) 12.5 MG tablet TAKE 1 TABLET BY MOUTH EVERY DAY(OFFICE VISIT REQUIRED PRIOR TO ANY FURTHER REFILLS)     Allergies:  No Known Allergies   ROS:  See above HPI for pertinent positives and negatives   Objective:   Blood pressure 128/69, temperature 98.6 F (37 C), temperature source Oral, weight 185 lb (83.9 kg).  (if some vitals are omitted, this means that patient was UNABLE to obtain them even though they were asked to get them prior to OV today.  They were asked to call us at their earliest convenience with these once obtained. ) General: A & O * 3; sounds in no acute distress; in usual state of health.  Respiratory: speaking in full sentences, no conversational dyspnea Psych: insight appears good, mood- appears full

## 2019-11-11 ENCOUNTER — Other Ambulatory Visit: Payer: Self-pay | Admitting: Physician Assistant

## 2019-11-11 ENCOUNTER — Other Ambulatory Visit: Payer: Medicare Other

## 2019-11-11 ENCOUNTER — Other Ambulatory Visit: Payer: Self-pay

## 2019-11-11 DIAGNOSIS — R5383 Other fatigue: Secondary | ICD-10-CM

## 2019-11-11 DIAGNOSIS — E559 Vitamin D deficiency, unspecified: Secondary | ICD-10-CM

## 2019-11-11 DIAGNOSIS — E781 Pure hyperglyceridemia: Secondary | ICD-10-CM

## 2019-11-11 DIAGNOSIS — E7800 Pure hypercholesterolemia, unspecified: Secondary | ICD-10-CM

## 2019-11-11 DIAGNOSIS — I10 Essential (primary) hypertension: Secondary | ICD-10-CM

## 2019-11-11 DIAGNOSIS — R7989 Other specified abnormal findings of blood chemistry: Secondary | ICD-10-CM | POA: Diagnosis not present

## 2019-11-11 DIAGNOSIS — E782 Mixed hyperlipidemia: Secondary | ICD-10-CM

## 2019-11-11 NOTE — Progress Notes (Signed)
Return in about 4 months (around 03/05/2020) for HTN, HLD, Vit D def; FBW and vit D in 1-4 weeks .

## 2019-11-12 LAB — COMPREHENSIVE METABOLIC PANEL
ALT: 14 IU/L (ref 0–44)
AST: 16 IU/L (ref 0–40)
Albumin/Globulin Ratio: 2 (ref 1.2–2.2)
Albumin: 4.1 g/dL (ref 3.5–4.6)
Alkaline Phosphatase: 82 IU/L (ref 48–121)
BUN/Creatinine Ratio: 21 (ref 10–24)
BUN: 21 mg/dL (ref 10–36)
Bilirubin Total: 0.6 mg/dL (ref 0.0–1.2)
CO2: 23 mmol/L (ref 20–29)
Calcium: 9.8 mg/dL (ref 8.6–10.2)
Chloride: 99 mmol/L (ref 96–106)
Creatinine, Ser: 1.01 mg/dL (ref 0.76–1.27)
GFR calc Af Amer: 75 mL/min/{1.73_m2} (ref >59)
GFR calc non Af Amer: 65 mL/min/{1.73_m2} (ref >59)
Globulin, Total: 2.1 g/dL (ref 1.5–4.5)
Glucose: 93 mg/dL (ref 65–99)
Potassium: 4.1 mmol/L (ref 3.5–5.2)
Sodium: 137 mmol/L (ref 134–144)
Total Protein: 6.2 g/dL (ref 6.0–8.5)

## 2019-11-12 LAB — LIPID PANEL
Chol/HDL Ratio: 4.4 ratio (ref 0.0–5.0)
Cholesterol, Total: 195 mg/dL (ref 100–199)
HDL: 44 mg/dL (ref >39)
LDL Chol Calc (NIH): 127 mg/dL — ABNORMAL HIGH (ref 0–99)
Triglycerides: 135 mg/dL (ref 0–149)
VLDL Cholesterol Cal: 24 mg/dL (ref 5–40)

## 2019-11-12 LAB — CBC
Hematocrit: 40.5 % (ref 37.5–51.0)
Hemoglobin: 13.8 g/dL (ref 13.0–17.7)
MCH: 31.9 pg (ref 26.6–33.0)
MCHC: 34.1 g/dL (ref 31.5–35.7)
MCV: 94 fL (ref 79–97)
Platelets: 184 10*3/uL (ref 150–450)
RBC: 4.32 x10E6/uL (ref 4.14–5.80)
RDW: 13.6 % (ref 11.6–15.4)
WBC: 7 10*3/uL (ref 3.4–10.8)

## 2019-11-12 LAB — HEMOGLOBIN A1C
Est. average glucose Bld gHb Est-mCnc: 114 mg/dL
Hgb A1c MFr Bld: 5.6 % (ref 4.8–5.6)

## 2019-11-12 LAB — TSH: TSH: 5.39 u[IU]/mL — ABNORMAL HIGH (ref 0.450–4.500)

## 2019-11-12 LAB — VITAMIN D 25 HYDROXY (VIT D DEFICIENCY, FRACTURES): Vit D, 25-Hydroxy: 39.3 ng/mL (ref 30.0–100.0)

## 2019-11-14 ENCOUNTER — Telehealth: Payer: Self-pay

## 2019-11-14 NOTE — Telephone Encounter (Signed)
-----   Message from Lorrene Reid, [place] sent at 11/14/2019  1:01 PM EDT ----- Mindi Junker,  Please call [facility] and add free T4 and T3.   Thank you, Herb Grays

## 2019-11-14 NOTE — Telephone Encounter (Signed)
Spoke with [place] at [facility] to add labs.  Charyl Bigger, CMA

## 2019-11-30 LAB — T3: T3, Total: 107 ng/dL (ref 71–180)

## 2019-11-30 LAB — SPECIMEN STATUS REPORT

## 2019-11-30 LAB — T4, FREE: Free T4: 1.18 ng/dL (ref 0.82–1.77)

## 2019-12-10 ENCOUNTER — Other Ambulatory Visit: Payer: Self-pay | Admitting: Physician Assistant

## 2019-12-10 DIAGNOSIS — I10 Essential (primary) hypertension: Secondary | ICD-10-CM

## 2020-01-01 ENCOUNTER — Other Ambulatory Visit: Payer: Self-pay | Admitting: Family Medicine

## 2020-01-01 DIAGNOSIS — E559 Vitamin D deficiency, unspecified: Secondary | ICD-10-CM

## 2020-01-03 ENCOUNTER — Other Ambulatory Visit: Payer: Self-pay | Admitting: Physician Assistant

## 2020-01-03 ENCOUNTER — Telehealth: Payer: Self-pay | Admitting: Physician Assistant

## 2020-01-03 DIAGNOSIS — R000 Tachycardia, unspecified: Secondary | ICD-10-CM

## 2020-01-03 DIAGNOSIS — I10 Essential (primary) hypertension: Secondary | ICD-10-CM

## 2020-01-03 DIAGNOSIS — E559 Vitamin D deficiency, unspecified: Secondary | ICD-10-CM

## 2020-01-03 MED ORDER — VITAMIN D (ERGOCALCIFEROL) 1.25 MG (50000 UNIT) PO CAPS: ORAL_CAPSULE | ORAL | 10 refills | Status: DC

## 2020-01-03 NOTE — Telephone Encounter (Signed)
Patient is requesting a refill of his Vit D, if approved please send to [facility] on [address].

## 2020-01-03 NOTE — Telephone Encounter (Signed)
Rx sent to requested pharmacy. AS, CMA 

## 2020-01-27 ENCOUNTER — Telehealth: Payer: Self-pay | Admitting: Physician Assistant

## 2020-01-27 DIAGNOSIS — R000 Tachycardia, unspecified: Secondary | ICD-10-CM

## 2020-01-27 DIAGNOSIS — I10 Essential (primary) hypertension: Secondary | ICD-10-CM

## 2020-01-27 MED ORDER — METOPROLOL SUCCINATE ER 50 MG PO TB24: ORAL_TABLET | ORAL | 0 refills | Status: DC

## 2020-01-27 NOTE — Addendum Note (Signed)
Addended by: Mickel Crow on: 01/27/2020 02:00 PM   Modules accepted: Orders

## 2020-01-27 NOTE — Telephone Encounter (Signed)
Patient is requesting a refill of his metoprolol, if approved please send to [facility] on [address].

## 2020-01-27 NOTE — Telephone Encounter (Signed)
Med sent to requested pharmacy. AS< CMA

## 2020-03-05 ENCOUNTER — Encounter: Payer: Self-pay | Admitting: Physician Assistant

## 2020-03-05 ENCOUNTER — Other Ambulatory Visit: Payer: Self-pay

## 2020-03-05 ENCOUNTER — Ambulatory Visit (INDEPENDENT_AMBULATORY_CARE_PROVIDER_SITE_OTHER): Payer: Medicare Other | Admitting: Physician Assistant

## 2020-03-05 VITALS — BP 125/74 | HR 70 | Temp 97.9°F | Wt 180.2 lb

## 2020-03-05 DIAGNOSIS — I10 Essential (primary) hypertension: Secondary | ICD-10-CM | POA: Diagnosis not present

## 2020-03-05 DIAGNOSIS — Z23 Encounter for immunization: Secondary | ICD-10-CM | POA: Diagnosis not present

## 2020-03-05 DIAGNOSIS — C801 Malignant (primary) neoplasm, unspecified: Secondary | ICD-10-CM | POA: Diagnosis not present

## 2020-03-05 DIAGNOSIS — E782 Mixed hyperlipidemia: Secondary | ICD-10-CM | POA: Diagnosis not present

## 2020-03-05 DIAGNOSIS — E781 Pure hyperglyceridemia: Secondary | ICD-10-CM

## 2020-03-05 DIAGNOSIS — E559 Vitamin D deficiency, unspecified: Secondary | ICD-10-CM | POA: Diagnosis not present

## 2020-03-05 NOTE — Progress Notes (Signed)
Established Patient Office Visit  Subjective:  Patient ID: Trevor Galloway, male    DOB: 1927-03-25  Age: 86 y.o. MRN: [id number]  CC:  Chief Complaint  Patient presents with  . Hyperlipidemia  . Hypertension  . Vitamin D deficiency    HPI Trevor Galloway presents for chronic follow-up on hypertension, hyperlipidemia and vitamin D deficiency.  Is accompanied by his son.  Has no acute concerns today.  HTN: Pt denies chest pain, palpitations, dizziness or lower extremity swelling. Taking medication as directed without side effects.  Does not check BP at home. Pt follows a low salt diet.  HLD: Pt takes fish oil for triglycerides. Controlling hyperlipidemia with diet.  Patient is fasting.  Vitamin D deficiency: Reports compliance with vitamin D supplement twice weekly.   Past Medical History:  Diagnosis Date  . Atrial flutter ([place])    s/p CTI ablation 8/11  . Cancer [facility])    Prostate cancer  . Family hx colonic polyps   . H/O prostate cancer 10/04/2015  . Hiatal hernia   . Hyperlipidemia   . Hypertension   . RBBB (right bundle branch block)     Past Surgical History:  Procedure Laterality Date  . EYE SURGERY     bilateral cataracts  . HERNIA REPAIR    . PROSTATE SURGERY     seed implant    Family History  Problem Relation Age of Onset  . COPD Father   . Parkinsonism Sister   . Parkinsonism Brother   . Cancer Brother        prostate cancer    Social History   Socioeconomic History  . Marital status: Single    Spouse name: Not on file  . Number of children: Not on file  . Years of education: Not on file  . Highest education level: Not on file  Occupational History  . 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 Smoking status: Former Smoker    Quit date: 09/23/1965    Years since quitting: 54.5  . Smokeless tobacco: Never Used  Vaping Use  . Vaping Use: Never used  Substance and Sexual Activity  . Alcohol  use: No    Comment: none now  . Drug use: No  . Sexual activity: Not Currently  Other Topics Concern  . Not on file  Social History Narrative  . Not on file   Social Determinants of Health   Financial Resource Strain:   . Difficulty of Paying Living Expenses: Not on file  Food Insecurity:   . Worried About Charity fundraiser in the Last Year: Not on file  . Ran Out of Food in the Last Year: Not on file  Transportation Needs:   . Lack of Transportation (Medical): Not on file  . Lack of Transportation (Non-Medical): Not on file  Physical Activity:   . Days of Exercise per Week: Not on file  . Minutes of Exercise per Session: Not on file  Stress:   . Feeling of Stress : Not on file  Social Connections:   . Frequency of Communication with Friends and Family: Not on file  . Frequency of Social Gatherings with Friends and Family: Not on file  . Attends Religious Services: Not on file  . Active Member of Clubs or Organizations: Not on file  . Attends Archivist Meetings: Not on file  . Marital Status: Not on file  Intimate Partner  Violence:   . Fear of Current or Ex-Partner: Not on file  . Emotionally Abused: Not on file  . Physically Abused: Not on file  . Sexually Abused: Not on file    Outpatient Medications Prior to Visit  Medication Sig Dispense Refill  . Ascorbic Acid (VITAMIN C) 1000 MG tablet Take 1,000 mg by mouth daily.    Marland Kitchen aspirin 81 MG tablet Take 81 mg by mouth daily.    . hydrochlorothiazide (HYDRODIURIL) 12.5 MG tablet TAKE 1 TABLET BY MOUTH EVERY DAY 90 tablet 1  . metoprolol succinate (TOPROL-XL) 50 MG 24 hr tablet TAKE 1 TABLET BY MOUTH EVERY DAY WITH OR IMMEDIATELY FOLLOWING A MEAL 90 tablet 0  . Multiple Vitamins-Minerals (PRESERVISION AREDS 2) CAPS Take 1 capsule by mouth 2 (two) times daily.    . Omega-3 Fatty Acids (FISH OIL) 1000 MG CAPS Take 3 capsules (3,000 mg total) by mouth daily.    . Vitamin D, Ergocalciferol, (DRISDOL) 1.25 MG (50000  UNIT) CAPS capsule 1 tablet every Wednesday and every Sunday 24 capsule 10   No facility-administered medications prior to visit.    No Known Allergies  ROS Review of Systems A fourteen system review of systems was performed and found to be positive as per HPI.  Objective:    Physical Exam General: Well nourished, in no apparent distress. Eyes: PERRLA, EOMs, conjunctiva clr Resp: Respiratory effort- normal, ECTA B/L w/o wheezing Cardio: RRR S1 S2 Abdomen: no gross distention. Lymphatics:  less 2 sec cap RF, no gross edema M-sk: Good ROM Skin: Warm, dry  Neuro: Alert, Oriented Psych: Normal affect, Insight and Judgment appropriate.   BP 125/74   Pulse 70   Temp 97.9 F (36.6 C) (Oral)   Wt 180 lb 3.2 oz (81.7 kg)   SpO2 99% Comment: on RA  BMI 25.86 kg/m  Wt Readings from Last 3 Encounters:  03/05/20 180 lb 3.2 oz (81.7 kg)  11/04/19 185 lb (83.9 kg)  04/25/19 180 lb (81.6 kg)     There are no preventive care reminders to display for this patient.  There are no preventive care reminders to display for this patient.  Lab Results  Component Value Date   TSH 5.390 (H) 11/11/2019   Lab Results  Component Value Date   WBC 7.1 03/05/2020   HGB 14.4 03/05/2020   HCT 40.4 03/05/2020   MCV 92 03/05/2020   PLT 189 03/05/2020   Lab Results  Component Value Date   NA 138 03/05/2020   K 4.2 03/05/2020   CO2 23 03/05/2020   GLUCOSE 95 03/05/2020   BUN 20 03/05/2020   CREATININE 0.85 03/05/2020   BILITOT 0.5 03/05/2020   ALKPHOS 84 03/05/2020   AST 17 03/05/2020   ALT 11 03/05/2020   PROT 6.2 03/05/2020   ALBUMIN 4.2 03/05/2020   CALCIUM 9.6 03/05/2020   GFR 91.87 09/28/2015   Lab Results  Component Value Date   CHOL 198 03/05/2020   Lab Results  Component Value Date   HDL 39 (L) 03/05/2020   Lab Results  Component Value Date   LDLCALC 133 (H) 03/05/2020   Lab Results  Component Value Date   TRIG 147 03/05/2020   Lab Results  Component Value  Date   CHOLHDL 5.1 (H) 03/05/2020   Lab Results  Component Value Date   HGBA1C 5.6 11/11/2019      Assessment & Plan:   Problem List Items Addressed This Visit      Cardiovascular and Mediastinum  Essential hypertension - Primary (Chronic)   Relevant Orders   Lipid Profile (Completed)   Comp Met (CMET) (Completed)   CBC w/Diff (Completed)     Other   Mixed hyperlipidemia (Chronic)   Relevant Orders   Lipid Profile (Completed)   Comp Met (CMET) (Completed)   CBC w/Diff (Completed)   Vitamin D deficiency (Chronic)   Hypertriglyceridemia (Chronic)   Cancer (HCC)    Other Visit Diagnoses    Need for influenza vaccination       Relevant Orders   Flu Vaccine QUAD High Dose(Fluad) (Completed)     Essential Hypertension: - BP is at goal. - Continue current medication regimen.  Not due for refills yet - Follow DASH diet. - Encourage to stay as active as possible. - Will collect CMP today for medication monitoring.  Mixed hyperlipidemia, Hypertriglyceridemia: - Last lipid panel wnl's with the exception of elevated LDL, triglycerides 135 - Continue current fish oil supplement.  Per chart review, patient has declined statin therapy on various occasions. - Recommend to follow a heart healthy diet and stay as active as possible.  - Will repeat lipid panel today.  Vitamin D deficiency: -Last vitamin D 39.3 -Continue vitamin D supplement of 50,000 units twice weekly. -Will continue to monitor.  Cancer: -Patient is followed by urology, Dr. Lovena Neighbours for h/o prostate cancer.  No orders of the defined types were placed in this encounter.   Follow-up: Return in about 2 months (around 05/05/2020) for [place] .   Note:  This note was prepared with assistance of Dragon voice recognition software. Occasional wrong-word or sound-a-like substitutions may have occurred due to the inherent limitations of voice recognition software.   Lorrene Reid, PA-C

## 2020-03-06 LAB — CBC WITH DIFFERENTIAL/PLATELET
Basophils Absolute: 0.1 10*3/uL (ref 0.0–0.2)
Basos: 1 %
EOS (ABSOLUTE): 0.1 10*3/uL (ref 0.0–0.4)
Eos: 2 %
Hematocrit: 40.4 % (ref 37.5–51.0)
Hemoglobin: 14.4 g/dL (ref 13.0–17.7)
Immature Grans (Abs): 0 10*3/uL (ref 0.0–0.1)
Immature Granulocytes: 1 %
Lymphocytes Absolute: 1.6 10*3/uL (ref 0.7–3.1)
Lymphs: 22 %
MCH: 32.8 pg (ref 26.6–33.0)
MCHC: 35.6 g/dL (ref 31.5–35.7)
MCV: 92 fL (ref 79–97)
Monocytes Absolute: 1.1 10*3/uL — ABNORMAL HIGH (ref 0.1–0.9)
Monocytes: 15 %
Neutrophils Absolute: 4.3 10*3/uL (ref 1.4–7.0)
Neutrophils: 59 %
Platelets: 189 10*3/uL (ref 150–450)
RBC: 4.39 x10E6/uL (ref 4.14–5.80)
RDW: 13.8 % (ref 11.6–15.4)
WBC: 7.1 10*3/uL (ref 3.4–10.8)

## 2020-03-06 LAB — COMPREHENSIVE METABOLIC PANEL
ALT: 11 IU/L (ref 0–44)
AST: 17 IU/L (ref 0–40)
Albumin/Globulin Ratio: 2.1 (ref 1.2–2.2)
Albumin: 4.2 g/dL (ref 3.5–4.6)
Alkaline Phosphatase: 84 IU/L (ref 44–121)
BUN/Creatinine Ratio: 24 (ref 10–24)
BUN: 20 mg/dL (ref 10–36)
Bilirubin Total: 0.5 mg/dL (ref 0.0–1.2)
CO2: 23 mmol/L (ref 20–29)
Calcium: 9.6 mg/dL (ref 8.6–10.2)
Chloride: 103 mmol/L (ref 96–106)
Creatinine, Ser: 0.85 mg/dL (ref 0.76–1.27)
GFR calc Af Amer: 88 mL/min/{1.73_m2} (ref >59)
GFR calc non Af Amer: 76 mL/min/{1.73_m2} (ref >59)
Globulin, Total: 2 g/dL (ref 1.5–4.5)
Glucose: 95 mg/dL (ref 65–99)
Potassium: 4.2 mmol/L (ref 3.5–5.2)
Sodium: 138 mmol/L (ref 134–144)
Total Protein: 6.2 g/dL (ref 6.0–8.5)

## 2020-03-06 LAB — LIPID PANEL
Chol/HDL Ratio: 5.1 ratio — ABNORMAL HIGH (ref 0.0–5.0)
Cholesterol, Total: 198 mg/dL (ref 100–199)
HDL: 39 mg/dL — ABNORMAL LOW (ref >39)
LDL Chol Calc (NIH): 133 mg/dL — ABNORMAL HIGH (ref 0–99)
Triglycerides: 147 mg/dL (ref 0–149)
VLDL Cholesterol Cal: 26 mg/dL (ref 5–40)

## 2020-03-18 DIAGNOSIS — Z23 Encounter for immunization: Secondary | ICD-10-CM | POA: Diagnosis not present

## 2020-04-27 ENCOUNTER — Telehealth: Payer: Self-pay | Admitting: Physician Assistant

## 2020-04-27 NOTE — Telephone Encounter (Signed)
Error

## 2020-04-27 NOTE — Telephone Encounter (Signed)
Patient needs a refill on Toprol. Please send to [facility] on [address] thanks

## 2020-04-28 ENCOUNTER — Telehealth: Payer: Self-pay | Admitting: Physician Assistant

## 2020-04-28 ENCOUNTER — Other Ambulatory Visit: Payer: Self-pay | Admitting: Physician Assistant

## 2020-04-28 DIAGNOSIS — R000 Tachycardia, unspecified: Secondary | ICD-10-CM

## 2020-04-28 DIAGNOSIS — I10 Essential (primary) hypertension: Secondary | ICD-10-CM

## 2020-04-28 MED ORDER — METOPROLOL SUCCINATE ER 50 MG PO TB24: ORAL_TABLET | ORAL | 0 refills | Status: DC

## 2020-04-28 NOTE — Telephone Encounter (Signed)
Patient needs a refill on toprol and uses [facility] on [address]. Thanks

## 2020-04-28 NOTE — Addendum Note (Signed)
Addended by: Fonnie Mu on: 04/28/2020 03:32 PM   Modules accepted: Orders

## 2020-05-04 ENCOUNTER — Other Ambulatory Visit: Payer: Self-pay

## 2020-05-04 ENCOUNTER — Ambulatory Visit (INDEPENDENT_AMBULATORY_CARE_PROVIDER_SITE_OTHER): Payer: Medicare Other | Admitting: Physician Assistant

## 2020-05-04 ENCOUNTER — Encounter: Payer: Self-pay | Admitting: Physician Assistant

## 2020-05-04 VITALS — BP 136/60 | HR 67 | Temp 97.6°F | Wt 165.0 lb

## 2020-05-04 DIAGNOSIS — Z0000 Encounter for general adult medical examination without abnormal findings: Secondary | ICD-10-CM | POA: Diagnosis not present

## 2020-05-04 NOTE — Progress Notes (Signed)
Virtual Visit via Telephone Note:  I connected with Trevor Galloway by telephone and verified that I am speaking with the correct person using two identifiers.    I discussed the limitations, risks, security and privacy concerns for performing an evaluation and management service by telephone and the availability of in person appointments. The staff discussed with patient that there may be a patient responsible charge related to this service. The patient expressed understanding and agreed to proceed.   Location of Patient- Home Location of Provider- Office    Subjective:   Trevor Galloway is a 86 y.o. male who presents for Medicare Annual/Subsequent preventive examination.   Review of Systems    General:   No F/C, wt loss Pulm:   No DIB, SOB, pleuritic chest pain Card:  No CP, palpitations Abd:  No n/v/d or pain Ext:  No inc edema from baseline    Objective:    Today's Vitals   05/04/20 1137  BP: 136/60  Pulse: 67  Temp: 97.6 F (36.4 C)  TempSrc: Oral  Weight: 165 lb (74.8 kg)   Body mass index is 23.68 kg/m.  Advanced Directives 01/01/2016 05/18/2012  Does Patient Have a Medical Advance Directive? Yes Patient has advance directive, copy not in chart  Type of Advance Directive Living will;Healthcare Power of [place];Living will  Copy of [place] in Chart? - Copy requested from family  Pre-existing out of facility DNR order (yellow form or pink MOST form) - No    Current Medications (verified) Outpatient Encounter Medications as of 05/04/2020  Medication Sig  . Ascorbic Acid (VITAMIN C) 1000 MG tablet Take 1,000 mg by mouth daily.  Marland Kitchen aspirin 81 MG tablet Take 81 mg by mouth daily.  . hydrochlorothiazide (HYDRODIURIL) 12.5 MG tablet TAKE 1 TABLET BY MOUTH EVERY DAY  . metoprolol succinate (TOPROL-XL) 50 MG 24 hr tablet TAKE 1 TABLET BY MOUTH EVERY DAY WITH OR IMMEDIATELY FOLLOWING A MEAL  . Multiple Vitamins-Minerals  (PRESERVISION AREDS 2) CAPS Take 1 capsule by mouth 2 (two) times daily.  . Omega-3 Fatty Acids (FISH OIL) 1000 MG CAPS Take 3 capsules (3,000 mg total) by mouth daily.  . Vitamin D, Ergocalciferol, (DRISDOL) 1.25 MG (50000 UNIT) CAPS capsule 1 tablet every Wednesday and every Sunday   No facility-administered encounter medications on file as of 05/04/2020.    Allergies (verified) Patient has no known allergies.   History: Past Medical History:  Diagnosis Date  . Atrial flutter ([place])    s/p CTI ablation 8/11  . Cancer [facility])    Prostate cancer  . Family hx colonic polyps   . H/O prostate cancer 10/04/2015  . Hiatal hernia   . Hyperlipidemia   . Hypertension   . RBBB (right bundle branch block)    Past Surgical History:  Procedure Laterality Date  . EYE SURGERY     bilateral cataracts  . HERNIA REPAIR    . PROSTATE SURGERY     seed implant   Family History  Problem Relation Age of Onset  . COPD Father   . Parkinsonism Sister   . Parkinsonism Brother   . Cancer Brother        prostate cancer   Social History   Socioeconomic History  . Marital status: Single    Spouse name: Not on file  . Number of children: Not on file  . Years of education: Not on file  . Highest education level: Not on file  Occupational History  . 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 Smoking status: Former Smoker    Quit date: 09/23/1965    Years since quitting: 54.6  . Smokeless tobacco: Never Used  Vaping Use  . Vaping Use: Never used  Substance and Sexual Activity  . Alcohol use: No    Comment: none now  . Drug use: No  . Sexual activity: Not Currently  Other Topics Concern  . Not on file  Social History Narrative  . Not on file   Social Determinants of Health   Financial Resource Strain: Not on file  Food Insecurity: Not on file  Transportation Needs: Not on file  Physical Activity: Not on file  Stress: Not on file  Social  Connections: Not on file    Tobacco Counseling Counseling given: Not Answered    Diabetic? No     Activities of Daily Living In your present state of health, do you have any difficulty performing the following activities: 05/04/2020 03/05/2020  Hearing? N N  Vision? Y N  Difficulty concentrating or making decisions? Tempie Donning  Walking or climbing stairs? Y N  Dressing or bathing? N N  Doing errands, shopping? Y N  Some recent data might be hidden    Patient Care Team: Lorrene Reid, PA-C as PCP - General Rana Snare, MD as Consulting Physician (Urology) Sherlynn Stalls, MD as Consulting Physician (Ophthalmology) Druscilla Brownie, MD as Consulting Physician (Dermatology) Dorna Leitz, MD as Consulting Physician (Orthopedic Surgery) Tat, Eustace Quail, DO as Consulting Physician (Neurology) Thompson Grayer, MD as Consulting Physician (Cardiology) Ceasar Mons, MD as Consulting Physician (Urology)  Indicate any recent Medical Services you may have received from other than [facility] providers in the past year (date may be approximate).     Assessment:   This is a routine wellness examination for Trevor Galloway.  Hearing/Vision screen No exam data present  Dietary issues and exercise activities discussed: -Recommend to follow a heart healthy diet and reduce cheese. Continue good hydration and stay as active as possible.   Goals   None    Depression Screen PHQ 2/9 Scores 05/04/2020 03/05/2020 11/04/2019 04/25/2019 12/31/2018 02/27/2018 11/07/2017  PHQ - 2 Score 4 0 2 0 0 3 3  PHQ- 9 Score 8 0 5 2 - 12 3    Fall Risk Fall Risk  05/04/2020 03/05/2020 04/25/2019 12/31/2018 12/14/2018  Falls in the past year? 0 0 1 0 0  Comment - - - - Emmi Telephone Survey: data to providers prior to load  Number falls in past yr: - - 1 0 -  Injury with Fall? - - 1 0 -  Risk for fall due to : No Fall Risks No Fall Risks - - -  Risk for fall due to: Comment - - - - -  Follow up Falls  evaluation completed Falls evaluation completed Falls evaluation completed Falls evaluation completed -    FALL RISK PREVENTION PERTAINING TO THE HOME:  Any stairs in or around the home? Yes  If so, are there any without handrails? NO Home free of loose throw rugs in walkways, pet beds, electrical cords, etc? Yes  Adequate lighting in your home to reduce risk of falls? Yes   ASSISTIVE DEVICES UTILIZED TO PREVENT FALLS:  Life alert? No  Use of a cane, walker or w/c? Yes  Grab bars in the bathroom? Yes  Shower chair or bench in shower? Yes  Elevated  toilet seat or a handicapped toilet? Yes   TIMED UP AND GO:  Was the test performed? No .  Pt not seen in office  Cognitive Function: MMSE - Mini Mental State Exam 04/25/2019 04/25/2019  Orientation to time 3 3  Orientation to Place 5 5  Attention/ Calculation 4 -  Recall 0 -     6CIT Screen 05/04/2020 04/25/2019  What Year? 4 points 4 points  What month? 0 points 0 points  What time? 0 points 3 points  Count back from 20 0 points 2 points  Months in reverse 4 points 4 points  Repeat phrase 10 points 10 points  Total Score 18 23    Immunizations Immunization History  Administered Date(s) Administered  . Fluad Quad(high Dose 65+) 03/06/2019, 03/05/2020  . Influenza Whole 02/13/2009  . Influenza, High Dose Seasonal PF 03/28/2014, 02/12/2018  . Influenza,inj,Quad PF,6+ Mos 02/18/2013, 02/07/2015  . Influenza-Unspecified 02/10/2016, 02/12/2018  . PFIZER SARS-COV-2 Vaccination 07/14/2019, 08/09/2019  . Pneumococcal Conjugate-13 03/25/2014, 02/22/2017  . Pneumococcal Polysaccharide-23 03/30/2015  . Tdap 01/31/2012    TDAP status: Up to date  Flu Vaccine status: Up to date  Pneumococcal vaccine status: Up to date  Covid-19 vaccine status: Completed vaccines  Qualifies for Shingles Vaccine? Yes   Zostavax completed No   Shingrix Completed?: No.    Education has been provided regarding the importance of this vaccine.  Patient has been advised to call insurance company to determine out of pocket expense if they have not yet received this vaccine. Advised may also receive vaccine at local pharmacy or Health Dept. Verbalized acceptance and understanding.  Screening Tests Health Maintenance  Topic Date Due  . COVID-19 Vaccine (3 - Pfizer risk 4-dose series) 09/06/2019  . TETANUS/TDAP  01/30/2022  . INFLUENZA VACCINE  Completed  . PNA vac Low Risk Adult  Completed    Health Maintenance  Health Maintenance Due  Topic Date Due  . COVID-19 Vaccine (3 - Pfizer risk 4-dose series) 09/06/2019    Colorectal cancer screening: No longer required.   Lung Cancer Screening: (Low Dose CT Chest recommended if Age 43-80 years, 30 pack-year currently smoking OR have quit w/in 15years.) does not qualify.    Additional Screening:  Hepatitis C Screening: does qualify; Completed:  no  Vision Screening: Recommended annual ophthalmology exams for early detection of glaucoma and other disorders of the eye. Is the patient up to date with their annual eye exam?  No  Who is the provider or what is the name of the office in which the patient attends annual eye exams? Dr. Royston Bake  Dental Screening: Recommended annual dental exams for proper oral hygiene  Community Resource Referral / Chronic Care Management: CRR required this visit?  No   CCM required this visit?  No      Plan:  -Patient denies depressive symptomatology and SI/HI. -Continue current medication regimen. -Patient and wife report pt was evaluated by Neurology for possible Parkinson's (FHx of PD) which resulted negative. Unable to locate consultation/record for review. [facility] appt 06/2019 was cancelled. -Follow up in 4 months for HTN, HLD  I have personally reviewed and noted the following in the patient's chart:   . Medical and social history . Use of alcohol, tobacco or illicit drugs  . Current medications and supplements . Functional ability and  status . Nutritional status . Physical activity . Advanced directives . List of other physicians . Hospitalizations, surgeries, and ER visits in previous 12 months . Vitals . Screenings to  include cognitive, depression, and falls . Referrals and appointments  In addition, I have reviewed and discussed with patient certain preventive protocols, quality metrics, and best practice recommendations. A written personalized care plan for preventive services as well as general preventive health recommendations were provided to patient.

## 2020-08-05 ENCOUNTER — Telehealth: Payer: Self-pay | Admitting: Physician Assistant

## 2020-08-05 DIAGNOSIS — I10 Essential (primary) hypertension: Secondary | ICD-10-CM

## 2020-08-05 DIAGNOSIS — R000 Tachycardia, unspecified: Secondary | ICD-10-CM

## 2020-08-05 MED ORDER — METOPROLOL SUCCINATE ER 50 MG PO TB24: ORAL_TABLET | ORAL | 0 refills | Status: DC

## 2020-08-05 NOTE — Telephone Encounter (Signed)
Refill sent to requested pharmacy. AS, CMA 

## 2020-11-06 ENCOUNTER — Other Ambulatory Visit: Payer: Self-pay | Admitting: Physician Assistant

## 2020-11-06 DIAGNOSIS — I10 Essential (primary) hypertension: Secondary | ICD-10-CM

## 2020-11-06 DIAGNOSIS — R000 Tachycardia, unspecified: Secondary | ICD-10-CM

## 2020-11-09 ENCOUNTER — Other Ambulatory Visit: Payer: Self-pay | Admitting: Physician Assistant

## 2020-11-09 ENCOUNTER — Telehealth: Payer: Self-pay | Admitting: Physician Assistant

## 2020-11-09 DIAGNOSIS — I10 Essential (primary) hypertension: Secondary | ICD-10-CM

## 2020-11-09 DIAGNOSIS — R000 Tachycardia, unspecified: Secondary | ICD-10-CM

## 2020-11-09 NOTE — Telephone Encounter (Signed)
Please contact patient to schedule apt per last AVS for medication refills. AS, CMA

## 2020-11-22 ENCOUNTER — Other Ambulatory Visit: Payer: Self-pay | Admitting: Physician Assistant

## 2020-11-22 DIAGNOSIS — I10 Essential (primary) hypertension: Secondary | ICD-10-CM

## 2020-11-23 ENCOUNTER — Other Ambulatory Visit: Payer: Self-pay | Admitting: Physician Assistant

## 2020-11-23 DIAGNOSIS — I10 Essential (primary) hypertension: Secondary | ICD-10-CM

## 2020-11-26 ENCOUNTER — Ambulatory Visit (INDEPENDENT_AMBULATORY_CARE_PROVIDER_SITE_OTHER): Payer: PRIVATE HEALTH INSURANCE | Admitting: Physician Assistant

## 2020-11-26 ENCOUNTER — Other Ambulatory Visit: Payer: Self-pay

## 2020-11-26 ENCOUNTER — Encounter: Payer: Self-pay | Admitting: Physician Assistant

## 2020-11-26 VITALS — BP 124/74 | HR 79 | Temp 98.1°F | Ht 70.0 in | Wt 170.0 lb

## 2020-11-26 DIAGNOSIS — R000 Tachycardia, unspecified: Secondary | ICD-10-CM | POA: Diagnosis not present

## 2020-11-26 DIAGNOSIS — E782 Mixed hyperlipidemia: Secondary | ICD-10-CM

## 2020-11-26 DIAGNOSIS — E559 Vitamin D deficiency, unspecified: Secondary | ICD-10-CM

## 2020-11-26 DIAGNOSIS — I10 Essential (primary) hypertension: Secondary | ICD-10-CM

## 2020-11-26 MED ORDER — HYDROCHLOROTHIAZIDE 12.5 MG PO TABS
12.5000 mg | ORAL_TABLET | Freq: Every day | ORAL | 3 refills | Status: DC
Start: 2020-11-26 — End: 2020-12-22

## 2020-11-26 MED ORDER — VITAMIN D (ERGOCALCIFEROL) 1.25 MG (50000 UNIT) PO CAPS: ORAL_CAPSULE | ORAL | 10 refills | Status: DC

## 2020-11-26 MED ORDER — METOPROLOL SUCCINATE ER 50 MG PO TB24: ORAL_TABLET | ORAL | 3 refills | Status: DC

## 2020-11-26 NOTE — Patient Instructions (Signed)

## 2020-11-26 NOTE — Progress Notes (Addendum)
Established Patient Office Visit  Subjective:  Patient ID: Trevor Galloway, male    DOB: 05-Dec-1926  Age: 87 y.o. MRN: [id number]  CC:  Chief Complaint  Patient presents with   Hypertension   Hyperlipidemia     HPI Trevor Galloway presents for follow up on hypertension and hyperlipidemia. Patient is accompanied by his daughter. Patient's daughter is requesting annual supply of medication given patient's age and deconditioning which makes it challenging to get around. Patient has no acute concerns.  HTN: Pt denies chest pain, palpitations, dizziness, shortness of breath, or lower extremity swelling. Taking medication as directed without side effects. BP is not checked or monitored at home. Pt follows a low salt diet.  HLD: Pt is not on statin therapy. Managing with lifestyle. Reports takes fish oil.   Vitamin D deficiency: Reports his medication is managed by his wife and believes he is taking Vitamin D supplement.    Past Medical History:  Diagnosis Date   Atrial flutter ([place])    s/p CTI ablation 8/11   Cancer [facility])    Prostate cancer   Family hx colonic polyps    H/O prostate cancer 10/04/2015   Hiatal hernia    Hyperlipidemia    Hypertension    RBBB (right bundle branch block)     Past Surgical History:  Procedure Laterality Date   EYE SURGERY     bilateral cataracts   HERNIA REPAIR     PROSTATE SURGERY     seed implant    Family History  Problem Relation Age of Onset   COPD Father    Parkinsonism Sister    Parkinsonism Brother    Cancer Brother        prostate cancer    Social History   Socioeconomic History   Marital status: Single    Spouse name: Not on file   Number of children: Not on file   Years of education: Not on file   Highest education level: Not on file  Occupational History   Occupation: retired    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  Tobacco Use   Smoking status: Former    Types: Cigarettes    Quit date: 09/23/1965     Years since quitting: 55.2   Smokeless tobacco: Never  Vaping Use   Vaping Use: Never used  Substance and Sexual Activity   Alcohol use: No    Comment: none now   Drug use: No   Sexual activity: Not Currently  Other Topics Concern   Not on file  Social History Narrative   Not on file   Social Determinants of Health   Financial Resource Strain: Not on file  Food Insecurity: Not on file  Transportation Needs: Not on file  Physical Activity: Not on file  Stress: Not on file  Social Connections: Not on file  Intimate Partner Violence: Not on file    Outpatient Medications Prior to Visit  Medication Sig Dispense Refill   aspirin 81 MG tablet Take 81 mg by mouth daily.     Multiple Vitamins-Minerals (PRESERVISION AREDS 2) CAPS Take 1 capsule by mouth 2 (two) times daily.     Omega-3 Fatty Acids (FISH OIL) 1000 MG CAPS Take 3 capsules (3,000 mg total) by mouth daily.     hydrochlorothiazide (HYDRODIURIL) 12.5 MG tablet **NEEDS APT FOR REFILLS**TAKE 1 TABLET BY MOUTH EVERY DAY 30 tablet 0   metoprolol succinate (TOPROL-XL) 50 MG 24 hr tablet **NEEDS APT FOR REFILLS**TAKE 1  TABLET BY MOUTH EVERY DAY WITH OR IMMEDIATELY FOLLOWING A MEAL 30 tablet 0   Vitamin D, Ergocalciferol, (DRISDOL) 1.25 MG (50000 UNIT) CAPS capsule 1 tablet every Wednesday and every Sunday 24 capsule 10   Ascorbic Acid (VITAMIN C) 1000 MG tablet Take 1,000 mg by mouth daily.     No facility-administered medications prior to visit.    No Known Allergies  ROS Review of Systems A fourteen system review of systems was performed and found to be positive as per HPI.   Objective:    Physical Exam General:  Well Developed, well nourished, in no acute distress  Neuro:  Alert and oriented,  extra-ocular muscles intact  HEENT:  Normocephalic, atraumatic, neck supple Skin:  no gross rash, warm, pink. Cardiac:  RRR Respiratory:  ECTA B/L, Not using accessory muscles, speaking in full sentences- unlabored. Vascular:   Ext warm, no cyanosis apprec.; cap RF less 2 sec. 1+ pitting edema b/l Psych:  No HI/SI, judgement and insight good, Euthymic mood. Full Affect.  BP 124/74   Pulse 79   Temp 98.1 F (36.7 C)   Ht 5' 10"  (1.778 m)   Wt 170 lb (77.1 kg)   SpO2 96%   BMI 24.39 kg/m  Wt Readings from Last 3 Encounters:  11/26/20 170 lb (77.1 kg)  05/04/20 165 lb (74.8 kg)  03/05/20 180 lb 3.2 oz (81.7 kg)     Health Maintenance Due  Topic Date Due   Zoster Vaccines- Shingrix (1 of 2) Never done   COVID-19 Vaccine (3 - Pfizer risk series) 09/06/2019    There are no preventive care reminders to display for this patient.  Lab Results  Component Value Date   TSH 5.390 (H) 11/11/2019   Lab Results  Component Value Date   WBC 7.1 03/05/2020   HGB 14.4 03/05/2020   HCT 40.4 03/05/2020   MCV 92 03/05/2020   PLT 189 03/05/2020   Lab Results  Component Value Date   NA 138 03/05/2020   K 4.2 03/05/2020   CO2 23 03/05/2020   GLUCOSE 95 03/05/2020   BUN 20 03/05/2020   CREATININE 0.85 03/05/2020   BILITOT 0.5 03/05/2020   ALKPHOS 84 03/05/2020   AST 17 03/05/2020   ALT 11 03/05/2020   PROT 6.2 03/05/2020   ALBUMIN 4.2 03/05/2020   CALCIUM 9.6 03/05/2020   GFR 91.87 09/28/2015   Lab Results  Component Value Date   CHOL 198 03/05/2020   Lab Results  Component Value Date   HDL 39 (L) 03/05/2020   Lab Results  Component Value Date   LDLCALC 133 (H) 03/05/2020   Lab Results  Component Value Date   TRIG 147 03/05/2020   Lab Results  Component Value Date   CHOLHDL 5.1 (H) 03/05/2020   Lab Results  Component Value Date   HGBA1C 5.6 11/11/2019      Assessment & Plan:   Problem List Items Addressed This Visit       Cardiovascular and Mediastinum   Essential hypertension - Primary (Chronic)   Relevant Medications   hydrochlorothiazide (HYDRODIURIL) 12.5 MG tablet   metoprolol succinate (TOPROL-XL) 50 MG 24 hr tablet   Other Relevant Orders   Comp Met (CMET)   CBC  w/Diff     Other   Mixed hyperlipidemia (Chronic)   Relevant Medications   hydrochlorothiazide (HYDRODIURIL) 12.5 MG tablet   metoprolol succinate (TOPROL-XL) 50 MG 24 hr tablet   Other Relevant Orders   Direct LDL   Vitamin  D deficiency (Chronic)   Relevant Medications   Vitamin D, Ergocalciferol, (DRISDOL) 1.25 MG (50000 UNIT) CAPS capsule   Other Relevant Orders   Vitamin D (25 hydroxy)   Other Visit Diagnoses     h/o Tachycardia  (Chronic)      Relevant Medications   metoprolol succinate (TOPROL-XL) 50 MG 24 hr tablet      Essential hypertension, h/o tachycardia: -Controlled.  -Will continue current medication regimen. Provided refill. -Continue low sodium diet and recommend elevated and/or use compression socks for LE edema.  -Will continue to monitor. Will collect CMP for medication monitoring.  Mixed hyperlipidemia: -Last lipid panel: total cholesterol 198, triglycerides 147, HDL 39, LDL 133 -Given patient's age and risks vs benefits of statin therapy will continue with dietary and lifestyle management. -Will collect direct LDL. -Will continue to monitor.  Vitamin D deficiency: -Last Vitamin D: 39.3, will repeat today. Pending results will make treatment adjustments if indicated.  Discussed with patient and daughter concerns which are reasonable. Will provide 12 month supply of medication regimen. Discussed the importance of routine follow up visits including medicare wellness and labs which are essential, especially for medication monitoring. Discussed the availability of virtual visits to facilitate transportation. Both verbalized understanding.     Meds ordered this encounter  Medications   hydrochlorothiazide (HYDRODIURIL) 12.5 MG tablet    Sig: Take 1 tablet (12.5 mg total) by mouth daily. TAKE 1 TABLET BY MOUTH EVERY DAY    Dispense:  90 tablet    Refill:  3    Order Specific Question:   Supervising Provider    Answer:   Beatrice Lecher D [[id number]]    metoprolol succinate (TOPROL-XL) 50 MG 24 hr tablet    Sig: TAKE 1 TABLET BY MOUTH EVERY DAY WITH OR IMMEDIATELY FOLLOWING A MEAL    Dispense:  90 tablet    Refill:  3    Order Specific Question:   Supervising Provider    Answer:   Beatrice Lecher D [[id number]]   Vitamin D, Ergocalciferol, (DRISDOL) 1.25 MG (50000 UNIT) CAPS capsule    Sig: 1 tablet every Wednesday and every Sunday    Dispense:  24 capsule    Refill:  10    Order Specific Question:   Supervising Provider    Answer:   Beatrice Lecher D [[id number]]     Follow-up: Return for [facility] after May 04 2021 (telehealth).   Note:  This note was prepared with assistance of Dragon voice recognition software. Occasional wrong-word or sound-a-like substitutions may have occurred due to the inherent limitations of voice recognition software.  Lorrene Reid, PA-C

## 2020-11-27 LAB — COMPREHENSIVE METABOLIC PANEL
ALT: 10 IU/L (ref 0–44)
AST: 15 IU/L (ref 0–40)
Albumin/Globulin Ratio: 1.6 (ref 1.2–2.2)
Albumin: 3.8 g/dL (ref 3.5–4.6)
Alkaline Phosphatase: 85 IU/L (ref 44–121)
BUN/Creatinine Ratio: 21 (ref 10–24)
BUN: 19 mg/dL (ref 10–36)
Bilirubin Total: 0.3 mg/dL (ref 0.0–1.2)
CO2: 22 mmol/L (ref 20–29)
Calcium: 9.9 mg/dL (ref 8.6–10.2)
Chloride: 100 mmol/L (ref 96–106)
Creatinine, Ser: 0.89 mg/dL (ref 0.76–1.27)
Globulin, Total: 2.4 g/dL (ref 1.5–4.5)
Glucose: 123 mg/dL — ABNORMAL HIGH (ref 65–99)
Potassium: 3.8 mmol/L (ref 3.5–5.2)
Sodium: 138 mmol/L (ref 134–144)
Total Protein: 6.2 g/dL (ref 6.0–8.5)
eGFR: 80 mL/min/{1.73_m2} (ref >59)

## 2020-11-27 LAB — CBC WITH DIFFERENTIAL/PLATELET
Basophils Absolute: 0.1 10*3/uL (ref 0.0–0.2)
Basos: 1 %
EOS (ABSOLUTE): 0.1 10*3/uL (ref 0.0–0.4)
Eos: 1 %
Hematocrit: 40.2 % (ref 37.5–51.0)
Hemoglobin: 14 g/dL (ref 13.0–17.7)
Immature Grans (Abs): 0 10*3/uL (ref 0.0–0.1)
Immature Granulocytes: 1 %
Lymphocytes Absolute: 1.5 10*3/uL (ref 0.7–3.1)
Lymphs: 22 %
MCH: 32.6 pg (ref 26.6–33.0)
MCHC: 34.8 g/dL (ref 31.5–35.7)
MCV: 94 fL (ref 79–97)
Monocytes Absolute: 0.9 10*3/uL (ref 0.1–0.9)
Monocytes: 13 %
Neutrophils Absolute: 4.1 10*3/uL (ref 1.4–7.0)
Neutrophils: 62 %
Platelets: 189 10*3/uL (ref 150–450)
RBC: 4.3 x10E6/uL (ref 4.14–5.80)
RDW: 13.7 % (ref 11.6–15.4)
WBC: 6.6 10*3/uL (ref 3.4–10.8)

## 2020-11-27 LAB — VITAMIN D 25 HYDROXY (VIT D DEFICIENCY, FRACTURES): Vit D, 25-Hydroxy: 40.6 ng/mL (ref 30.0–100.0)

## 2020-11-27 LAB — LDL CHOLESTEROL, DIRECT: LDL Direct: 127 mg/dL — ABNORMAL HIGH (ref 0–99)

## 2020-12-03 ENCOUNTER — Telehealth: Payer: Self-pay | Admitting: Physician Assistant

## 2020-12-03 NOTE — Telephone Encounter (Signed)
Patient's daughter called in requesting refills on all prescriptions for a year. Please advise, thanks.    Prescriptions would include- Hydrodiuril, Toprol-XL, Drisdol.

## 2020-12-03 NOTE — Telephone Encounter (Signed)
A year supply for all meds was sent to pharmacy at patient last apt. AS, CMA

## 2020-12-22 ENCOUNTER — Other Ambulatory Visit: Payer: Self-pay | Admitting: Physician Assistant

## 2020-12-22 DIAGNOSIS — I10 Essential (primary) hypertension: Secondary | ICD-10-CM

## 2021-01-13 ENCOUNTER — Telehealth: Payer: Self-pay | Admitting: Lab

## 2021-01-13 NOTE — Chronic Care Management (AMB) (Signed)
  Chronic Care Management   Note  01/13/2021 Name: Trevor Galloway MRN: [id number] DOB: 07-27-1926  Trevor Galloway is a 87 y.o. year old male who is a primary care patient of Lorrene Reid, [place]. I reached out to [name] by phone today in response to a referral sent by Trevor Galloway's PCP, Lorrene Reid, PA-C.   Trevor Galloway was given information about Chronic Care Management services today including:  CCM service includes personalized support from designated clinical staff supervised by his physician, including individualized plan of care and coordination with other care providers 24/7 contact phone numbers for assistance for urgent and routine care needs. Service will only be billed when office clinical staff spend 20 minutes or more in a month to coordinate care. Only one practitioner may furnish and bill the service in a calendar month. The patient may stop CCM services at any time (effective at the end of the month) by phone call to the office staff.   Patient agreed to services and verbal consent obtained.   Follow up plan:   [place]

## 2021-02-16 ENCOUNTER — Telehealth: Payer: Self-pay

## 2021-02-16 NOTE — Progress Notes (Signed)
    Chronic Care Management Pharmacy Assistant   Name: AJAMU MAXON  MRN: [id number] DOB: February 25, 1927  Trevor Galloway is an 87 y.o. year old male who presents for his initial CCM visit with the clinical pharmacist.   Recent office visits:  11/26/20 Lorrene Reid PA - Seen for hypertension - Labs ordered - No medication changes noted - Follow up in December   Recent consult visits:  None noted   Hospital visits:  None in previous 6 months  Medications: Outpatient Encounter Medications as of 02/16/2021  Medication Sig   Ascorbic Acid (VITAMIN C) 1000 MG tablet Take 1,000 mg by mouth daily.   aspirin 81 MG tablet Take 81 mg by mouth daily.   hydrochlorothiazide (HYDRODIURIL) 12.5 MG tablet TAKE 1 TABLET BY MOUTH EVERY DAY(NEED APPOINTMENT FOR REFILLS)   metoprolol succinate (TOPROL-XL) 50 MG 24 hr tablet TAKE 1 TABLET BY MOUTH EVERY DAY WITH OR IMMEDIATELY FOLLOWING A MEAL   Multiple Vitamins-Minerals (PRESERVISION AREDS 2) CAPS Take 1 capsule by mouth 2 (two) times daily.   Omega-3 Fatty Acids (FISH OIL) 1000 MG CAPS Take 3 capsules (3,000 mg total) by mouth daily.   Vitamin D, Ergocalciferol, (DRISDOL) 1.25 MG (50000 UNIT) CAPS capsule 1 tablet every Wednesday and every Sunday   No facility-administered encounter medications on file as of 02/16/2021.    Care Gaps: Zoster Vaccines- Shingrix Overdue - never done  COVID-19 Vaccine Last completed: Aug 09, 2019 INFLUENZA VACCINE Last completed: Mar 05, 2020  Ascorbic Acid (VITAMIN C) 1000 MG tablet - Last fill 07/10/17 aspirin 81 MG tablet- Last fill 09/16/13 hydrochlorothiazide (HYDRODIURIL) 12.5 MG tablet- Last fill 12/22/20 90 DS metoprolol succinate (TOPROL-XL) 50 MG 24 hr tablet- Last fill 11/26/20 90 DS Multiple Vitamins-Minerals (PRESERVISION AREDS 2) CAPS- Last fill 03/30/15 Omega-3 Fatty Acids (FISH OIL) 1000 MG CAPS- Last fill 01/31/17 Vitamin D, Ergocalciferol, (DRISDOL) 1.25 MG (50000 UNIT) CAPS capsule- Last fill  11/26/20   Star Rating Drugs: None  Andee Poles, CMA

## 2021-02-22 ENCOUNTER — Telehealth: Payer: PRIVATE HEALTH INSURANCE

## 2021-03-31 ENCOUNTER — Other Ambulatory Visit: Payer: Self-pay | Admitting: Physician Assistant

## 2021-03-31 DIAGNOSIS — E559 Vitamin D deficiency, unspecified: Secondary | ICD-10-CM

## 2021-05-05 ENCOUNTER — Ambulatory Visit (INDEPENDENT_AMBULATORY_CARE_PROVIDER_SITE_OTHER): Payer: Medicare HMO | Admitting: Physician Assistant

## 2021-05-05 ENCOUNTER — Other Ambulatory Visit: Payer: Self-pay

## 2021-05-05 ENCOUNTER — Encounter: Payer: Self-pay | Admitting: Physician Assistant

## 2021-05-05 VITALS — BP 146/68 | Ht 71.0 in | Wt 170.0 lb

## 2021-05-05 DIAGNOSIS — Z0000 Encounter for general adult medical examination without abnormal findings: Secondary | ICD-10-CM

## 2021-05-05 NOTE — Progress Notes (Signed)
Virtual Visit via Telephone Note:  I connected with Trevor Galloway by telephone and verified that I am speaking with the correct person using two identifiers.    I discussed the limitations, risks, security and privacy concerns for performing an evaluation and management service by telephone and the availability of in person appointments. The staff discussed with patient that there may be a patient responsible charge related to this service. The patient expressed understanding and agreed to proceed.   Location of Patient- Home Location of Provider- Office   Subjective:   Trevor Galloway is a 87 y.o. male who presents for Medicare Annual/Subsequent preventive examination.  Review of Systems    General:   No F/C, wt loss Pulm:   No DIB, SOB, pleuritic chest pain Card:  No CP, palpitations Abd:  No n/v/d or pain Ext:  No inc edema from baseline    Objective:    Today's Vitals   05/05/21 0955  BP: (!) 146/68  Weight: 170 lb (77.1 kg)  Height: 5\' 11"  (1.803 m)   Body mass index is 23.71 kg/m.  Advanced Directives 01/01/2016 05/18/2012  Does Patient Have a Medical Advance Directive? Yes Patient has advance directive, copy not in chart  Type of Advance Directive Living will;Healthcare Power of [place];Living will  Copy of [place] in Chart? - Copy requested from family  Pre-existing out of facility DNR order (yellow form or pink MOST form) - No    Current Medications (verified) Outpatient Encounter Medications as of 05/05/2021  Medication Sig   Ascorbic Acid (VITAMIN C) 1000 MG tablet Take 1,000 mg by mouth daily.   aspirin 81 MG tablet Take 81 mg by mouth daily.   hydrochlorothiazide (HYDRODIURIL) 12.5 MG tablet TAKE 1 TABLET BY MOUTH EVERY DAY(NEED APPOINTMENT FOR REFILLS)   metoprolol succinate (TOPROL-XL) 50 MG 24 hr tablet TAKE 1 TABLET BY MOUTH EVERY DAY WITH OR IMMEDIATELY FOLLOWING A MEAL   Multiple Vitamins-Minerals  (PRESERVISION AREDS 2) CAPS Take 1 capsule by mouth 2 (two) times daily.   Omega-3 Fatty Acids (FISH OIL) 1000 MG CAPS Take 3 capsules (3,000 mg total) by mouth daily.   Vitamin D, Ergocalciferol, (DRISDOL) 1.25 MG (50000 UNIT) CAPS capsule TAKE 1 CAPSULE EVERY WEDNESDAY AND SUNDAY   No facility-administered encounter medications on file as of 05/05/2021.    Allergies (verified) Patient has no known allergies.   History: Past Medical History:  Diagnosis Date   Atrial flutter ([place])    s/p CTI ablation 8/11   Cancer [facility])    Prostate cancer   Family hx colonic polyps    H/O prostate cancer 10/04/2015   Hiatal hernia    Hyperlipidemia    Hypertension    RBBB (right bundle branch block)    Past Surgical History:  Procedure Laterality Date   EYE SURGERY     bilateral cataracts   HERNIA REPAIR     PROSTATE SURGERY     seed implant   Family History  Problem Relation Age of Onset   COPD Father    Parkinsonism Sister    Parkinsonism Brother    Cancer Brother        prostate cancer   Social History   Socioeconomic History   Marital status: Single    Spouse name: Not on file   Number of children: Not on file   Years of education: Not on file   Highest education level: Not on file  Occupational History  Occupation: retired    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  Tobacco Use   Smoking status: Former    Types: Cigarettes    Quit date: 09/23/1965    Years since quitting: 55.6   Smokeless tobacco: Never  Vaping Use   Vaping Use: Never used  Substance and Sexual Activity   Alcohol use: No    Comment: none now   Drug use: No   Sexual activity: Not Currently  Other Topics Concern   Not on file  Social History Narrative   Not on file   Social Determinants of Health   Financial Resource Strain: Not on file  Food Insecurity: Not on file  Transportation Needs: Not on file  Physical Activity: Not on file  Stress: Not on file  Social Connections: Not on  file    Tobacco Counseling Counseling given: Not Answered   Diabetic? no     Activities of Daily Living In your present state of health, do you have any difficulty performing the following activities: 05/05/2021 11/26/2020  Hearing? Y N  Vision? Y Y  Difficulty concentrating or making decisions? N Y  Walking or climbing stairs? N Y  Dressing or bathing? N Y  Doing errands, shopping? N Y  Some recent data might be hidden    Patient Care Team: Lorrene Reid, PA-C as PCP - General Rana Snare, MD (Inactive) as Consulting Physician (Urology) Sherlynn Stalls, MD as Consulting Physician (Ophthalmology) Druscilla Brownie, MD as Consulting Physician (Dermatology) Dorna Leitz, MD as Consulting Physician (Orthopedic Surgery) Tat, Eustace Quail, DO as Consulting Physician (Neurology) Thompson Grayer, MD as Consulting Physician (Cardiology) Ceasar Mons, MD as Consulting Physician (Urology) Darius Bump, [facility] as Pharmacist (Pharmacist)  Indicate any recent Medical Services you may have received from other than [facility] providers in the past year (date may be approximate).     Assessment:   This is a routine wellness examination for [facility].  Hearing/Vision screen No results found.  Dietary issues and exercise activities discussed: -Heart healthy diet low in fat and carbohydrates. Stay as active as possible around the house.    Goals Addressed   None   Depression Screen PHQ 2/9 Scores 05/05/2021 11/26/2020 05/04/2020 03/05/2020 11/04/2019 04/25/2019 12/31/2018  PHQ - 2 Score 0 - 4 0 2 0 0  PHQ- 9 Score 0 - 8 0 5 2 -  Exception Documentation - Patient refusal - - - - -    Fall Risk Fall Risk  05/05/2021 11/26/2020 05/04/2020 03/05/2020 04/25/2019  Falls in the past year? 0 0 0 0 1  Comment - - - - -  Number falls in past yr: 0 0 - - 1  Injury with Fall? 0 0 - - 1  Risk for fall due to : Impaired balance/gait Impaired balance/gait;Impaired mobility No Fall Risks No  Fall Risks -  Risk for fall due to: Comment - - - - -  Follow up Falls evaluation completed Falls evaluation completed Falls evaluation completed Falls evaluation completed Falls evaluation completed    [place]:  Any stairs in or around the home? Yes  If so, are there any without handrails? No  Home free of loose throw rugs in walkways, pet beds, electrical cords, etc? Yes  Adequate lighting in your home to reduce risk of falls? Yes   ASSISTIVE DEVICES UTILIZED TO PREVENT FALLS:  Life alert? No  Use of a cane, walker or w/c? Yes  Grab bars in the  bathroom? Yes  Shower chair or bench in shower? Yes  Elevated toilet seat or a handicapped toilet? Yes   TIMED UP AND GO:  Was the test performed? No .  Length of time to ambulate 10 feet: not performed, telehealth    Cognitive Function: MMSE - Mini Mental State Exam 04/25/2019 04/25/2019  Orientation to time 3 3  Orientation to Place 5 5  Attention/ Calculation 4 -  Recall 0 -     6CIT Screen 05/05/2021 05/04/2020 04/25/2019  What Year? 4 points 4 points 4 points  What month? 0 points 0 points 0 points  What time? 3 points 0 points 3 points  Count back from 20 0 points 0 points 2 points  Months in reverse 4 points 4 points 4 points  Repeat phrase 6 points 10 points 10 points  Total Score 17 18 23     Immunizations Immunization History  Administered Date(s) Administered   Fluad Quad(high Dose 65+) 03/06/2019, 03/05/2020, 03/10/2021   Influenza Whole 02/13/2009   Influenza, High Dose Seasonal PF 03/28/2014, 02/12/2018   Influenza,inj,Quad PF,6+ Mos 02/18/2013, 02/07/2015   Influenza-Unspecified 02/10/2016, 02/12/2018, 03/10/2021   PFIZER(Purple Top)SARS-COV-2 Vaccination 07/14/2019, 08/09/2019   Pneumococcal Conjugate-13 03/25/2014, 02/22/2017   Pneumococcal Polysaccharide-23 03/30/2015   Tdap 01/31/2012    TDAP status: Up to date  Flu Vaccine status: Up to date  Pneumococcal  vaccine status: Up to date  Covid-19 vaccine status: Completed vaccines  Qualifies for Shingles Vaccine? Yes   Zostavax completed No   Shingrix Completed?: No.    Education has been provided regarding the importance of this vaccine. Patient has been advised to call insurance company to determine out of pocket expense if they have not yet received this vaccine. Advised may also receive vaccine at local pharmacy or Health Dept. Verbalized acceptance and understanding.  Screening Tests Health Maintenance  Topic Date Due   Zoster Vaccines- Shingrix (1 of 2) Never done   COVID-19 Vaccine (3 - Pfizer risk series) 09/06/2019   TETANUS/TDAP  01/30/2022   Pneumonia Vaccine 81+ Years old  Completed   INFLUENZA VACCINE  Completed   HPV VACCINES  Aged Out    Health Maintenance  Health Maintenance Due  Topic Date Due   Zoster Vaccines- Shingrix (1 of 2) Never done   COVID-19 Vaccine (3 - Pfizer risk series) 09/06/2019    Colorectal cancer screening: No longer required.   Lung Cancer Screening: (Low Dose CT Chest recommended if Age 67-80 years, 30 pack-year currently smoking OR have quit w/in 15years.) does not qualify.   Lung Cancer Screening Referral: n/a  Additional Screening:  Hepatitis C Screening: does qualify; Patient declined screening.   Vision Screening: Recommended annual ophthalmology exams for early detection of glaucoma and other disorders of the eye. Is the patient up to date with their annual eye exam?  No  Who is the provider or what is the name of the office in which the patient attends annual eye exams?  If pt is not established with a provider, would they like to be referred to a provider to establish care? No .   Dental Screening: Recommended annual dental exams for proper oral hygiene  Community Resource Referral / Chronic Care Management: CRR required this visit?  No   CCM required this visit?  No      Plan:  -Continue current medication  regimen. -Continue to use walker for ambulation. -Follow up in 6 months for HTN, Vit D, HLD and FBW  I have personally reviewed  and noted the following in the patients chart:   Medical and social history Use of alcohol, tobacco or illicit drugs  Current medications and supplements including opioid prescriptions. Patient is not currently taking opioid prescriptions. Functional ability and status Nutritional status Physical activity Advanced directives List of other physicians Hospitalizations, surgeries, and ER visits in previous 12 months Vitals Screenings to include cognitive, depression, and falls Referrals and appointments  In addition, I have reviewed and discussed with patient certain preventive protocols, quality metrics, and best practice recommendations. A written personalized care plan for preventive services as well as general preventive health recommendations were provided to patient.    Lorrene Reid, PA-C   05/05/2021

## 2021-05-05 NOTE — Patient Instructions (Signed)
Preventive Care 65 Years and Older, Male °Preventive care refers to lifestyle choices and visits with your health care provider that can promote health and wellness. Preventive care visits are also called wellness exams. °What can I expect for my preventive care visit? °Counseling °During your preventive care visit, your health care provider may ask about your: °Medical history, including: °Past medical problems. °Family medical history. °History of falls. °Current health, including: °Emotional well-being. °Home life and relationship well-being. °Sexual activity. °Memory and ability to understand (cognition). °Lifestyle, including: °Alcohol, nicotine or tobacco, and drug use. °Access to firearms. °Diet, exercise, and sleep habits. °Work and work environment. °Sunscreen use. °Safety issues such as seatbelt and bike helmet use. °Physical exam °Your health care provider will check your: °Height and weight. These may be used to calculate your BMI (body mass index). BMI is a measurement that tells if you are at a healthy weight. °Waist circumference. This measures the distance around your waistline. This measurement also tells if you are at a healthy weight and may help predict your risk of certain diseases, such as type 2 diabetes and high blood pressure. °Heart rate and blood pressure. °Body temperature. °Skin for abnormal spots. °What immunizations do I need? °Vaccines are usually given at various ages, according to a schedule. Your health care provider will recommend vaccines for you based on your age, medical history, and lifestyle or other factors, such as travel or where you work. °What tests do I need? °Screening °Your health care provider may recommend screening tests for certain conditions. This may include: °Lipid and cholesterol levels. °Diabetes screening. This is done by checking your blood sugar (glucose) after you have not eaten for a while (fasting). °Hepatitis C test. °Hepatitis B test. °HIV (human  immunodeficiency virus) test. °STI (sexually transmitted infection) testing, if you are at risk. °Lung cancer screening. °Colorectal cancer screening. °Prostate cancer screening. °Abdominal aortic aneurysm (AAA) screening. You may need this if you are a current or former smoker. °Talk with your health care provider about your test results, treatment options, and if necessary, the need for more tests. °Follow these instructions at home: °Eating and drinking ° °Eat a diet that includes fresh fruits and vegetables, whole grains, lean protein, and low-fat dairy products. Limit your intake of foods with high amounts of sugar, saturated fats, and salt. °Take vitamin and mineral supplements as recommended by your health care provider. °Do not drink alcohol if your health care provider tells you not to drink. °If you drink alcohol: °Limit how much you have to 0-2 drinks a day. °Know how much alcohol is in your drink. In the [place] one drink equals one 12 oz bottle of beer (355 mL), one 5 oz glass of wine (148 mL), or one 1½ oz glass of hard liquor (44 mL). °Lifestyle °Brush your teeth every morning and night with fluoride toothpaste. Floss one time each day. °Exercise for at least 30 minutes 5 or more days each week. °Do not use any products that contain nicotine or tobacco. These products include cigarettes, chewing tobacco, and vaping devices, such as e-cigarettes. If you need help quitting, ask your health care provider. °Do not use drugs. °If you are sexually active, practice safe sex. Use a condom or other form of protection to prevent STIs. °Take aspirin only as told by your health care provider. Make sure that you understand how much to take and what form to take. Work with your health care provider to find out whether it is safe and   beneficial for you to take aspirin daily. °Ask your health care provider if you need to take a cholesterol-lowering medicine (statin). °Find healthy ways to manage stress, such  as: °Meditation, yoga, or listening to music. °Journaling. °Talking to a trusted person. °Spending time with friends and family. °Safety °Always wear your seat belt while driving or riding in a vehicle. °Do not drive: °If you have been drinking alcohol. Do not ride with someone who has been drinking. °When you are tired or distracted. °While texting. °If you have been using any mind-altering substances or drugs. °Wear a helmet and other protective equipment during sports activities. °If you have firearms in your house, make sure you follow all gun safety procedures. °Minimize exposure to UV radiation to reduce your risk of skin cancer. °What's next? °Visit your health care provider once a year for an annual wellness visit. °Ask your health care provider how often you should have your eyes and teeth checked. °Stay up to date on all vaccines. °This information is not intended to replace advice given to you by your health care provider. Make sure you discuss any questions you have with your health care provider. °Document Revised: 10/28/2020 Document Reviewed: 10/28/2020 °[employer]vier Patient Education © 2022 [employer] ° °

## 2021-06-05 ENCOUNTER — Other Ambulatory Visit: Payer: Self-pay | Admitting: Nurse Practitioner

## 2021-06-05 DIAGNOSIS — J069 Acute upper respiratory infection, unspecified: Secondary | ICD-10-CM

## 2021-06-05 MED ORDER — MOLNUPIRAVIR EUA 200MG CAPSULE: 4.0000 | ORAL_CAPSULE | Freq: Two times a day (BID) | ORAL | 0 refills | Status: AC

## 2021-06-07 ENCOUNTER — Ambulatory Visit (INDEPENDENT_AMBULATORY_CARE_PROVIDER_SITE_OTHER): Payer: Medicare HMO | Admitting: Physician Assistant

## 2021-06-07 ENCOUNTER — Encounter: Payer: Self-pay | Admitting: Physician Assistant

## 2021-06-07 ENCOUNTER — Other Ambulatory Visit: Payer: Self-pay

## 2021-06-07 VITALS — Wt 170.0 lb

## 2021-06-07 DIAGNOSIS — U071 COVID-19: Secondary | ICD-10-CM | POA: Diagnosis not present

## 2021-06-07 DIAGNOSIS — J069 Acute upper respiratory infection, unspecified: Secondary | ICD-10-CM | POA: Diagnosis not present

## 2021-06-07 MED ORDER — METHYLPREDNISOLONE 4 MG PO TBPK: ORAL_TABLET | ORAL | 0 refills | Status: DC

## 2021-06-07 NOTE — Patient Instructions (Signed)
COVID-19: Quarantine and Isolation °Quarantine °If you were exposed °Quarantine and stay away from others when you have been in close contact with someone who has COVID-19. °Isolate °If you are sick or test positive °Isolate when you are sick or when you have COVID-19, even if you don't have symptoms. °When to stay home °Calculating quarantine °The date of your exposure is considered day 0. Day 1 is the first full day after your last contact with a person who has had COVID-19. Stay home and away from other people for at least 5 days. Learn why CDC updated guidance for the general public. °IF YOU were exposed to COVID-19 and are NOT  °up to dateIF YOU were exposed to COVID-19 and are NOT on COVID-19 vaccinations °Quarantine for at least 5 days °Stay home °Stay home and quarantine for at least 5 full days. °Wear a well-fitting mask if you must be around others in your home. °Do not travel. °Get tested °Even if you don't develop symptoms, get tested at least 5 days after you last had close contact with someone with COVID-19. °After quarantine °Watch for symptoms °Watch for symptoms until 10 days after you last had close contact with someone with COVID-19. °Avoid travel °It is best to avoid travel until a full 10 days after you last had close contact with someone with COVID-19. °If you develop symptoms °Isolate immediately and get tested. Continue to stay home until you know the results. Wear a well-fitting mask around others. °Take precautions until day 10 °Wear a well-fitting mask °Wear a well-fitting mask for 10 full days any time you are around others inside your home or in public. Do not go to places where you are unable to wear a well-fitting mask. °If you must travel during days 6-10, take precautions. °Avoid being around people who are more likely to get very sick from COVID-19. °IF YOU were exposed to COVID-19 and are  °up to dateIF YOU were exposed to COVID-19 and are on COVID-19 vaccinations °No  quarantine °You do not need to stay home unless you develop symptoms. °Get tested °Even if you don't develop symptoms, get tested at least 5 days after you last had close contact with someone with COVID-19. °Watch for symptoms °Watch for symptoms until 10 days after you last had close contact with someone with COVID-19. °If you develop symptoms °Isolate immediately and get tested. Continue to stay home until you know the results. Wear a well-fitting mask around others. °Take precautions until day 10 °Wear a well-fitting mask °Wear a well-fitting mask for 10 full days any time you are around others inside your home or in public. Do not go to places where you are unable to wear a well-fitting mask. °Take precautions if traveling °Avoid being around people who are more likely to get very sick from COVID-19. °IF YOU were exposed to COVID-19 and had confirmed COVID-19 within the past 90 days (you tested positive using a viral test) °No quarantine °You do not need to stay home unless you develop symptoms. °Watch for symptoms °Watch for symptoms until 10 days after you last had close contact with someone with COVID-19. °If you develop symptoms °Isolate immediately and get tested. Continue to stay home until you know the results. Wear a well-fitting mask around others. °Take precautions until day 10 °Wear a well-fitting mask °Wear a well-fitting mask for 10 full days any time you are around others inside your home or in public. Do not go to places where you are   unable to wear a well-fitting mask. °Take precautions if traveling °Avoid being around people who are more likely to get very sick from COVID-19. °Calculating isolation °Day 0 is your first day of symptoms or a positive viral test. Day 1 is the first full day after your symptoms developed or your test specimen was collected. If you have COVID-19 or have symptoms, isolate for at least 5 days. °IF YOU tested positive for COVID-19 or have symptoms, regardless of  vaccination status °Stay home for at least 5 days °Stay home for 5 days and isolate from others in your home. °Wear a well-fitting mask if you must be around others in your home. °Do not travel. °Ending isolation if you had symptoms °End isolation after 5 full days if you are fever-free for 24 hours (without the use of fever-reducing medication) and your symptoms are improving. °Ending isolation if you did NOT have symptoms °End isolation after at least 5 full days after your positive test. °If you got very sick from COVID-19 or have a weakened immune system °You should isolate for at least 10 days. Consult your doctor before ending isolation. °Take precautions until day 10 °Wear a well-fitting mask °Wear a well-fitting mask for 10 full days any time you are around others inside your home or in public. Do not go to places where you are unable to wear a well-fitting mask. °Do not travel °Do not travel until a full 10 days after your symptoms started or the date your positive test was taken if you had no symptoms. °Avoid being around people who are more likely to get very sick from COVID-19. °Definitions °Exposure °Contact with someone infected with SARS-CoV-2, the virus that causes COVID-19, in a way that increases the likelihood of getting infected with the virus. °Close contact °A close contact is someone who was less than 6 feet away from an infected person (laboratory-confirmed or a clinical diagnosis) for a cumulative total of 15 minutes or more over a 24-hour period. For example, three individual 5-minute exposures for a total of 15 minutes. People who are exposed to someone with COVID-19 after they completed at least 5 days of isolation are not considered close contacts. °Quarantine °Quarantine is a strategy used to prevent transmission of COVID-19 by keeping people who have been in close contact with someone with COVID-19 apart from others. °Who does not need to quarantine? °If you had close contact with  someone with COVID-19 and you are in one of the following groups, you do not need to quarantine. °You are up to date with your COVID-19 vaccines. °You had confirmed COVID-19 within the last 90 days (meaning you tested positive using a viral test). °If you are up to date with COVID-19 vaccines, you should wear a well-fitting mask around others for 10 days from the date of your last close contact with someone with COVID-19 (the date of last close contact is considered day 0). Get tested at least 5 days after you last had close contact with someone with COVID-19. If you test positive or develop COVID-19 symptoms, isolate from other people and follow recommendations in the Isolation section below. If you tested positive for COVID-19 with a viral test within the previous 90 days and subsequently recovered and remain without COVID-19 symptoms, you do not need to quarantine or get tested after close contact. You should wear a well-fitting mask around others for 10 days from the date of your last close contact with someone with COVID-19 (the date of last   close contact is considered day 0). If you have COVID-19 symptoms, get tested and isolate from other people and follow recommendations in the Isolation section below. °Who should quarantine? °If you come into close contact with someone with COVID-19, you should quarantine if you are not up to date on COVID-19 vaccines. This includes people who are not vaccinated. °What to do for quarantine °Stay home and away from other people for at least 5 days (day 0 through day 5) after your last contact with a person who has COVID-19. The date of your exposure is considered day 0. Wear a well-fitting mask when around others at home, if possible. °For 10 days after your last close contact with someone with COVID-19, watch for fever (100.4°F or greater), cough, shortness of breath, or other COVID-19 symptoms. °If you develop symptoms, get tested immediately and isolate until you receive  your test results. If you test positive, follow isolation recommendations. °If you do not develop symptoms, get tested at least 5 days after you last had close contact with someone with COVID-19. °If you test negative, you can leave your home, but continue to wear a well-fitting mask when around others at home and in public until 10 days after your last close contact with someone with COVID-19. °If you test positive, you should isolate for at least 5 days from the date of your positive test (if you do not have symptoms). If you do develop COVID-19 symptoms, isolate for at least 5 days from the date your symptoms began (the date the symptoms started is day 0). Follow recommendations in the isolation section below. °If you are unable to get a test 5 days after last close contact with someone with COVID-19, you can leave your home after day 5 if you have been without COVID-19 symptoms throughout the 5-day period. Wear a well-fitting mask for 10 days after your date of last close contact when around others at home and in public. °Avoid people who are have weakened immune systems or are more likely to get very sick from COVID-19, and nursing homes and other high-risk settings, until after at least 10 days. °If possible, stay away from people you live with, especially people who are at higher risk for getting very sick from COVID-19, as well as others outside your home throughout the full 10 days after your last close contact with someone with COVID-19. °If you are unable to quarantine, you should wear a well-fitting mask for 10 days when around others at home and in public. °If you are unable to wear a mask when around others, you should continue to quarantine for 10 days. Avoid people who have weakened immune systems or are more likely to get very sick from COVID-19, and nursing homes and other high-risk settings, until after at least 10 days. °See additional information about travel. °Do not go to places where you are  unable to wear a mask, such as restaurants and some gyms, and avoid eating around others at home and at work until after 10 days after your last close contact with someone with COVID-19. °After quarantine °Watch for symptoms until 10 days after your last close contact with someone with COVID-19. °If you have symptoms, isolate immediately and get tested. °Quarantine in high-risk congregate settings °In certain congregate settings that have high risk of secondary transmission (such as correctional and detention facilities, homeless shelters, or cruise ships), CDC recommends a 10-day quarantine for residents, regardless of vaccination and booster status. During periods of critical staffing   shortages, facilities may consider shortening the quarantine period for staff to ensure continuity of operations. Decisions to shorten quarantine in these settings should be made in consultation with state, local, tribal, or territorial health departments and should take into consideration the context and characteristics of the facility. CDC's setting-specific guidance provides additional recommendations for these settings. °Isolation °Isolation is used to separate people with confirmed or suspected COVID-19 from those without COVID-19. People who are in isolation should stay home until it's safe for them to be around others. At home, anyone sick or infected should separate from others, or wear a well-fitting mask when they need to be around others. People in isolation should stay in a specific "sick room" or area and use a separate bathroom if available. Everyone who has presumed or confirmed COVID-19 should stay home and isolate from other people for at least 5 full days (day 0 is the first day of symptoms or the date of the day of the positive viral test for asymptomatic persons). They should wear a mask when around others at home and in public for an additional 5 days. People who are confirmed to have COVID-19 or are showing  symptoms of COVID-19 need to isolate regardless of their vaccination status. This includes: °People who have a positive viral test for COVID-19, regardless of whether or not they have symptoms. °People with symptoms of COVID-19, including people who are awaiting test results or have not been tested. People with symptoms should isolate even if they do not know if they have been in close contact with someone with COVID-19. °What to do for isolation °Monitor your symptoms. If you have an emergency warning sign (including trouble breathing), seek emergency medical care immediately. °Stay in a separate room from other household members, if possible. °Use a separate bathroom, if possible. °Take steps to improve ventilation at home, if possible. °Avoid contact with other members of the household and pets. °Don't share personal household items, like cups, towels, and utensils. °Wear a well-fitting mask when you need to be around other people. °Learn more about what to do if you are sick and how to notify your contacts. °Ending isolation for people who had COVID-19 and had symptoms °If you had COVID-19 and had symptoms, isolate for at least 5 days. To calculate your 5-day isolation period, day 0 is your first day of symptoms. Day 1 is the first full day after your symptoms developed. You can leave isolation after 5 full days. °You can end isolation after 5 full days if you are fever-free for 24 hours without the use of fever-reducing medication and your other symptoms have improved (Loss of taste and smell may persist for weeks or months after recovery and need not delay the end of isolation). °You should continue to wear a well-fitting mask around others at home and in public for 5 additional days (day 6 through day 10) after the end of your 5-day isolation period. If you are unable to wear a mask when around others, you should continue to isolate for a full 10 days. Avoid people who have weakened immune systems or are more  likely to get very sick from COVID-19, and nursing homes and other high-risk settings, until after at least 10 days. °If you continue to have fever or your other symptoms have not improved after 5 days of isolation, you should wait to end your isolation until you are fever-free for 24 hours without the use of fever-reducing medication and your other symptoms have improved.   Continue to wear a well-fitting mask through day 10. Contact your healthcare provider if you have questions. °See additional information about travel. °Do not go to places where you are unable to wear a mask, such as restaurants and some gyms, and avoid eating around others at home and at work until a full 10 days after your first day of symptoms. °If an individual has access to a test and wants to test, the best approach is to use an antigen test1 towards the end of the 5-day isolation period. Collect the test sample only if you are fever-free for 24 hours without the use of fever-reducing medication and your other symptoms have improved (loss of taste and smell may persist for weeks or months after recovery and need not delay the end of isolation). If your test result is positive, you should continue to isolate until day 10. If your test result is negative, you can end isolation, but continue to wear a well-fitting mask around others at home and in public until day 10. Follow additional recommendations for masking and avoiding travel as described above. °1As noted in the labeling for authorized over-the counter antigen tests: Negative results should be treated as presumptive. Negative results do not rule out SARS-CoV-2 infection and should not be used as the sole basis for treatment or patient management decisions, including infection control decisions. To improve results, antigen tests should be used twice over a three-day period with at least 24 hours and no more than 48 hours between tests. °Note that these recommendations on ending isolation  do not apply to people who are moderately ill or very sick from COVID-19 or have weakened immune systems. See section below for recommendations for when to end isolation for these groups. °Ending isolation for people who tested positive for COVID-19 but had no symptoms °If you test positive for COVID-19 and never develop symptoms, isolate for at least 5 days. Day 0 is the day of your positive viral test (based on the date you were tested) and day 1 is the first full day after the specimen was collected for your positive test. You can leave isolation after 5 full days. °If you continue to have no symptoms, you can end isolation after at least 5 days. °You should continue to wear a well-fitting mask around others at home and in public until day 10 (day 6 through day 10). If you are unable to wear a mask when around others, you should continue to isolate for 10 days. Avoid people who have weakened immune systems or are more likely to get very sick from COVID-19, and nursing homes and other high-risk settings, until after at least 10 days. °If you develop symptoms after testing positive, your 5-day isolation period should start over. Day 0 is your first day of symptoms. Follow the recommendations above for ending isolation for people who had COVID-19 and had symptoms. °See additional information about travel. °Do not go to places where you are unable to wear a mask, such as restaurants and some gyms, and avoid eating around others at home and at work until 10 days after the day of your positive test. °If an individual has access to a test and wants to test, the best approach is to use an antigen test1 towards the end of the 5-day isolation period. If your test result is positive, you should continue to isolate until day 10. If your test result is positive, you can also choose to test daily and if your test result   is negative, you can end isolation, but continue to wear a well-fitting mask around others at home and in  public until day 10. Follow additional recommendations for masking and avoiding travel as described above. °1As noted in the labeling for authorized over-the counter antigen tests: Negative results should be treated as presumptive. Negative results do not rule out SARS-CoV-2 infection and should not be used as the sole basis for treatment or patient management decisions, including infection control decisions. To improve results, antigen tests should be used twice over a three-day period with at least 24 hours and no more than 48 hours between tests. °Ending isolation for people who were moderately or very sick from COVID-19 or have a weakened immune system °People who are moderately ill from COVID-19 (experiencing symptoms that affect the lungs like shortness of breath or difficulty breathing) should isolate for 10 days and follow all other isolation precautions. To calculate your 10-day isolation period, day 0 is your first day of symptoms. Day 1 is the first full day after your symptoms developed. If you are unsure if your symptoms are moderate, talk to a healthcare provider for further guidance. °People who are very sick from COVID-19 (this means people who were hospitalized or required intensive care or ventilation support) and people who have weakened immune systems might need to isolate at home longer. They may also require testing with a viral test to determine when they can be around others. CDC recommends an isolation period of at least 10 and up to 20 days for people who were very sick from COVID-19 and for people with weakened immune systems. Consult with your healthcare provider about when you can resume being around other people. If you are unsure if your symptoms are severe or if you have a weakened immune system, talk to a healthcare provider for further guidance. °People who have a weakened immune system should talk to their healthcare provider about the potential for reduced immune responses to  COVID-19 vaccines and the need to continue to follow current prevention measures (including wearing a well-fitting mask and avoiding crowds and poorly ventilated indoor spaces) to protect themselves against COVID-19 until advised otherwise by their healthcare provider. Close contacts of immunocompromised people--including household members--should also be encouraged to receive all recommended COVID-19 vaccine doses to help protect these people. °Isolation in high-risk congregate settings °In certain high-risk congregate settings that have high risk of secondary transmission and where it is not feasible to cohort people (such as correctional and detention facilities, homeless shelters, and cruise ships), CDC recommends a 10-day isolation period for residents. During periods of critical staffing shortages, facilities may consider shortening the isolation period for staff to ensure continuity of operations. Decisions to shorten isolation in these settings should be made in consultation with state, local, tribal, or territorial health departments and should take into consideration the context and characteristics of the facility. CDC's setting-specific guidance provides additional recommendations for these settings. °This CDC guidance is meant to supplement--not replace--any federal, state, local, territorial, or tribal health and safety laws, rules, and regulations. °Recommendations for specific settings °These recommendations do not apply to healthcare professionals. For guidance specific to these settings, see °Healthcare professionals: Interim Guidance for Managing Healthcare Personnel with SARS-CoV-2 Infection or Exposure to SARS-CoV-2 °Patients, residents, and visitors to healthcare settings: Interim Infection Prevention and Control Recommendations for Healthcare Personnel During the Coronavirus Disease 2019 (COVID-19) Pandemic °Additional setting-specific guidance and recommendations are available. °These  recommendations on quarantine and isolation do apply to [employer] School   settings. Additional guidance is available here: Overview of COVID-19 Quarantine for K-12 Schools °Travelers: Travel information and recommendations °Congregate facilities and other settings: guidance pages for community, work, and school settings °Ongoing COVID-19 exposure FAQs °I live with someone with COVID-19, but I cannot be separated from them. How do we manage quarantine in this situation? °It is very important for people with COVID-19 to remain apart from other people, if possible, even if they are living together. If separation of the person with COVID-19 from others that they live with is not possible, the other people that they live with will have ongoing exposure, meaning they will be repeatedly exposed until that person is no longer able to spread the virus to other people. In this situation, there are precautions you can take to limit the spread of COVID-19: °The person with COVID-19 and everyone they live with should wear a well-fitting mask inside the home. °If possible, one person should care for the person with COVID-19 to limit the number of people who are in close contact with the infected person. °Take steps to protect yourself and others to reduce transmission in the home: °Quarantine if you are not up to date with your COVID-19 vaccines. °Isolate if you are sick or tested positive for COVID-19, even if you don't have symptoms. °Learn more about the public health recommendations for testing, mask use and quarantine of close contacts, like yourself, who have ongoing exposure. These recommendations differ depending on your vaccination status. °What should I do if I have ongoing exposure to COVID-19 from someone I live with? °Recommendations for this situation depend on your vaccination status: °If you are not up to date on COVID-19 vaccines and have ongoing exposure to COVID-19, you should: °Begin quarantine immediately and  continue to quarantine throughout the isolation period of the person with COVID-19. °Continue to quarantine for an additional 5 days starting the day after the end of isolation for the person with COVID-19. °Get tested at least 5 days after the end of isolation of the infected person that lives with them. °If you test negative, you can leave the home but should continue to wear a well-fitting mask when around others at home and in public until 10 days after the end of isolation for the person with COVID-19. °Isolate immediately if you develop symptoms of COVID-19 or test positive. °If you are up to date with COVID-19 vaccines and have ongoing exposure to COVID-19, you should: °Get tested at least 5 days after your first exposure. A person with COVID-19 is considered infectious starting 2 days before they develop symptoms, or 2 days before the date of their positive test if they do not have symptoms. °Get tested again at least 5 days after the end of isolation for the person with COVID-19. °Wear a well-fitting mask when you are around the person with COVID-19, and do this throughout their isolation period. °Wear a well-fitting mask around others for 10 days after the infected person's isolation period ends. °Isolate immediately if you develop symptoms of COVID-19 or test positive. °What should I do if multiple people I live with test positive for COVID-19 at different times? °Recommendations for this situation depend on your vaccination status: °If you are not up to date with your COVID-19 vaccines, you should: °Quarantine throughout the isolation period of any infected person that you live with. °Continue to quarantine until 5 days after the end of isolation date for the most recently infected person that lives with you. For example, if   the last day of isolation of the person most recently infected with COVID-19 was June 30, the new 5-day quarantine period starts on July 1. °Get tested at least 5 days after the end  of isolation for the most recently infected person that lives with you. °Wear a well-fitting mask when you are around any person with COVID-19 while that person is in isolation. °Wear a well-fitting mask when you are around other people until 10 days after your last close contact. °Isolate immediately if you develop symptoms of COVID-19 or test positive. °If you are up to date with your COVID-19 vaccines, you should: °Get tested at least 5 days after your first exposure. A person with COVID-19 is considered infectious starting 2 days before they developed symptoms, or 2 days before the date of their positive test if they do not have symptoms. °Get tested again at least 5 days after the end of isolation for the most recently infected person that lives with you. °Wear a well-fitting mask when you are around any person with COVID-19 while that person is in isolation. °Wear a well-fitting mask around others for 10 days after the end of isolation for the most recently infected person that lives with you. For example, if the last day of isolation for the person most recently infected with COVID-19 was June 30, the new 10-day period to wear a well-fitting mask indoors in public starts on July 1. °Isolate immediately if you develop symptoms of COVID-19 or test positive. °I had COVID-19 and completed isolation. Do I have to quarantine or get tested if someone I live with gets COVID-19 shortly after I completed isolation? °No. If you recently completed isolation and someone that lives with you tests positive for the virus that causes COVID-19 shortly after the end of your isolation period, you do not have to quarantine or get tested as long as you do not develop new symptoms. Once all of the people that live together have completed isolation or quarantine, refer to the guidance below for new exposures to COVID-19. °If you had COVID-19 in the previous 90 days and then came into close contact with someone with COVID-19, you do  not have to quarantine or get tested if you do not have symptoms. But you should: °Wear a well-fitting mask indoors in public for 10 days after your last close contact. °Monitor for COVID-19 symptoms for 10 days from the date of your last close contact. °Isolate immediately and get tested if symptoms develop. °If more than 90 days have passed since your recovery from infection, follow CDC's recommendations for close contacts. These recommendations will differ depending on your vaccination status. °08/12/2020 °Content source: National Center for Immunization and Respiratory Diseases (NCIRD), Division of Viral Diseases °This information is not intended to replace advice given to you by your health care provider. Make sure you discuss any questions you have with your health care provider. °Document Revised: 12/16/2020 Document Reviewed: 12/16/2020 °Elsevier Patient Education © 2022 Elsevier Inc. ° °

## 2021-06-07 NOTE — Progress Notes (Signed)
Telehealth office visit note for Trevor Reid, PA-C- at Primary Care at [facility]   I connected with current patient today by telephone and verified that I am speaking with the correct person    Location of the patient: Home  Location of the provider: Office - This visit type was conducted due to national recommendations for restrictions regarding the COVID-19 Pandemic (e.g. social distancing) in an effort to limit this patient's exposure and mitigate transmission in our community.    - No physical exam could be performed with this format, beyond that communicated to [place] by the patient/ family members as noted.   - Additionally my office staff/ schedulers were to discuss with the patient that there may be a monetary charge related to this service, depending on their medical insurance.  My understanding is that patient understood and consented to proceed.     _________________________________________________________________________________   History of Present Illness: Patient calls in with c/o Covid-19 infection. Patient's son [name] is on the line. Patient tested positive Saturday (2 days ago) which is when he also started molnupiravir. Patient complains of hoarseness, sore throat and fatigue. Does not feel well. No body aches, cough, headache, chest pain or shortness of breath.     No flowsheet data found.  Depression screen [facility] 2/9 05/05/2021 05/04/2020 03/05/2020 11/04/2019 04/25/2019  Decreased Interest 0 3 0 2 0  Down, Depressed, Hopeless 0 1 0 0 0  PHQ - 2 Score 0 4 0 2 0  Altered sleeping 0 1 0 3 1  Tired, decreased energy 0 3 0 0 1  Change in appetite 0 0 0 0 0  Feeling bad or failure about yourself  0 0 0 0 0  Trouble concentrating 0 0 0 0 0  Moving slowly or fidgety/restless 0 0 0 0 0  Suicidal thoughts 0 0 0 0 0  PHQ-9 Score 0 8 0 5 2  Difficult doing work/chores Not difficult at all Somewhat difficult - Not difficult at all Not difficult at all  Some  recent data might be hidden      Impression and Recommendations:     1. Upper respiratory tract infection due to COVID-19 virus     Discussed with patient and son to continue with oral molnupiravir and home supportive care. Will start corticosteroid therapy to help improve symptoms. Discussed latest CDC isolation guidelines. Monitor for worsening symptoms and if having severe symptoms such as shortness of breath or altered mental status recommend seeking immediate medical care.   - As part of my medical decision making, I reviewed the following data within the [place] History obtained from pt /family, CMA notes reviewed and incorporated if applicable, Labs reviewed, Radiograph/ tests reviewed if applicable and OV notes from prior OV's with me, as well as any other specialists she/he has seen since seeing me last, were all reviewed and used in my medical decision making process today.    - Additionally, when appropriate, discussion had with patient regarding our treatment plan, and their biases/concerns about that plan were used in my medical decision making today.    - The patient agreed with the plan and demonstrated an understanding of the instructions.   No barriers to understanding were identified.     - The patient was advised to call back or seek an in-person evaluation if the symptoms worsen or if the condition fails to improve as anticipated.   Return if symptoms worsen or fail to  improve.    No orders of the defined types were placed in this encounter.   Meds ordered this encounter  Medications   methylPREDNISolone (MEDROL DOSEPAK) 4 MG TBPK tablet    Sig: Take as directed on package    Dispense:  21 tablet    Refill:  0    There are no discontinued medications.     Time spent on telephone encounter was 5 minutes.      The [place] was signed into law in 2016 which includes the topic of electronic health records.  This provides  immediate access to information in MyChart.  This includes consultation notes, operative notes, office notes, lab results and pathology reports.  If you have any questions about what you read please let us know at your next visit or call us at the office.  We are right here with you.   __________________________________________________________________________________     Patient Care Team    Relationship Specialty Notifications Start End  Trevor Galloway, [place] PCP - General   09/15/19   Rana Snare, MD (Inactive) Consulting Physician Urology  03/30/15    Comment: does not follow regularly with this doc  Sherlynn Stalls, MD Consulting Physician Ophthalmology  03/30/15    Comment: does not follow regularly with this doc  Druscilla Brownie, MD Consulting Physician Dermatology  01/01/16   Dorna Leitz, MD Consulting Physician Orthopedic Surgery  09/26/16   Tat, Eustace Quail, DO Consulting Physician Neurology  09/26/16    Comment: does not follow regularly with this doc  Thompson Grayer, MD Consulting Physician Cardiology  09/26/16    Comment: does not follow regularly with this doc  Ceasar Mons, MD Consulting Physician Urology  07/10/17   Darius Bump, [facility] Pharmacist Pharmacist  01/13/21    Comment: CCM INITIAL PHONE VISIT     -Vitals obtained; medications/ allergies reconciled;  personal medical, social, Sx etc.histories were updated by CMA, reviewed by me and are reflected in chart   Patient Active Problem List   Diagnosis Date Noted   Decreased transfer ability 04/28/2019   Impaired ambulation 04/28/2019   At high risk for falls 04/28/2019   History of non anemic vitamin B12 deficiency 11/23/2017   Cancer ([place]) 07/10/2017   Elevated TSH 01/31/2017   Fatigue 01/31/2017   Hypertriglyceridemia 01/31/2017   Low level of high density lipoprotein (HDL) 01/31/2017   Elevated LDL cholesterol level 01/31/2017   History of cardiac radiofrequency ablation 09/26/2016   Impaired  memory 09/26/2016   Vitamin D deficiency 04/19/2016   Vision problems 01/01/2016   Chronic venous stasis dermatitis of both lower extremities 01/01/2016   Glucose intolerance (impaired glucose tolerance) 01/01/2016   LVH (left ventricular hypertrophy) 49/70/2637   Diastolic dysfunction- echo shows L grade I diastolic ventricular dysfunction 01/01/2016   H/O prostate cancer 10/04/2015   h/o Shuffling gait due to OA knees/body 09/16/2013   Frequent falls 06/17/2013   Chronic fatigue and malaise 04/09/2013   Essential hypertension 11/28/2012   h/o Depression- not current 11/28/2012   chronic B/L Peripheral Edema 11/13/2012   Allergic rhinitis 08/15/2012   Dehydration 05/18/2012   General medical examination 01/03/2011   Mixed hyperlipidemia 07/05/2010   HEMATOCHEZIA 07/05/2010   RIGHT BUNDLE BRANCH BLOCK 12/25/2009   TACHYCARDIA 12/25/2009   Atrial flutter ([place]) 12/24/2009     Current Meds  Medication Sig   Ascorbic Acid (VITAMIN C) 1000 MG tablet Take 1,000 mg by mouth daily.   aspirin 81 MG tablet Take 81 mg  by mouth daily.   hydrochlorothiazide (HYDRODIURIL) 12.5 MG tablet TAKE 1 TABLET BY MOUTH EVERY DAY(NEED APPOINTMENT FOR REFILLS)   methylPREDNISolone (MEDROL DOSEPAK) 4 MG TBPK tablet Take as directed on package   metoprolol succinate (TOPROL-XL) 50 MG 24 hr tablet TAKE 1 TABLET BY MOUTH EVERY DAY WITH OR IMMEDIATELY FOLLOWING A MEAL   molnupiravir EUA (LAGEVRIO) 200 mg CAPS capsule Take 4 capsules (800 mg total) by mouth 2 (two) times daily for 5 days.   Multiple Vitamins-Minerals (PRESERVISION AREDS 2) CAPS Take 1 capsule by mouth 2 (two) times daily.   Omega-3 Fatty Acids (FISH OIL) 1000 MG CAPS Take 3 capsules (3,000 mg total) by mouth daily.   Vitamin D, Ergocalciferol, (DRISDOL) 1.25 MG (50000 UNIT) CAPS capsule TAKE 1 CAPSULE EVERY WEDNESDAY AND SUNDAY     Allergies:  No Known Allergies   ROS:  See above HPI for pertinent positives and negatives   Objective:    Weight 170 lb (77.1 kg).  (if some vitals are omitted, this means that patient was UNABLE to obtain them. ) General: A & O * 3; sounds in no acute distress; in usual state of health.  Respiratory: speaking in full sentences, no conversational dyspnea Psych: insight appears good, mood- appears appropriate

## 2021-06-30 ENCOUNTER — Other Ambulatory Visit: Payer: Self-pay | Admitting: Physician Assistant

## 2021-06-30 DIAGNOSIS — E559 Vitamin D deficiency, unspecified: Secondary | ICD-10-CM

## 2021-09-01 DIAGNOSIS — M19212 Secondary osteoarthritis, left shoulder: Secondary | ICD-10-CM | POA: Diagnosis not present

## 2021-09-01 DIAGNOSIS — M75122 Complete rotator cuff tear or rupture of left shoulder, not specified as traumatic: Secondary | ICD-10-CM | POA: Diagnosis not present

## 2021-09-21 NOTE — Patient Instructions (Signed)
Vitamin D Deficiency Vitamin D deficiency is when your body does not have enough vitamin D. Vitamin D is important because: It helps your body use certain minerals. It helps to keep your bones healthy. It lessens irritation and swelling (inflammation). It helps the body's defense system (immune system) work better. Not getting enough vitamin D can make your bones soft. What are the causes? Not eating enough foods that have vitamin D in them. Not getting enough sun. Having diseases that make it hard for your body to take in vitamin D. Having had part of your stomach or part of your small intestine taken out. What increases the risk? Being an older adult. Not spending much time outdoors. Living in a long-term care center. Having dark skin. Taking certain medicines. Being overweight or very overweight (obese). Having long-term (chronic) kidney or liver disease. What are the signs or symptoms? In mild cases, there may be no symptoms. If the condition is very bad, symptoms may include: Bone pain. Muscle pain. Not being able to walk normally. Bones that break easily. Joint pain. How is this treated? Treatment may include taking supplements as told by your doctor. Your doctor will tell you what dose is best for you. This may include taking: Vitamin D. Calcium. Follow these instructions at home: Eating and drinking Eat foods that have vitamin D in them, such as: Dairy products, cereals, or juices that have vitamin D added to them (are fortified). Check the label. Fish, such as salmon or trout. Eggs. The vitamin D is in the yolk. Mushrooms that were treated with UV light. Beef liver. The items listed above may not be a complete list of foods and beverages you can eat and drink. Contact a dietitian for more information. General instructions Take over-the-counter and prescription medicines only as told by your doctor. Take supplements only as told by your doctor. Get sunlight in a  safe way. Do not use a tanning bed. Stay at a healthy weight. Lose weight if you need to. Keep all follow-up visits. How is this prevented? Eating foods that naturally have vitamin D in them. Eating or drinking foods and drinks that have vitamin D added to them, such as cereals, juices, and milk. Taking vitamin D or a multivitamin that has vitamin D in it. Being in the sun. Your body makes vitamin D when your skin gets sunlight. Contact a doctor if: Your symptoms do not go away. You feel like you may vomit (nauseous). You vomit. You poop less often than normal, or you have trouble pooping (constipation). Summary Vitamin D deficiency is when your body does not have enough vitamin D. Vitamin D helps to keep your bones healthy. This condition is often treated by taking a supplement. Your doctor will tell you what dose is best for you. This information is not intended to replace advice given to you by your health care provider. Make sure you discuss any questions you have with your health care provider. Document Revised: 02/05/2021 Document Reviewed: 02/05/2021 Elsevier Patient Education  2023 Elsevier Inc.  

## 2021-09-22 ENCOUNTER — Encounter: Payer: Self-pay | Admitting: Physician Assistant

## 2021-09-22 ENCOUNTER — Ambulatory Visit (INDEPENDENT_AMBULATORY_CARE_PROVIDER_SITE_OTHER): Payer: Medicare HMO | Admitting: Physician Assistant

## 2021-09-22 VITALS — BP 138/72 | HR 61 | Temp 97.7°F | Ht 71.0 in | Wt 180.0 lb

## 2021-09-22 DIAGNOSIS — R7302 Impaired glucose tolerance (oral): Secondary | ICD-10-CM

## 2021-09-22 DIAGNOSIS — R7989 Other specified abnormal findings of blood chemistry: Secondary | ICD-10-CM

## 2021-09-22 DIAGNOSIS — E782 Mixed hyperlipidemia: Secondary | ICD-10-CM

## 2021-09-22 DIAGNOSIS — E559 Vitamin D deficiency, unspecified: Secondary | ICD-10-CM

## 2021-09-22 DIAGNOSIS — I10 Essential (primary) hypertension: Secondary | ICD-10-CM

## 2021-09-22 NOTE — Assessment & Plan Note (Signed)
-  Last Vitamin D 40.6, patient on Vit D 50,000 units twice weekly. Will repeat Vit D and adjust medication therapy if indicated.  ?

## 2021-09-22 NOTE — Assessment & Plan Note (Signed)
-  Last lipid panel: HDL 39, LDL 127 ?-Given patient's age and co-morbidities reasonable to continue diet and lifestyle management. Will repeat lipid panel today.  ?

## 2021-09-22 NOTE — Assessment & Plan Note (Signed)
-  Controlled. Continue current medication regimen. Will collect CMP to monitor renal function and electrolytes.  ?

## 2021-09-22 NOTE — Progress Notes (Signed)
?Established patient visit ? ? ?Patient: Trevor Galloway   DOB: 09-03-1926   88 y.o. Male  MRN: [id number] ?Visit Date: 09/22/2021 ? ?Chief Complaint  ?Patient presents with  ? Follow-up  ? ?Subjective  ?  ?HPI  ?Patient presents for chronic follow-up. Patient has no acute concerns. Patient is fasting for labs.  ? ?HTN: Pt denies chest pain, palpitations, dizziness or lower extremity swelling. Taking medication as directed without problems.  ? ?HLD: Pt does not take medication. Has been managing with diet.  ? ?Vitamin D: Reports medication compliance.  ? ? ?Medications: ?Outpatient Medications Prior to Visit  ?Medication Sig  ? Ascorbic Acid (VITAMIN C) 1000 MG tablet Take 1,000 mg by mouth daily.  ? aspirin 81 MG tablet Take 81 mg by mouth daily.  ? hydrochlorothiazide (HYDRODIURIL) 12.5 MG tablet TAKE 1 TABLET BY MOUTH EVERY DAY(NEED APPOINTMENT FOR REFILLS)  ? meloxicam (MOBIC) 15 MG tablet Take 15 mg by mouth daily.  ? metoprolol succinate (TOPROL-XL) 50 MG 24 hr tablet TAKE 1 TABLET BY MOUTH EVERY DAY WITH OR IMMEDIATELY FOLLOWING A MEAL  ? Multiple Vitamins-Minerals (PRESERVISION AREDS 2) CAPS Take 1 capsule by mouth 2 (two) times daily.  ? Omega-3 Fatty Acids (FISH OIL) 1000 MG CAPS Take 3 capsules (3,000 mg total) by mouth daily.  ? Vitamin D, Ergocalciferol, (DRISDOL) 1.25 MG (50000 UNIT) CAPS capsule TAKE 1 CAPSULE BY MOUTH EVERY WEDNESDAY AND SUNDAY  ? [DISCONTINUED] methylPREDNISolone (MEDROL DOSEPAK) 4 MG TBPK tablet Take as directed on package  ? ?No facility-administered medications prior to visit.  ? ? ?Review of Systems ?Review of Systems:  ?A fourteen system review of systems was performed and found to be positive as per HPI. ? ? ?  Objective  ?  ?BP 138/72   Pulse 61   Temp 97.7 ?F (36.5 ?C)   Ht 5' 11"  (1.803 m)   Wt 180 lb (81.6 kg)   SpO2 96%   BMI 25.10 kg/m?  ?BP Readings from Last 3 Encounters:  ?09/22/21 138/72  ?05/05/21 (!) 146/68  ?11/26/20 124/74  ? ?Wt Readings from Last 3  Encounters:  ?09/22/21 180 lb (81.6 kg)  ?06/07/21 170 lb (77.1 kg)  ?05/05/21 170 lb (77.1 kg)  ? ? ?Physical Exam  ?General:  Cooperative, in no acute distress, using cane for ambulation  ?Neuro:  Alert and oriented,  extra-ocular muscles intact  ?HEENT:  Normocephalic, atraumatic, neck supple  ?Skin:  no gross rash, warm, pink. ?Cardiac:  RRR, S1 S2, no MRG ?Respiratory: CTA B/L  ?Vascular:  Ext warm, no cyanosis apprec.; no edema ?Psych:  No HI/SI, judgement and insight good, Euthymic mood. Full Affect. ? ? ?No results found for any visits on 09/22/21. ? Assessment & Plan  ?  ? ? ?Problem List Items Addressed This Visit   ? ?  ? Cardiovascular and Mediastinum  ? Essential hypertension (Chronic)  ?  -Controlled. Continue current medication regimen. Will collect CMP to monitor renal function and electrolytes.  ? ?  ?  ? Relevant Orders  ? CBC w/Diff  ? Comp Met (CMET)  ?  ? Endocrine  ? Glucose intolerance (impaired glucose tolerance) (Chronic)  ? Relevant Orders  ? HgB A1c  ?  ? Other  ? Mixed hyperlipidemia - Primary (Chronic)  ?  -Last lipid panel: HDL 39, LDL 127 ?-Given patient's age and co-morbidities reasonable to continue diet and lifestyle management. Will repeat lipid panel today.  ? ?  ?  ? Relevant Orders  ?  Lipid Profile  ? Vitamin D deficiency (Chronic)  ?  -Last Vitamin D 40.6, patient on Vit D 50,000 units twice weekly. Will repeat Vit D and adjust medication therapy if indicated.  ? ?  ?  ? Relevant Orders  ? Vitamin D (25 hydroxy)  ? Elevated TSH (Chronic)  ? Relevant Orders  ? TSH  ? T4, free  ? ? ?Return for [facility] after 05/05/22.  ?   ? ? ? ?Lorrene Reid, PA-C  ?[facility] ?[phone] (phone) ?[phone] (fax) ? ? Medical Group ?

## 2021-09-23 LAB — CBC WITH DIFFERENTIAL/PLATELET
Basophils Absolute: 0.1 10*3/uL (ref 0.0–0.2)
Basos: 1 %
EOS (ABSOLUTE): 0.2 10*3/uL (ref 0.0–0.4)
Eos: 2 %
Hematocrit: 38 % (ref 37.5–51.0)
Hemoglobin: 13.2 g/dL (ref 13.0–17.7)
Immature Grans (Abs): 0 10*3/uL (ref 0.0–0.1)
Immature Granulocytes: 1 %
Lymphocytes Absolute: 1.4 10*3/uL (ref 0.7–3.1)
Lymphs: 22 %
MCH: 32.4 pg (ref 26.6–33.0)
MCHC: 34.7 g/dL (ref 31.5–35.7)
MCV: 93 fL (ref 79–97)
Monocytes Absolute: 0.9 10*3/uL (ref 0.1–0.9)
Monocytes: 14 %
Neutrophils Absolute: 3.8 10*3/uL (ref 1.4–7.0)
Neutrophils: 60 %
Platelets: 194 10*3/uL (ref 150–450)
RBC: 4.08 x10E6/uL — ABNORMAL LOW (ref 4.14–5.80)
RDW: 13.3 % (ref 11.6–15.4)
WBC: 6.3 10*3/uL (ref 3.4–10.8)

## 2021-09-23 LAB — COMPREHENSIVE METABOLIC PANEL
ALT: 16 IU/L (ref 0–44)
AST: 16 IU/L (ref 0–40)
Albumin/Globulin Ratio: 2 (ref 1.2–2.2)
Albumin: 3.9 g/dL (ref 3.5–4.6)
Alkaline Phosphatase: 74 IU/L (ref 44–121)
BUN/Creatinine Ratio: 25 — ABNORMAL HIGH (ref 10–24)
BUN: 20 mg/dL (ref 10–36)
Bilirubin Total: 0.5 mg/dL (ref 0.0–1.2)
CO2: 22 mmol/L (ref 20–29)
Calcium: 9.4 mg/dL (ref 8.6–10.2)
Chloride: 101 mmol/L (ref 96–106)
Creatinine, Ser: 0.81 mg/dL (ref 0.76–1.27)
Globulin, Total: 2 g/dL (ref 1.5–4.5)
Glucose: 91 mg/dL (ref 70–99)
Potassium: 3.7 mmol/L (ref 3.5–5.2)
Sodium: 137 mmol/L (ref 134–144)
Total Protein: 5.9 g/dL — ABNORMAL LOW (ref 6.0–8.5)
eGFR: 83 mL/min/{1.73_m2} (ref >59)

## 2021-09-23 LAB — LIPID PANEL
Chol/HDL Ratio: 3.9 ratio (ref 0.0–5.0)
Cholesterol, Total: 190 mg/dL (ref 100–199)
HDL: 49 mg/dL (ref >39)
LDL Chol Calc (NIH): 123 mg/dL — ABNORMAL HIGH (ref 0–99)
Triglycerides: 101 mg/dL (ref 0–149)
VLDL Cholesterol Cal: 18 mg/dL (ref 5–40)

## 2021-09-23 LAB — VITAMIN D 25 HYDROXY (VIT D DEFICIENCY, FRACTURES): Vit D, 25-Hydroxy: 36.4 ng/mL (ref 30.0–100.0)

## 2021-09-23 LAB — TSH: TSH: 5.24 u[IU]/mL — ABNORMAL HIGH (ref 0.450–4.500)

## 2021-09-23 LAB — HEMOGLOBIN A1C
Est. average glucose Bld gHb Est-mCnc: 117 mg/dL
Hgb A1c MFr Bld: 5.7 % — ABNORMAL HIGH (ref 4.8–5.6)

## 2021-09-23 LAB — T4, FREE: Free T4: 1.15 ng/dL (ref 0.82–1.77)

## 2021-11-03 ENCOUNTER — Ambulatory Visit: Payer: Medicare HMO | Admitting: Physician Assistant

## 2021-12-10 ENCOUNTER — Other Ambulatory Visit: Payer: Self-pay | Admitting: Physician Assistant

## 2021-12-10 DIAGNOSIS — I10 Essential (primary) hypertension: Secondary | ICD-10-CM

## 2021-12-17 ENCOUNTER — Other Ambulatory Visit: Payer: Self-pay

## 2021-12-17 ENCOUNTER — Other Ambulatory Visit: Payer: Self-pay | Admitting: *Deleted

## 2021-12-17 NOTE — Patient Outreach (Addendum)
  Care Coordination   12/17/2021 Name: Trevor Galloway MRN: [id number] DOB: 25-Apr-1927   Care Coordination Outreach Attempts:  Contact was made with the patient today to offer care coordination services as a benefit of their health plan. Patient/wife explained that they would like a [facility] brochure to be sent to there residence. Once they review they will contact [facility] if they would like to participate. Follow Up Plan:  Additional outreach attempts will be made to offer the patient care coordination information and services.   Encounter Outcome:  Pt. Request to Call Back  Care Coordination Interventions Activated:  No   Care Coordination Interventions:  No, not indicated    Emelia Loron RN, BSN [place] [phone] [email or web address]

## 2021-12-20 ENCOUNTER — Other Ambulatory Visit: Payer: Self-pay | Admitting: Physician Assistant

## 2021-12-20 DIAGNOSIS — R000 Tachycardia, unspecified: Secondary | ICD-10-CM

## 2021-12-20 DIAGNOSIS — I10 Essential (primary) hypertension: Secondary | ICD-10-CM

## 2022-03-08 ENCOUNTER — Other Ambulatory Visit: Payer: Self-pay | Admitting: Physician Assistant

## 2022-03-08 DIAGNOSIS — I10 Essential (primary) hypertension: Secondary | ICD-10-CM

## 2022-05-02 ENCOUNTER — Telehealth: Payer: Self-pay | Admitting: *Deleted

## 2022-05-02 NOTE — Telephone Encounter (Signed)
Pts daughter called in stating that patient needs to renew his handicap placard, she mentioned getting a letter that they still need and I informed her that normally the doctor has to sign the form.  She is going to call and fax a copy to see if this is something provider can complete. Rehana Uncapher Zimmerman Rumple, CMA

## 2022-05-09 NOTE — Telephone Encounter (Signed)
Did this patient bring [place] the handicap forms?

## 2022-05-10 NOTE — Telephone Encounter (Signed)
Daughter did bring this and it was signed when she was here. Donelda Mailhot Zimmerman Rumple, CMA

## 2022-05-17 ENCOUNTER — Encounter: Payer: Self-pay | Admitting: Nurse Practitioner

## 2022-05-17 ENCOUNTER — Ambulatory Visit (INDEPENDENT_AMBULATORY_CARE_PROVIDER_SITE_OTHER): Payer: Medicare HMO | Admitting: Nurse Practitioner

## 2022-05-17 VITALS — Ht 71.0 in | Wt 180.0 lb

## 2022-05-17 DIAGNOSIS — Z0000 Encounter for general adult medical examination without abnormal findings: Secondary | ICD-10-CM | POA: Diagnosis not present

## 2022-05-17 NOTE — Progress Notes (Signed)
Subjective:   Trevor Galloway is a 89 y.o. male who presents for Medicare Annual/Subsequent preventive examination. -phone visit  -most recent labs done 09/2021 --mild elevation of LDL with remainder of lipid panel normal.  -should have routine, fasting labs done prior to next visit.  --HgbA1c 5.7 --Slight elevation of TSH with normal Free T4 --recheck routine, fasting labs prior to next routine visit.   -He denies chest pain, chest pressure, or shortness of breath. He denies headaches or visual disturbances. He denies abdominal pain, nausea, vomiting, or changes in bowel or bladder habits.    Review of Systems    Referred to PCP       Objective:    Today's Vitals   05/17/22 1303  Weight: 180 lb (81.6 kg)  Height: '5\' 11"'$  (1.803 m)   Body mass index is 25.1 kg/m.    Row Labels 01/01/2016    9:48 AM 05/18/2012    3:02 AM  Advanced Directives   Section Header. No data exists in this row.    Does Patient Have a Medical Advance Directive?   Yes Patient has advance directive, copy not in chart  Type of Advance Directive   Living will;Healthcare Power of [place];Living will  Copy of [place] in Chart?    Copy requested from family  Pre-existing out of facility DNR order (yellow form or pink MOST form)    No    Current Medications (verified) Outpatient Encounter Medications as of 05/17/2022  Medication Sig   Ascorbic Acid (VITAMIN C) 1000 MG tablet Take 1,000 mg by mouth daily.   aspirin 81 MG tablet Take 81 mg by mouth daily.   FLUZONE HIGH-DOSE QUADRIVALENT 0.7 ML SUSY    hydrochlorothiazide (HYDRODIURIL) 12.5 MG tablet TAKE 1 TABLET BY MOUTH EVERY DAY   metoprolol succinate (TOPROL-XL) 50 MG 24 hr tablet TAKE 1 TABLET BY MOUTH EVERY DAY WITH OR IMMEDIATELY FOLLOWING A MEAL   Multiple Vitamins-Minerals (PRESERVISION AREDS 2) CAPS Take 1 capsule by mouth 2 (two) times daily.   Vitamin D, Ergocalciferol, (DRISDOL) 1.25 MG (50000  UNIT) CAPS capsule TAKE 1 CAPSULE BY MOUTH EVERY WEDNESDAY AND SUNDAY   meloxicam (MOBIC) 15 MG tablet Take 15 mg by mouth daily. (Patient not taking: Reported on 05/17/2022)   Omega-3 Fatty Acids (FISH OIL) 1000 MG CAPS Take 3 capsules (3,000 mg total) by mouth daily. (Patient not taking: Reported on 05/17/2022)   No facility-administered encounter medications on file as of 05/17/2022.    Allergies (verified) Patient has no known allergies.   History: Past Medical History:  Diagnosis Date   Atrial flutter ([place])    s/p CTI ablation 8/11   Cancer [facility] [place])    Prostate cancer   Family hx colonic polyps    H/O prostate cancer 10/04/2015   Hiatal hernia    Hyperlipidemia    Hypertension    RBBB (right bundle branch block)    Past Surgical History:  Procedure Laterality Date   EYE SURGERY     bilateral cataracts   HERNIA REPAIR     PROSTATE SURGERY     seed implant   Family History  Problem Relation Age of Onset   COPD Father    Parkinsonism Sister    Parkinsonism Brother    Cancer Brother        prostate cancer   Social History   Socioeconomic History   Marital status: Single    Spouse name: Not on file  Number of children: Not on file   Years of education: Not on file   Highest education level: Not on file  Occupational History   Occupation: retired    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  Tobacco Use   Smoking status: Former    Types: Cigarettes    Quit date: 09/23/1965    Years since quitting: 44.6   Smokeless tobacco: Never  Vaping Use   Vaping Use: Never used  Substance and Sexual Activity   Alcohol use: No    Comment: none now   Drug use: No   Sexual activity: Not Currently  Other Topics Concern   Not on file  Social History Narrative   Not on file   Social Determinants of Health   Financial Resource Strain: Low Risk  (05/17/2022)   Overall Financial Resource Strain (CARDIA)    Difficulty of Paying Living Expenses: Not hard at all  Food  Insecurity: No [place] (05/17/2022)   Hunger Vital Sign    Worried About Running Out of Food in the Last Year: Never true    Post in the Last Year: Never true  Transportation Needs: No Transportation Needs (05/17/2022)   PRAPARE - Hydrologist (Medical): No    Lack of Transportation (Non-Medical): No  Physical Activity: Inactive (05/17/2022)   Exercise Vital Sign    Days of Exercise per Week: 0 days    Minutes of Exercise per Session: 0 min  Stress: No Stress Concern Present (05/17/2022)   [place]    Feeling of Stress : Not at all  Social Connections: [place] (05/17/2022)   Social Connection and Isolation Panel [NHANES]    Frequency of Communication with Friends and Family: Three times a week    Frequency of Social Gatherings with Friends and Family: More than three times a week    Attends Religious Services: 1 to 4 times per year    Active Member of [employer] or Organizations: Yes    Attends Archivist Meetings: Never    Marital Status: Married    Tobacco Counseling Counseling given: Not Answered   Clinical Intake:  Pre-visit preparation completed: Yes  Pain : No/denies pain     BMI - recorded: 25.1 Nutritional Status: BMI 25 -29 Overweight Nutritional Risks: None  How often do you need to have someone help you when you read instructions, pamphlets, or other written materials from your doctor or pharmacy?: 2 - Rarely  Diabetic?no  Interpreter Needed?: No      Activities of Daily Living   Row Labels 05/17/2022    1:22 PM 09/22/2021    9:37 AM  In your present state of health, do you have any difficulty performing the following activities:   Section Header. No data exists in this row.    Hearing?   1 0  Vision?   1 1  Difficulty concentrating or making decisions?   1 1  Walking or climbing stairs?   1 1  Dressing or bathing?   0 0  Doing  errands, shopping?   1 1    Patient Care Team: Lorrene Reid, PA-C as PCP - General Rana Snare, MD (Inactive) as Consulting Physician (Urology) Sherlynn Stalls, MD as Consulting Physician (Ophthalmology) Druscilla Brownie, MD as Consulting Physician (Dermatology) Dorna Leitz, MD as Consulting Physician (Orthopedic Surgery) Tat, Eustace Quail, DO as Consulting Physician (Neurology) Thompson Grayer, MD as Consulting Physician (  Cardiology) Ceasar Mons, MD as Consulting Physician (Urology) Darius Bump, [facility] as Pharmacist (Pharmacist)  Indicate any recent Medical Services you may have received from other than [facility] providers in the past year (date may be approximate).     Assessment:   This is a routine wellness examination for [facility].  Hearing/Vision screen No results found.   Depression Screen   Row Labels 05/17/2022    1:20 PM 09/22/2021    9:37 AM 05/05/2021   10:07 AM 11/26/2020    3:04 PM 05/04/2020   11:40 AM 03/05/2020   10:48 AM 11/04/2019   12:58 PM  PHQ 2/9 Scores   Section Header. No data exists in this row.         PHQ - 2 Score   1 0 0  4 0 2  PHQ- 9 Score   3 1 0  8 0 5  Exception Documentation      Patient refusal       Fall Risk   Row Labels 05/17/2022    1:23 PM 09/22/2021    9:36 AM 05/05/2021   10:03 AM 11/26/2020    3:04 PM 05/04/2020   11:39 AM  Fall Risk    Section Header. No data exists in this row.       Falls in the past year?   1 0 0 0 0  Number falls in past yr:   1 0 0 0   Injury with Fall?   1 1 0 0   Risk for fall due to :    Impaired balance/gait;Impaired mobility Impaired balance/gait Impaired balance/gait;Impaired mobility No Fall Risks  Follow up   Falls evaluation completed Falls evaluation completed Falls evaluation completed Falls evaluation completed Falls evaluation completed    [place]:  Any stairs in or around the home? Yes  If so, are there any without handrails? Yes  Home free  of loose throw rugs in walkways, pet beds, electrical cords, etc? Yes  Adequate lighting in your home to reduce risk of falls? Yes   ASSISTIVE DEVICES UTILIZED TO PREVENT FALLS:  Life alert? No  Use of a cane, walker or w/c? Yes  Grab bars in the bathroom? Yes  Shower chair or bench in shower? Yes  Elevated toilet seat or a handicapped toilet? Yes   TIMED UP AND GO:  Was the test performed? No .  Length of time to ambulate 10 feet: 0 sec.  Unable to assess due to phone visit.    Cognitive Function:   Row Labels 04/25/2019    3:22 PM 04/25/2019    3:00 PM  MMSE - Mini Mental State Exam   Section Header. No data exists in this row.    Orientation to time   3 3  Orientation to Place   5 5  Attention/ Calculation   4   Recall   0        Row Labels 05/17/2022    1:10 PM 05/05/2021   10:08 AM 05/04/2020   11:43 AM 04/25/2019    2:53 PM  6CIT Screen   Section Header. No data exists in this row.      What Year?   4 points 4 points 4 points 4 points  What month?   3 points 0 points 0 points 0 points  What time?   0 points 3 points 0 points 3 points  Count back from 20   2 points 0 points 0  points 2 points  Months in reverse   4 points 4 points 4 points 4 points  Repeat phrase   2 points 6 points 10 points 10 points  Total Score   15 points 17 points 18 points 23 points    Immunizations Immunization History  Administered Date(s) Administered   Fluad Quad(high Dose 65+) 03/06/2019, 03/05/2020, 03/10/2021   Influenza Whole 02/13/2009   Influenza, High Dose Seasonal PF 03/28/2014, 02/12/2018   Influenza,inj,Quad PF,6+ Mos 02/18/2013, 02/07/2015   Influenza-Unspecified 02/10/2016, 02/12/2018, 03/10/2021, 02/25/2022   PFIZER(Purple Top)SARS-COV-2 Vaccination 07/14/2019, 08/09/2019   Pneumococcal Conjugate-13 03/25/2014, 02/22/2017   Pneumococcal Polysaccharide-23 03/30/2015   Tdap 01/31/2012    TDAP status: Due, Education has been provided regarding the importance of this  vaccine. Advised may receive this vaccine at local pharmacy or Health Dept. Aware to provide a copy of the vaccination record if obtained from local pharmacy or Health Dept. Verbalized acceptance and understanding.  Flu Vaccine status: Up to date  Pneumococcal vaccine status: Up to date  Covid-19 vaccine status: Completed vaccines  Qualifies for Shingles Vaccine? No   Zostavax completed No   Shingrix Completed?: No.    Education has been provided regarding the importance of this vaccine. Patient has been advised to call insurance company to determine out of pocket expense if they have not yet received this vaccine. Advised may also receive vaccine at local pharmacy or Health Dept. Verbalized acceptance and understanding.  Screening Tests Health Maintenance  Topic Date Due   DTaP/Tdap/Td (2 - Td or Tdap) 01/30/2022   Medicare Annual Wellness (AWV)  05/05/2022   COVID-19 Vaccine (3 - Pfizer risk series) 06/02/2022 (Originally 09/06/2019)   Zoster Vaccines- Shingrix (1 of 2) 08/16/2022 (Originally 09/24/1945)   Pneumonia Vaccine 82+ Years old  Completed   INFLUENZA VACCINE  Completed   HPV VACCINES  Aged Out    Health Maintenance  Health Maintenance Due  Topic Date Due   DTaP/Tdap/Td (2 - Td or Tdap) 01/30/2022   Medicare Annual Wellness (AWV)  05/05/2022    Colorectal cancer screening: Type of screening: Colonoscopy. Completed n/a. Repeat every n/a years  Lung Cancer Screening: (Low Dose CT Chest recommended if Age 51-80 years, 30 pack-year currently smoking OR have quit w/in 15years.) does not qualify.   Lung Cancer Screening Referral: n/a  Additional Screening:  Hepatitis C Screening: does not qualify; Completed n/a  Vision Screening: Recommended annual ophthalmology exams for early detection of glaucoma and other disorders of the eye. Is the patient up to date with their annual eye exam?  Yes  Who is the provider or what is the name of the office in which the patient  attends annual eye exams? Dr. Milagros Loll If pt is not established with a provider, would they like to be referred to a provider to establish care? No .   Dental Screening: Recommended annual dental exams for proper oral hygiene  Community Resource Referral / Chronic Care Management: CRR required this visit?  No   CCM required this visit?  No      Plan:     I have personally reviewed and noted the following in the patient's chart:   Medical and social history Use of alcohol, tobacco or illicit drugs  Current medications and supplements including opioid prescriptions. Patient is not currently taking opioid prescriptions. Functional ability and status Nutritional status Physical activity Advanced directives List of other physicians Hospitalizations, surgeries, and ER visits in previous 12 months Vitals Screenings to include cognitive, depression, and falls  Referrals and appointments  In addition, I have reviewed and discussed with patient certain preventive protocols, quality metrics, and best practice recommendations. A written personalized care plan for preventive services as well as general preventive health recommendations were provided to patient. I connected with [name] on 05/17/22 by audio only telemedicine application and verified that I am speaking with the correct person using two identifiers.   I discussed the limitations, risks, security and privacy concerns of performing an evaluation and management service by telephone and the availability of in person appointments. I also discussed with the patient responsible charge related to this service. The patient expressed understanding and verbally consented to this telephonic visit.   Locations of Patient: Home Location of Provider: [facility]    List any persons and their role that are participating in this visit with patient:     Ronnell Freshwater, NP   05/17/2022   Nurse Notes: 25 min  non face to face

## 2022-08-08 ENCOUNTER — Telehealth: Payer: Self-pay

## 2022-08-08 DIAGNOSIS — R000 Tachycardia, unspecified: Secondary | ICD-10-CM

## 2022-08-08 DIAGNOSIS — I10 Essential (primary) hypertension: Secondary | ICD-10-CM

## 2022-08-08 NOTE — Telephone Encounter (Signed)
Pt son calling requesting medication refills for:  hydrochlorothiazide (HYDRODIURIL) 12.5 MG tablet   metoprolol succinate (TOPROL-XL) 50 MG 24 hr tablet   Pharmacy: [facility]   LOV 05/17/22 ROV 09/26/22

## 2022-08-09 MED ORDER — METOPROLOL SUCCINATE ER 50 MG PO TB24: ORAL_TABLET | ORAL | 1 refills | Status: DC

## 2022-08-09 NOTE — Telephone Encounter (Signed)
Refill sent.

## 2022-08-18 ENCOUNTER — Other Ambulatory Visit: Payer: Self-pay | Admitting: Family Medicine

## 2022-08-18 DIAGNOSIS — Z7689 Persons encountering health services in other specified circumstances: Secondary | ICD-10-CM

## 2022-08-22 ENCOUNTER — Other Ambulatory Visit: Payer: Self-pay

## 2022-08-22 DIAGNOSIS — Z515 Encounter for palliative care: Secondary | ICD-10-CM

## 2022-08-23 ENCOUNTER — Telehealth: Payer: Self-pay

## 2022-08-23 NOTE — Telephone Encounter (Signed)
(  4:25 pm) PC SW left patient's daughter a message confirming that patient is scheduled with the palliative care nurse for 09/01/22 @ 1pm.

## 2022-08-30 ENCOUNTER — Other Ambulatory Visit: Payer: Self-pay

## 2022-09-01 ENCOUNTER — Other Ambulatory Visit: Payer: Self-pay

## 2022-09-01 VITALS — BP 126/72 | HR 62 | Resp 18

## 2022-09-01 DIAGNOSIS — Z515 Encounter for palliative care: Secondary | ICD-10-CM

## 2022-09-14 ENCOUNTER — Other Ambulatory Visit: Payer: Self-pay

## 2022-09-14 DIAGNOSIS — I10 Essential (primary) hypertension: Secondary | ICD-10-CM

## 2022-09-14 DIAGNOSIS — R000 Tachycardia, unspecified: Secondary | ICD-10-CM

## 2022-09-14 MED ORDER — HYDROCHLOROTHIAZIDE 12.5 MG PO TABS: 12.5000 mg | ORAL_TABLET | Freq: Every day | ORAL | 0 refills | Status: DC

## 2022-09-26 ENCOUNTER — Ambulatory Visit (INDEPENDENT_AMBULATORY_CARE_PROVIDER_SITE_OTHER): Payer: Medicare HMO | Admitting: Family Medicine

## 2022-09-26 ENCOUNTER — Encounter: Payer: Self-pay | Admitting: Family Medicine

## 2022-09-26 VITALS — BP 124/76 | HR 63 | Resp 18 | Ht 71.0 in | Wt 154.0 lb

## 2022-09-26 DIAGNOSIS — R000 Tachycardia, unspecified: Secondary | ICD-10-CM

## 2022-09-26 DIAGNOSIS — E782 Mixed hyperlipidemia: Secondary | ICD-10-CM

## 2022-09-26 DIAGNOSIS — Z0000 Encounter for general adult medical examination without abnormal findings: Secondary | ICD-10-CM | POA: Diagnosis not present

## 2022-09-26 DIAGNOSIS — H547 Unspecified visual loss: Secondary | ICD-10-CM

## 2022-09-26 DIAGNOSIS — R7302 Impaired glucose tolerance (oral): Secondary | ICD-10-CM | POA: Diagnosis not present

## 2022-09-26 DIAGNOSIS — I10 Essential (primary) hypertension: Secondary | ICD-10-CM

## 2022-09-26 DIAGNOSIS — R634 Abnormal weight loss: Secondary | ICD-10-CM | POA: Diagnosis not present

## 2022-09-26 DIAGNOSIS — R413 Other amnesia: Secondary | ICD-10-CM | POA: Diagnosis not present

## 2022-09-26 MED ORDER — METOPROLOL SUCCINATE ER 50 MG PO TB24: ORAL_TABLET | ORAL | 3 refills | Status: DC

## 2022-09-26 MED ORDER — HYDROCHLOROTHIAZIDE 12.5 MG PO TABS: 12.5000 mg | ORAL_TABLET | Freq: Every day | ORAL | 3 refills | Status: DC

## 2022-09-26 NOTE — Patient Instructions (Addendum)
We we will start with some lab work to see if there is anything that may be contributing to the recent decrease in weight.  Otherwise, we will try some strategies to make sure that you are getting enough protein and calories and during the day.  I included a printout with some information and tips for a high-protein and high-calorie diet.  Additionally, protein shakes can be a fantastic way to supplement what you are already eating.  Ensure is a great brand, I also personally enjoy the [employer] shakes, they have one in particular that has 42 g of protein.  Send me a message on MyChart to let me know if you are ever interested in a referral to neurology for a formal evaluation for memory loss.

## 2022-09-26 NOTE — Assessment & Plan Note (Signed)
Continue follow-up with retinal specialists.

## 2022-09-26 NOTE — Progress Notes (Signed)
COMMUNITY PALLIATIVE CARE SW NOTE  PATIENT NAME: Trevor Galloway DOB: 11-29-1926 MRN: [id number]  PRIMARY CARE PROVIDER: Melida Quitter, PA  RESPONSIBLE PARTY:  Acct ID - Guarantor Home Phone Work Phone Relationship Acct Type  [id number] MARQ, MILIC* [phone]  Self P/F     [address], [place], [place] [address]   Initial Palliative Care Encounter/Clinical Social Work  [employer] SW completed an initial telephonic encounter with patient's son-Tommy. SW provided education and an introduction to palliative care services, visit frequency, support available and contact information. He provided a verbal consent to services. Tommy expressed that more help is needed in the home. The goal was to keep patient and his wife together at home for as long as possible. He family was not sure if placement would be an option.  He was open to a visit with the palliative care nurse. The next visit was scheduled for 08/30/22 @ 11:30 am.     Social History   Tobacco Use   Smoking status: Former    Types: Cigarettes    Quit date: 09/23/1965    Years since quitting: 57.0    Passive exposure: Past   Smokeless tobacco: Never  Substance Use Topics   Alcohol use: No    Comment: none now    CODE STATUS: To be assessed ADVANCED DIRECTIVES: To be assessed MOST FORM COMPLETE:  To be assessed HOSPICE EDUCATION PROVIDED: No  Duration of encounter and documentation: 30 minutes  [employer], LCSW

## 2022-09-26 NOTE — Assessment & Plan Note (Signed)
We discussed the possibility of a referral to neurology for evaluation for Alzheimer's versus other types of dementia.  Patient's son would like to discuss it with the family prior to moving forward.

## 2022-09-26 NOTE — Progress Notes (Signed)
Complete physical exam  Patient: Trevor Galloway Galloway   DOB: 02-12-1927   89 y.o. Male  MRN: [id number]  Subjective:    Chief Complaint  Patient presents with   Annual Exam    Trevor Galloway Galloway is a 89 y.o. male with his son who presents today for a complete physical exam.  His son provided most of the history, there are concerns for memory loss with Trevor Galloway Galloway.  He has also lost about 25 pounds from the last time that he had an office visit.  He reports that appetite is good, but his wife prepares meals for him.  Her appetite has decreased, his son states that used to each have a frozen meal, but she has started splitting frozen meals between the two of them.  Most recent fall risk assessment:    09/26/2022   10:58 AM  Fall Risk   Falls in the past year? 0  Number falls in past yr: 0  Injury with Fall? 0  Risk for fall due to : Impaired balance/gait;Impaired mobility  Follow up Falls evaluation completed     Most recent depression and anxiety screenings:    09/26/2022   10:37 AM 05/17/2022    1:20 PM  PHQ 2/9 Scores  PHQ - 2 Score  1  PHQ- 9 Score  3  Exception Documentation Patient refusal       05/17/2022    1:21 PM 09/22/2021    9:37 AM  GAD 7 : Generalized Anxiety Score  Nervous, Anxious, on Edge 0 0  Control/stop worrying 0 0  Worry too much - different things 0 0  Trouble relaxing 0 0  Restless 0 0  Easily annoyed or irritable 0 0  Afraid - awful might happen 0 0  Total GAD 7 Score 0 0  Anxiety Difficulty  Not difficult at all    Patient Active Problem List   Diagnosis Date Noted   Weight loss 09/26/2022   Decreased transfer ability 04/28/2019   Impaired ambulation 04/28/2019   History of non anemic vitamin B12 deficiency 11/23/2017   Cancer (HCC) 07/10/2017   History of cardiac radiofrequency ablation 09/26/2016   Impaired memory 09/26/2016   Vitamin D deficiency 04/19/2016   Vision problems 01/01/2016   Chronic venous stasis dermatitis of both lower extremities  01/01/2016   Glucose intolerance (impaired glucose tolerance) 01/01/2016   LVH (left ventricular hypertrophy) 01/01/2016   Diastolic dysfunction- echo shows L grade I diastolic ventricular dysfunction 01/01/2016   H/O prostate cancer 10/04/2015   h/o Shuffling gait due to OA knees/body 09/16/2013   At high risk for falls 06/17/2013   Chronic fatigue and malaise 04/09/2013   Essential hypertension 11/28/2012   h/o Depression- not current 11/28/2012   chronic B/L Peripheral Edema 11/13/2012   Allergic rhinitis 08/15/2012   Mixed hyperlipidemia 07/05/2010   RIGHT BUNDLE BRANCH BLOCK 12/25/2009   TACHYCARDIA 12/25/2009   Atrial flutter (HCC) 12/24/2009    Past Surgical History:  Procedure Laterality Date   EYE SURGERY     bilateral cataracts   HERNIA REPAIR     PROSTATE SURGERY     seed implant   Social History   Tobacco Use   Smoking status: Former    Types: Cigarettes    Quit date: 09/23/1965    Years since quitting: 57.0    Passive exposure: Past   Smokeless tobacco: Never  Vaping Use   Vaping Use: Never used  Substance Use Topics   Alcohol use: No  Comment: none now   Drug use: No   Family History  Problem Relation Age of Onset   COPD Father    Parkinsonism Sister    Parkinsonism Brother    Cancer Brother        prostate cancer   No Known Allergies   Patient Care Team: Melida Quitter, [place] as PCP - General (Family Medicine) Barron Alvine, MD (Inactive) as Consulting Physician (Urology) Stephannie Li, MD as Consulting Physician (Ophthalmology) Cherlyn Roberts, MD as Consulting Physician (Dermatology) Jodi Geralds, MD as Consulting Physician (Orthopedic Surgery) Tat, Octaviano Batty, DO as Consulting Physician (Neurology) Hillis Range, MD (Inactive) as Consulting Physician (Cardiology) Rene Paci, MD as Consulting Physician (Urology) Gabriel Carina, [facility] as Pharmacist (Pharmacist)   Outpatient Medications Prior to Visit  Medication Sig    [DISCONTINUED] hydrochlorothiazide (HYDRODIURIL) 12.5 MG tablet Take 1 tablet (12.5 mg total) by mouth daily.   [DISCONTINUED] metoprolol succinate (TOPROL-XL) 50 MG 24 hr tablet Take with or immediately following a meal.   [DISCONTINUED] Ascorbic Acid (VITAMIN C) 1000 MG tablet Take 1,000 mg by mouth daily. (Patient not taking: Reported on 09/26/2022)   [DISCONTINUED] aspirin 81 MG tablet Take 81 mg by mouth daily. (Patient not taking: Reported on 09/26/2022)   [DISCONTINUED] FLUZONE HIGH-DOSE QUADRIVALENT 0.7 ML SUSY  (Patient not taking: Reported on 09/26/2022)   [DISCONTINUED] meloxicam (MOBIC) 15 MG tablet Take 15 mg by mouth daily. (Patient not taking: Reported on 05/17/2022)   [DISCONTINUED] Multiple Vitamins-Minerals (PRESERVISION AREDS 2) CAPS Take 1 capsule by mouth 2 (two) times daily. (Patient not taking: Reported on 09/26/2022)   [DISCONTINUED] Omega-3 Fatty Acids (FISH OIL) 1000 MG CAPS Take 3 capsules (3,000 mg total) by mouth daily. (Patient not taking: Reported on 05/17/2022)   [DISCONTINUED] Vitamin D, Ergocalciferol, (DRISDOL) 1.25 MG (50000 UNIT) CAPS capsule TAKE 1 CAPSULE BY MOUTH EVERY WEDNESDAY AND SUNDAY (Patient not taking: Reported on 09/26/2022)   No facility-administered medications prior to visit.    Review of Systems  Constitutional:  Positive for weight loss (~25 pounds since office visit 05/17/22). Negative for chills, fever and malaise/fatigue.  HENT:  Negative for congestion, ear pain and hearing loss.   Eyes:  Negative for blurred vision and double vision.       Some vision loss, history of macular degeneration  Respiratory:  Negative for cough and shortness of breath.   Cardiovascular:  Negative for chest pain, palpitations and leg swelling.  Gastrointestinal:  Negative for abdominal pain, constipation, diarrhea and heartburn.  Genitourinary:  Negative for frequency and urgency.  Musculoskeletal:  Negative for myalgias and neck pain.  Neurological:  Negative for  headaches.  Endo/Heme/Allergies:  Negative for polydipsia.  Psychiatric/Behavioral:  Negative for depression. The patient is not nervous/anxious and does not have insomnia.      Objective:    BP 124/76 (BP Location: Left Arm, Patient Position: Sitting, Cuff Size: Normal)   Pulse 63   Resp 18   Ht 5\' 11"  (1.803 m)   Wt 154 lb (69.9 kg)   SpO2 95%   BMI 21.48 kg/m    Physical Exam Constitutional:      General: He is not in acute distress.    Appearance: Normal appearance. He is not ill-appearing.  HENT:     Head: Normocephalic and atraumatic.     Right Ear: Tympanic membrane, ear canal and external ear normal.     Left Ear: Tympanic membrane, ear canal and external ear normal.     Nose: Nose normal.  No congestion or rhinorrhea.     Mouth/Throat:     Mouth: Mucous membranes are moist.     Pharynx: Oropharynx is clear. No oropharyngeal exudate or posterior oropharyngeal erythema.     Comments: Several missing teeth Eyes:     Extraocular Movements: Extraocular movements intact.     Conjunctiva/sclera: Conjunctivae normal.     Pupils: Pupils are equal, round, and reactive to light.  Cardiovascular:     Rate and Rhythm: Normal rate and regular rhythm.     Pulses: Normal pulses.     Heart sounds: Normal heart sounds. No murmur heard.    No friction rub. No gallop.  Pulmonary:     Effort: Pulmonary effort is normal.     Breath sounds: Normal breath sounds. No wheezing, rhonchi or rales.  Abdominal:     General: Bowel sounds are normal.     Palpations: Abdomen is soft. There is no mass.     Tenderness: There is no abdominal tenderness. There is no guarding.  Musculoskeletal:        General: Normal range of motion.     Cervical back: Normal range of motion and neck supple. No tenderness.     Right lower leg: No edema.     Left lower leg: No edema.  Lymphadenopathy:     Cervical: No cervical adenopathy.  Skin:    General: Skin is warm and dry.  Neurological:     General: No  focal deficit present.     Mental Status: He is alert and oriented to person, place, and time.     Comments: Repeated himself/repeated stories stories during visit  Psychiatric:        Mood and Affect: Mood normal.       Assessment & Plan:    Routine Health Maintenance and Physical Exam  Immunization History  Administered Date(s) Administered   Fluad Quad(high Dose 65+) 03/06/2019, 03/05/2020, 03/10/2021   Influenza Whole 02/13/2009   Influenza, High Dose Seasonal PF 03/28/2014, 02/12/2018   Influenza,inj,Quad PF,6+ Mos 02/18/2013, 02/07/2015   Influenza-Unspecified 02/10/2016, 02/12/2018, 03/10/2021, 02/25/2022   PFIZER(Purple Top)SARS-COV-2 Vaccination 07/14/2019, 08/09/2019   Pneumococcal Conjugate-13 03/25/2014, 02/22/2017   Pneumococcal Polysaccharide-23 03/30/2015   Tdap 01/31/2012    Health Maintenance  Topic Date Due   Zoster Vaccines- Shingrix (1 of 2) Never done   COVID-19 Vaccine (3 - Pfizer risk series) 09/06/2019   DTaP/Tdap/Td (2 - Td or Tdap) 01/30/2022   INFLUENZA VACCINE  12/15/2022   Medicare Annual Wellness (AWV)  05/18/2023   Pneumonia Vaccine 69+ Years old  Completed   HPV VACCINES  Aged Out    Discussed health benefits of physical activity, and encouraged him to engage in regular exercise appropriate for his age and condition.  Wellness examination  Essential hypertension Assessment & Plan: Stable, at goal.  Continue hydrochlorothiazide 12.5 mg daily, metoprolol 50 mg daily.  Collecting CMP for electrolytes and renal function.  Will continue to monitor.  Orders: -     CBC with Differential/Platelet; Future -     Comprehensive metabolic panel; Future -     hydroCHLOROthiazide; Take 1 tablet (12.5 mg total) by mouth daily.  Dispense: 90 tablet; Refill: 3 -     Metoprolol Succinate ER; Take with or immediately following a meal.  Dispense: 90 tablet; Refill: 3  h/o Tachycardia -     Metoprolol Succinate ER; Take with or immediately following a meal.   Dispense: 90 tablet; Refill: 3  Mixed hyperlipidemia Assessment & Plan: Last lipid  panel: LDL 123, HDL 49, triglycerides 101.  Continue heart healthy diet.  Patient eats grapefruit every day and does not want to be on cholesterol medicine.  Rechecking lipid panel today.  Orders: -     Lipid panel; Future  Glucose intolerance (impaired glucose tolerance) Assessment & Plan: Most recent A1c 5.7.  Rechecking A1c today.  Orders: -     Hemoglobin A1c; Future  Vision problems Assessment & Plan: Continue follow-up with retinal specialists.   Impaired memory Assessment & Plan: We discussed the possibility of a referral to neurology for evaluation for Alzheimer's versus other types of dementia.  Patient's son would like to discuss it with the family prior to moving forward.   Weight loss Assessment & Plan: 25 pound weight loss from 05/17/2022 to 09/26/2022.  We discussed changes in appetite that occur with aging and the importance of maintaining high-protein and high-calorie intake.  Starting with lab work to evaluate other potential causes of weight loss, current medications not likely to be contributing.  If dietary changes on their own is not enough, we also have the option of a referral to a nutritionist.     Return after 05/18/2023 for Medicare annual wellness visit.  We may follow-up sooner depending on lab results and any next steps needed in workup for weight loss.     Melida Quitter, PA

## 2022-09-26 NOTE — Assessment & Plan Note (Signed)
Stable, at goal.  Continue hydrochlorothiazide 12.5 mg daily, metoprolol 50 mg daily.  Collecting CMP for electrolytes and renal function.  Will continue to monitor.

## 2022-09-26 NOTE — Assessment & Plan Note (Signed)
Most recent A1c 5.7.  Rechecking A1c today.

## 2022-09-26 NOTE — Assessment & Plan Note (Signed)
Last lipid panel: LDL 123, HDL 49, triglycerides 101.  Continue heart healthy diet.  Patient eats grapefruit every day and does not want to be on cholesterol medicine.  Rechecking lipid panel today.

## 2022-09-26 NOTE — Assessment & Plan Note (Signed)
25 pound weight loss from 05/17/2022 to 09/26/2022.  We discussed changes in appetite that occur with aging and the importance of maintaining high-protein and high-calorie intake.  Starting with lab work to evaluate other potential causes of weight loss, current medications not likely to be contributing.  If dietary changes on their own is not enough, we also have the option of a referral to a nutritionist.

## 2022-09-27 LAB — COMPREHENSIVE METABOLIC PANEL
ALT: 16 IU/L (ref 0–44)
AST: 20 IU/L (ref 0–40)
Albumin/Globulin Ratio: 1.8 (ref 1.2–2.2)
Albumin: 4.2 g/dL (ref 3.6–4.6)
Alkaline Phosphatase: 94 IU/L (ref 44–121)
BUN/Creatinine Ratio: 22 (ref 10–24)
BUN: 20 mg/dL (ref 10–36)
Bilirubin Total: 0.3 mg/dL (ref 0.0–1.2)
CO2: 23 mmol/L (ref 20–29)
Calcium: 10.1 mg/dL (ref 8.6–10.2)
Chloride: 103 mmol/L (ref 96–106)
Creatinine, Ser: 0.9 mg/dL (ref 0.76–1.27)
Globulin, Total: 2.4 g/dL (ref 1.5–4.5)
Glucose: 97 mg/dL (ref 70–99)
Potassium: 4.2 mmol/L (ref 3.5–5.2)
Sodium: 139 mmol/L (ref 134–144)
Total Protein: 6.6 g/dL (ref 6.0–8.5)
eGFR: 79 mL/min/{1.73_m2} (ref >59)

## 2022-09-27 LAB — CBC WITH DIFFERENTIAL/PLATELET
Basophils Absolute: 0.1 10*3/uL (ref 0.0–0.2)
Basos: 1 %
EOS (ABSOLUTE): 0.1 10*3/uL (ref 0.0–0.4)
Eos: 1 %
Hematocrit: 42.1 % (ref 37.5–51.0)
Hemoglobin: 14.8 g/dL (ref 13.0–17.7)
Immature Grans (Abs): 0.1 10*3/uL (ref 0.0–0.1)
Immature Granulocytes: 1 %
Lymphocytes Absolute: 1.7 10*3/uL (ref 0.7–3.1)
Lymphs: 24 %
MCH: 32.2 pg (ref 26.6–33.0)
MCHC: 35.2 g/dL (ref 31.5–35.7)
MCV: 92 fL (ref 79–97)
Monocytes Absolute: 1 10*3/uL — ABNORMAL HIGH (ref 0.1–0.9)
Monocytes: 15 %
Neutrophils Absolute: 4.2 10*3/uL (ref 1.4–7.0)
Neutrophils: 58 %
Platelets: 243 10*3/uL (ref 150–450)
RBC: 4.59 x10E6/uL (ref 4.14–5.80)
RDW: 13.1 % (ref 11.6–15.4)
WBC: 7.2 10*3/uL (ref 3.4–10.8)

## 2022-09-27 LAB — LIPID PANEL
Chol/HDL Ratio: 4.1 ratio (ref 0.0–5.0)
Cholesterol, Total: 190 mg/dL (ref 100–199)
HDL: 46 mg/dL (ref >39)
LDL Chol Calc (NIH): 124 mg/dL — ABNORMAL HIGH (ref 0–99)
Triglycerides: 110 mg/dL (ref 0–149)
VLDL Cholesterol Cal: 20 mg/dL (ref 5–40)

## 2022-09-27 LAB — HEMOGLOBIN A1C
Est. average glucose Bld gHb Est-mCnc: 120 mg/dL
Hgb A1c MFr Bld: 5.8 % — ABNORMAL HIGH (ref 4.8–5.6)

## 2022-10-03 NOTE — Progress Notes (Unsigned)
1303 Palliative Care Initial Encounter Note   PATIENT NAME: Trevor Galloway DOB: 1926-10-22 MRN: [id number]  PRIMARY CARE PROVIDER: Melida Quitter, PA  RESPONSIBLE PARTY:  Acct ID - Guarantor Home Phone Work Phone Relationship Acct Type  [id number] SKYLUR, BEDNER* [phone]  Self P/F     [address], [place], [place] [address]   RN completed home visit. SonOrvilla Fus [phone] (1st) and daughter Okey Regal [phone] also present .   HISTORY OF PRESENT ILLNESS:  89yo male with hx of hyperlipidemia, HTN, frequent falls, osteoarthritis, prostate cancer > 5 yrs ago, diastolic dysfunction- grade I, and edema.  Socially: Served in [employer] Was in [employer]. Retired Naval architect.   Cognitive: Alert but oriented to person and family. Unable to recall dates or names of grandchildren. HOH. Possible macular degeneration.    Appetite: Eats 2 meals per day. Breakfast and lunch. Eats 90% of meals. Snacks around 4-5 pm. He does not fix his own meals. Wife or caregiver does. If wife doesn't fix nighttime meal, pt does not eat.   Mobility: Shuffle gait noted. He uses traditional walker all time. Last fall was 2-3 weeks ago when he was taking a shower late at night. No injuries.   Sleeping Pattern: Denies any issues. sleeps well at night when not going to the bathroom. Will often times nap during the day.  Pain:Denies any issues with  pain.  Gi/GU: Denies issues or concerns. Denies incontinence  Palliative Care/ Hospice: RN explained role and purpose of palliative care including visit frequency. Also discussed benefits of hospice care as well as the differences between the two with patient.   Goals of Care: Remain at home with wife.  CODE STATUS: Full Code ADVANCED DIRECTIVES: N MOST FORM: No PPS: 50%  Next appt scheduled: Monica and Dee to call and schedule visit for 3-4 weeks  PHYSICAL EXAM:   VITALS: Today's Vitals   09/01/22 1340  BP: 126/72  Pulse: 62  Resp: 18   SpO2: 94%    LUNGS: diminished in bases CARDIAC:  Regular, no JVD EXTREMITIES: MAE x 4, no edema    Barbette Merino, RN

## 2022-10-04 ENCOUNTER — Other Ambulatory Visit: Payer: Self-pay

## 2022-11-07 ENCOUNTER — Other Ambulatory Visit: Payer: Self-pay

## 2022-12-27 ENCOUNTER — Other Ambulatory Visit: Payer: Self-pay

## 2022-12-27 ENCOUNTER — Emergency Department (HOSPITAL_BASED_OUTPATIENT_CLINIC_OR_DEPARTMENT_OTHER): Payer: Medicare HMO

## 2022-12-27 ENCOUNTER — Encounter (HOSPITAL_BASED_OUTPATIENT_CLINIC_OR_DEPARTMENT_OTHER): Payer: Self-pay

## 2022-12-27 ENCOUNTER — Emergency Department (HOSPITAL_BASED_OUTPATIENT_CLINIC_OR_DEPARTMENT_OTHER)
Admission: EM | Admit: 2022-12-27 | Discharge: 2022-12-27 | Disposition: A | Discharge status: Home / Self Care | Payer: Medicare HMO

## 2022-12-27 ENCOUNTER — Ambulatory Visit: Payer: Medicare HMO

## 2022-12-27 ENCOUNTER — Emergency Department (HOSPITAL_BASED_OUTPATIENT_CLINIC_OR_DEPARTMENT_OTHER): Payer: Medicare HMO | Admitting: Radiology

## 2022-12-27 DIAGNOSIS — S0181XA Laceration without foreign body of other part of head, initial encounter: Secondary | ICD-10-CM

## 2022-12-27 DIAGNOSIS — Z8546 Personal history of malignant neoplasm of prostate: Secondary | ICD-10-CM | POA: Insufficient documentation

## 2022-12-27 DIAGNOSIS — S0011XA Contusion of right eyelid and periocular area, initial encounter: Secondary | ICD-10-CM | POA: Insufficient documentation

## 2022-12-27 DIAGNOSIS — M79641 Pain in right hand: Secondary | ICD-10-CM | POA: Diagnosis not present

## 2022-12-27 DIAGNOSIS — Z87891 Personal history of nicotine dependence: Secondary | ICD-10-CM | POA: Insufficient documentation

## 2022-12-27 DIAGNOSIS — G9389 Other specified disorders of brain: Secondary | ICD-10-CM | POA: Diagnosis not present

## 2022-12-27 DIAGNOSIS — E878 Other disorders of electrolyte and fluid balance, not elsewhere classified: Secondary | ICD-10-CM | POA: Insufficient documentation

## 2022-12-27 DIAGNOSIS — E871 Hypo-osmolality and hyponatremia: Secondary | ICD-10-CM | POA: Diagnosis not present

## 2022-12-27 DIAGNOSIS — W19XXXA Unspecified fall, initial encounter: Secondary | ICD-10-CM

## 2022-12-27 DIAGNOSIS — M4802 Spinal stenosis, cervical region: Secondary | ICD-10-CM | POA: Diagnosis not present

## 2022-12-27 DIAGNOSIS — J929 Pleural plaque without asbestos: Secondary | ICD-10-CM | POA: Diagnosis not present

## 2022-12-27 DIAGNOSIS — Z23 Encounter for immunization: Secondary | ICD-10-CM | POA: Insufficient documentation

## 2022-12-27 DIAGNOSIS — K449 Diaphragmatic hernia without obstruction or gangrene: Secondary | ICD-10-CM | POA: Diagnosis not present

## 2022-12-27 DIAGNOSIS — I10 Essential (primary) hypertension: Secondary | ICD-10-CM | POA: Diagnosis not present

## 2022-12-27 DIAGNOSIS — W01198A Fall on same level from slipping, tripping and stumbling with subsequent striking against other object, initial encounter: Secondary | ICD-10-CM | POA: Insufficient documentation

## 2022-12-27 DIAGNOSIS — S01411A Laceration without foreign body of right cheek and temporomandibular area, initial encounter: Secondary | ICD-10-CM | POA: Diagnosis not present

## 2022-12-27 DIAGNOSIS — M85841 Other specified disorders of bone density and structure, right hand: Secondary | ICD-10-CM | POA: Diagnosis not present

## 2022-12-27 DIAGNOSIS — S299XXA Unspecified injury of thorax, initial encounter: Secondary | ICD-10-CM | POA: Diagnosis not present

## 2022-12-27 DIAGNOSIS — U071 COVID-19: Secondary | ICD-10-CM | POA: Diagnosis not present

## 2022-12-27 DIAGNOSIS — Y9301 Activity, walking, marching and hiking: Secondary | ICD-10-CM | POA: Diagnosis not present

## 2022-12-27 DIAGNOSIS — Y92003 Bedroom of unspecified non-institutional (private) residence as the place of occurrence of the external cause: Secondary | ICD-10-CM | POA: Diagnosis not present

## 2022-12-27 DIAGNOSIS — Z79899 Other long term (current) drug therapy: Secondary | ICD-10-CM | POA: Diagnosis not present

## 2022-12-27 DIAGNOSIS — S199XXA Unspecified injury of neck, initial encounter: Secondary | ICD-10-CM | POA: Diagnosis not present

## 2022-12-27 DIAGNOSIS — I700 Atherosclerosis of aorta: Secondary | ICD-10-CM | POA: Diagnosis not present

## 2022-12-27 DIAGNOSIS — S0993XA Unspecified injury of face, initial encounter: Secondary | ICD-10-CM | POA: Diagnosis present

## 2022-12-27 LAB — CBC WITH DIFFERENTIAL/PLATELET
Abs Immature Granulocytes: 0.08 10*3/uL — ABNORMAL HIGH (ref 0.00–0.07)
Basophils Absolute: 0 10*3/uL (ref 0.0–0.1)
Basophils Relative: 1 %
Eosinophils Absolute: 0 10*3/uL (ref 0.0–0.5)
Eosinophils Relative: 0 %
HCT: 40.9 % (ref 39.0–52.0)
Hemoglobin: 14.4 g/dL (ref 13.0–17.0)
Immature Granulocytes: 1 %
Lymphocytes Relative: 9 %
Lymphs Abs: 0.8 10*3/uL (ref 0.7–4.0)
MCH: 31.9 pg (ref 26.0–34.0)
MCHC: 35.2 g/dL (ref 30.0–36.0)
MCV: 90.5 fL (ref 80.0–100.0)
Monocytes Absolute: 1.6 10*3/uL — ABNORMAL HIGH (ref 0.1–1.0)
Monocytes Relative: 20 %
Neutro Abs: 5.8 10*3/uL (ref 1.7–7.7)
Neutrophils Relative %: 69 %
Platelets: 207 10*3/uL (ref 150–400)
RBC: 4.52 MIL/uL (ref 4.22–5.81)
RDW: 13.5 % (ref 11.5–15.5)
WBC: 8.3 10*3/uL (ref 4.0–10.5)
nRBC: 0 % (ref 0.0–0.2)

## 2022-12-27 LAB — COMPREHENSIVE METABOLIC PANEL
ALT: 17 U/L (ref 0–44)
AST: 24 U/L (ref 15–41)
Albumin: 4 g/dL (ref 3.5–5.0)
Alkaline Phosphatase: 67 U/L (ref 38–126)
Anion gap: 9 (ref 5–15)
BUN: 23 mg/dL (ref 8–23)
CO2: 26 mmol/L (ref 22–32)
Calcium: 9.4 mg/dL (ref 8.9–10.3)
Chloride: 95 mmol/L — ABNORMAL LOW (ref 98–111)
Creatinine, Ser: 0.71 mg/dL (ref 0.61–1.24)
GFR, Estimated: >60 mL/min (ref >60)
Glucose, Bld: 114 mg/dL — ABNORMAL HIGH (ref 70–99)
Potassium: 3.5 mmol/L (ref 3.5–5.1)
Sodium: 130 mmol/L — ABNORMAL LOW (ref 135–145)
Total Bilirubin: 0.4 mg/dL (ref 0.3–1.2)
Total Protein: 7 g/dL (ref 6.5–8.1)

## 2022-12-27 LAB — RESP PANEL BY RT-PCR (RSV, FLU A&B, COVID)  RVPGX2
Influenza A by PCR: NEGATIVE
Influenza B by PCR: NEGATIVE
Resp Syncytial Virus by PCR: NEGATIVE
SARS Coronavirus 2 by RT PCR: POSITIVE — AB

## 2022-12-27 MED ORDER — SODIUM CHLORIDE 0.9 % IV BOLUS
500.0000 mL | Freq: Once | INTRAVENOUS | Status: AC
  Administered 2022-12-27: 500 mL via INTRAVENOUS

## 2022-12-27 MED ORDER — PAXLOVID (300/100) 20 X 150 MG & 10 X 100MG PO TBPK: 3.0000 | ORAL_TABLET | Freq: Two times a day (BID) | ORAL | 0 refills | Status: AC

## 2022-12-27 MED ORDER — LIDOCAINE-EPINEPHRINE (PF) 2 %-1:200000 IJ SOLN
10.0000 mL | Freq: Once | INTRAMUSCULAR | Status: AC
  Administered 2022-12-27: 10 mL
  Filled 2022-12-27: qty 20

## 2022-12-27 MED ORDER — TETANUS-DIPHTH-ACELL PERTUSSIS 5-2.5-18.5 LF-MCG/0.5 IM SUSY
0.5000 mL | PREFILLED_SYRINGE | Freq: Once | INTRAMUSCULAR | Status: AC
  Administered 2022-12-27: 0.5 mL via INTRAMUSCULAR
  Filled 2022-12-27: qty 0.5

## 2022-12-27 NOTE — ED Notes (Signed)
Discharge paperwork given and verbally understood. 

## 2022-12-27 NOTE — ED Triage Notes (Signed)
Feeling 'sick' and 'fatigued' since Sunday. Accompanied by cough. Larey Seat today and struck right face on wood furniture. No thinners. No LOC. A+Ox4. No neck tenderness, no spine tenderness. Ambulatory with walker after fall.

## 2022-12-27 NOTE — ED Notes (Signed)
Pt aware of the need for a urine... Unable to currently provide the sample... 

## 2022-12-27 NOTE — Discharge Instructions (Signed)
As discussed, workup today overall reassuring.  CT imaging of your head and neck were without any abnormality from the fall as well as the chest.  No evidence of pneumonia on CT imaging of the chest.  You did test positive for COVID which I think is likely causing your sore throat as well as your cough.  As discussed, we will send him paper prescription of Paxlovid to use if desired for treatment of COVID infection.  Recommend continue use of Tylenol for pain/fever.  You may find Cepacol throat lozenges beneficial for sore throat as well as recommend Nasacort/Flonase nasal spray for nasal congestion which could be causing irritation of your throat.  Regarding your facial cut, 5 nonabsorbable sutures were placed and should be removed in the next 5 to 7 days.  You may wash cut area gently with warm soapy water. Please do not hesitate to return to the emergency department if there are worrisome signs and symptoms we discussed to become apparent.

## 2022-12-27 NOTE — ED Provider Notes (Signed)
[place] EMERGENCY DEPARTMENT AT [facility] Provider Note   CSN: [id number] Arrival date & time: 12/27/22  1238     History  Chief Complaint  Patient presents with   Trevor Galloway is a 89 y.o. male.   Fall   89 year old male presents emergency department after a fall.  Patient states that he was leaving the bathroom walking back into his bedroom when he fell.  Patient unsure of how exactly he fell but difficult to obtain detailed history from patient due to baseline dementia.  Son at bedside who provides additional history.  Son states the patient has been "sick" for the past 2 days.  States they went to a family reunion on Saturday and patient subsequently developed generalized fatigue, cough, sore throat on Sunday.  Reports persistence of symptoms through today.  Denies any known sick exposure but there were many individuals at family gathering.  Denies any fever, chills, chest pain, shortness of breath, abdominal pain, urinary symptoms, change in bowel habits.  Patient states that when he fell, he struck the right side of his face on a nearby chest causing a cut to his right cheek.  Denies any current visual disturbance, gait abnormality from baseline, weakness/sensory deficits in upper or lower extremities slurred speech, facial droop.  Patient does report 1 episode of emesis but unable to remember whether or not it happened before or after the fall.  Denies blood thinner use or loss of consciousness during fall.  Past medical history significant for atrial flutter not currently anticoagulated, hypertension, hyperlipidemia, prostate cancer, hyperlipidemia, chronic venous stasis, LVH,  Home Medications Prior to Admission medications   Medication Sig Start Date End Date Taking? Authorizing Provider  nirmatrelvir & ritonavir (PAXLOVID, 300/100,) 20 x 150 MG & 10 x 100MG  TBPK Take 3 tablets by mouth 2 (two) times daily for 5 days. 12/27/22 01/01/23 Yes Sherian Maroon  A, PA  hydrochlorothiazide (HYDRODIURIL) 12.5 MG tablet Take 1 tablet (12.5 mg total) by mouth daily. 09/26/22   Melida Quitter, PA  metoprolol succinate (TOPROL-XL) 50 MG 24 hr tablet Take with or immediately following a meal. 09/26/22   Melida Quitter, PA      Allergies    Patient has no known allergies.    Review of Systems   Review of Systems  All other systems reviewed and are negative.   Physical Exam Updated Vital Signs BP (!) 141/97 (BP Location: Left Arm)   Pulse 83   Temp 99.1 F (37.3 C) (Oral)   Resp (!) 24   Ht 5\' 10"  (1.778 m)   SpO2 94%   BMI 22.10 kg/m  Physical Exam Vitals and nursing note reviewed.  Constitutional:      General: He is not in acute distress.    Appearance: He is well-developed.  HENT:     Head: Normocephalic.     Comments: Patient with swelling and ecchymosis noted inferiorly of right eye with laceration measuring 4 to 5 cm and stellate in appearance. Eyes:     Conjunctiva/sclera: Conjunctivae normal.  Cardiovascular:     Rate and Rhythm: Normal rate and regular rhythm.     Pulses: Normal pulses.  Pulmonary:     Effort: Pulmonary effort is normal. No respiratory distress.     Breath sounds: Normal breath sounds. No wheezing, rhonchi or rales.  Abdominal:     Palpations: Abdomen is soft.     Tenderness: There is no abdominal tenderness.  Musculoskeletal:  General: No swelling.     Cervical back: Neck supple.     Comments: 1+ bilateral lower extremity edema.  No midline tenderness of cervical, thoracic, lumbar spine with no obvious step-off or deformity noted.  Patient without tenderness to palpation of upper or lower extremities with symmetric strength bilateral upper and lower extremities.  Patient with mild skin tear on the medial aspect of right hand palm.  No active bleeding.  No expanding tenderness of right hand.  Mild anterior chest wall tenderness to palpation.  Skin:    General: Skin is warm and dry.     Capillary  Refill: Capillary refill takes less than 2 seconds.  Neurological:     Mental Status: He is alert.     Comments: Alert and oriented to self, place, and most of details regarding event  Speech is fluent, clear without dysarthria or dysphasia.   Strength symmetric in upper/lower extremities   Sensation intact in upper/lower extremities   Patient able to ambulate with walker CN I not tested  CN II not tested CN III, IV, VI PERRLA and EOMs intact bilaterally  CN V Intact sensation to sharp and light touch to the face  CN VII facial movements symmetric  CN VIII not tested  CN IX, X no uvula deviation, symmetric rise of soft palate  CN XI symmetric SCM and trapezius strength bilaterally  CN XII Midline tongue protrusion, symmetric L/R movements     Psychiatric:        Mood and Affect: Mood normal.     ED Results / Procedures / Treatments   Labs (all labs ordered are listed, but only abnormal results are displayed) Labs Reviewed  RESP PANEL BY RT-PCR (RSV, FLU A&B, COVID)  RVPGX2 - Abnormal; Notable for the following components:      Result Value   SARS Coronavirus 2 by RT PCR POSITIVE (*)    All other components within normal limits  COMPREHENSIVE METABOLIC PANEL - Abnormal; Notable for the following components:   Sodium 130 (*)    Chloride 95 (*)    Glucose, Bld 114 (*)    All other components within normal limits  CBC WITH DIFFERENTIAL/PLATELET - Abnormal; Notable for the following components:   Monocytes Absolute 1.6 (*)    Abs Immature Granulocytes 0.08 (*)    All other components within normal limits    EKG EKG Interpretation Date/Time:  Tuesday December 27 2022 13:42:27 EDT Ventricular Rate:  85 PR Interval:  284 QRS Duration:  102 QT Interval:  387 QTC Calculation: 461 R Axis:   -47  Text Interpretation: Sinus rhythm Atrial premature complex Prolonged PR interval Abnormal R-wave progression, late transition Inferior infarct, old Confirmed by Estanislado Pandy  [phone]) on 12/27/2022 3:11:23 PM  Radiology CT Head Wo Contrast  Result Date: 12/27/2022 CLINICAL DATA:  Provided history: Facial trauma, blunt. Neck trauma. EXAM: CT HEAD WITHOUT CONTRAST CT MAXILLOFACIAL WITHOUT CONTRAST CT CERVICAL SPINE WITHOUT CONTRAST TECHNIQUE: Multidetector CT imaging of the head, cervical spine, and maxillofacial structures were performed using the standard protocol without intravenous contrast. Multiplanar CT image reconstructions of the cervical spine and maxillofacial structures were also generated. RADIATION DOSE REDUCTION: This exam was performed according to the departmental dose-optimization program which includes automated exposure control, adjustment of the mA and/or kV according to patient size and/or use of iterative reconstruction technique. COMPARISON:  None. FINDINGS: CT HEAD FINDINGS Brain: Moderate-to-advanced generalized cerebral atrophy. Lateral and third ventriculomegaly may be entirely due to cerebral volume loss. However, there  is an acute callosal angle and some crowding of the sulci at the high frontoparietal vertex, and these findings can be seen in setting of normal pressure hydrocephalus (NPH). A small chronic cortically-based infarct is questioned within the left parietal lobe (series 5, image 35) (series 4, image 21). Chronic lacunar infarct within the left thalamus. Moderate-to-advanced patchy and confluent hypoattenuation within the cerebral white matter, nonspecific but compatible with chronic small vessel ischemic disease. There is no acute intracranial hemorrhage. No demarcated cortical infarct. No extra-axial fluid collection. No evidence of an intracranial mass. No midline shift. Vascular: No hyperdense vessel.  Atherosclerotic calcifications. Skull: No calvarial fracture or aggressive osseous lesion. Other: Trace fluid within the left mastoid air cells. CT MAXILLOFACIAL FINDINGS Osseous: No acute maxillofacial fracture is identified. Poor dentition.  Periapical lucency surrounding multiple maxillary teeth consistent with periorbital disease. Orbits: No acute orbital finding. Sinuses: Trace mucosal thickening within the right maxillary sinus. Mild mucosal thickening, and small mucous retention cyst, within the inferior left maxillary sinus. Trace mucosal thickening within the bilateral sphenoid sinuses. Minimal mucosal thickening scattered within bilateral ethmoid air cells, and within the inferior frontal sinuses. Soft tissues: Right facial hematoma and laceration. CT CERVICAL SPINE FINDINGS Alignment: Dextrocurvature of the cervical spine. Nonspecific straightening of the expected cervical lordosis. 3 mm C3-C4 grade 1 anterolisthesis. 3 mm C6-C7 grade 1 retrolisthesis. 2 mm C7-T1 grade 1 anterolisthesis. Skull base and vertebrae: The basion-dental and atlanto-dental intervals are maintained.No evidence of acute fracture to the cervical spine. Facet ankylosis bilaterally, and possible early osseous fusion across the disc space, at C2-C3. Soft tissues and spinal canal: No prevertebral fluid or swelling. No visible canal hematoma. Disc levels: Cervical spondylosis with multilevel disc space narrowing, disc bulges/central disc protrusions, posterior disc osteophyte complexes, uncovertebral hypertrophy and facet arthrosis. Disc space narrowing is greatest at C3-C4, C4-C5, C5-C6 and C6-C7 (advanced at these levels). No appreciable high-grade spinal canal stenosis. Multilevel bony neural foraminal narrowing. Upper chest: No consolidation within the imaged lung apices. No visible pneumothorax. IMPRESSION: CT head: 1. No acute post-traumatic intracranial findings. 2. Possible small chronic cortically-based infarct within the left parietal lobe. 3. Chronic lacunar infarct within the left thalamus. 4. Moderate-to-advanced cerebral white matter chronic small vessel ischemic disease. 5. Moderate-to-advanced generalized cerebral atrophy. 6. Lateral and third ventriculomegaly  may be entirely due to cerebral volume loss. However, there are some imaging features which can be seen in the setting of normal pressure hydrocephalus (NPH) and clinical correlation is recommended. CT maxillofacial: 1. No evidence of an acute maxillofacial fracture. 2. Right facial hematoma and laceration. 3. Paranasal sinus disease as described. 4. Poor dentition. CT cervical spine: 1. No evidence of an acute cervical spine fracture. 2. Nonspecific straightening of the expected cervical lordosis. 3. Dextrocurvature of the cervical spine. 4. Grade 1 spondylolisthesis at C3-C4, C6-C7 and C7-T1. 5. Cervical spondylosis as described. Electronically Signed   By: Jackey Loge D.O.   On: 12/27/2022 15:40   CT Maxillofacial Wo Contrast  Result Date: 12/27/2022 CLINICAL DATA:  Provided history: Facial trauma, blunt. Neck trauma. EXAM: CT HEAD WITHOUT CONTRAST CT MAXILLOFACIAL WITHOUT CONTRAST CT CERVICAL SPINE WITHOUT CONTRAST TECHNIQUE: Multidetector CT imaging of the head, cervical spine, and maxillofacial structures were performed using the standard protocol without intravenous contrast. Multiplanar CT image reconstructions of the cervical spine and maxillofacial structures were also generated. RADIATION DOSE REDUCTION: This exam was performed according to the departmental dose-optimization program which includes automated exposure control, adjustment of the mA and/or kV according to patient  size and/or use of iterative reconstruction technique. COMPARISON:  None. FINDINGS: CT HEAD FINDINGS Brain: Moderate-to-advanced generalized cerebral atrophy. Lateral and third ventriculomegaly may be entirely due to cerebral volume loss. However, there is an acute callosal angle and some crowding of the sulci at the high frontoparietal vertex, and these findings can be seen in setting of normal pressure hydrocephalus (NPH). A small chronic cortically-based infarct is questioned within the left parietal lobe (series 5, image 35)  (series 4, image 21). Chronic lacunar infarct within the left thalamus. Moderate-to-advanced patchy and confluent hypoattenuation within the cerebral white matter, nonspecific but compatible with chronic small vessel ischemic disease. There is no acute intracranial hemorrhage. No demarcated cortical infarct. No extra-axial fluid collection. No evidence of an intracranial mass. No midline shift. Vascular: No hyperdense vessel.  Atherosclerotic calcifications. Skull: No calvarial fracture or aggressive osseous lesion. Other: Trace fluid within the left mastoid air cells. CT MAXILLOFACIAL FINDINGS Osseous: No acute maxillofacial fracture is identified. Poor dentition. Periapical lucency surrounding multiple maxillary teeth consistent with periorbital disease. Orbits: No acute orbital finding. Sinuses: Trace mucosal thickening within the right maxillary sinus. Mild mucosal thickening, and small mucous retention cyst, within the inferior left maxillary sinus. Trace mucosal thickening within the bilateral sphenoid sinuses. Minimal mucosal thickening scattered within bilateral ethmoid air cells, and within the inferior frontal sinuses. Soft tissues: Right facial hematoma and laceration. CT CERVICAL SPINE FINDINGS Alignment: Dextrocurvature of the cervical spine. Nonspecific straightening of the expected cervical lordosis. 3 mm C3-C4 grade 1 anterolisthesis. 3 mm C6-C7 grade 1 retrolisthesis. 2 mm C7-T1 grade 1 anterolisthesis. Skull base and vertebrae: The basion-dental and atlanto-dental intervals are maintained.No evidence of acute fracture to the cervical spine. Facet ankylosis bilaterally, and possible early osseous fusion across the disc space, at C2-C3. Soft tissues and spinal canal: No prevertebral fluid or swelling. No visible canal hematoma. Disc levels: Cervical spondylosis with multilevel disc space narrowing, disc bulges/central disc protrusions, posterior disc osteophyte complexes, uncovertebral hypertrophy  and facet arthrosis. Disc space narrowing is greatest at C3-C4, C4-C5, C5-C6 and C6-C7 (advanced at these levels). No appreciable high-grade spinal canal stenosis. Multilevel bony neural foraminal narrowing. Upper chest: No consolidation within the imaged lung apices. No visible pneumothorax. IMPRESSION: CT head: 1. No acute post-traumatic intracranial findings. 2. Possible small chronic cortically-based infarct within the left parietal lobe. 3. Chronic lacunar infarct within the left thalamus. 4. Moderate-to-advanced cerebral white matter chronic small vessel ischemic disease. 5. Moderate-to-advanced generalized cerebral atrophy. 6. Lateral and third ventriculomegaly may be entirely due to cerebral volume loss. However, there are some imaging features which can be seen in the setting of normal pressure hydrocephalus (NPH) and clinical correlation is recommended. CT maxillofacial: 1. No evidence of an acute maxillofacial fracture. 2. Right facial hematoma and laceration. 3. Paranasal sinus disease as described. 4. Poor dentition. CT cervical spine: 1. No evidence of an acute cervical spine fracture. 2. Nonspecific straightening of the expected cervical lordosis. 3. Dextrocurvature of the cervical spine. 4. Grade 1 spondylolisthesis at C3-C4, C6-C7 and C7-T1. 5. Cervical spondylosis as described. Electronically Signed   By: Jackey Loge D.O.   On: 12/27/2022 15:40   CT Cervical Spine Wo Contrast  Result Date: 12/27/2022 CLINICAL DATA:  Provided history: Facial trauma, blunt. Neck trauma. EXAM: CT HEAD WITHOUT CONTRAST CT MAXILLOFACIAL WITHOUT CONTRAST CT CERVICAL SPINE WITHOUT CONTRAST TECHNIQUE: Multidetector CT imaging of the head, cervical spine, and maxillofacial structures were performed using the standard protocol without intravenous contrast. Multiplanar CT image reconstructions of the cervical  spine and maxillofacial structures were also generated. RADIATION DOSE REDUCTION: This exam was performed  according to the departmental dose-optimization program which includes automated exposure control, adjustment of the mA and/or kV according to patient size and/or use of iterative reconstruction technique. COMPARISON:  None. FINDINGS: CT HEAD FINDINGS Brain: Moderate-to-advanced generalized cerebral atrophy. Lateral and third ventriculomegaly may be entirely due to cerebral volume loss. However, there is an acute callosal angle and some crowding of the sulci at the high frontoparietal vertex, and these findings can be seen in setting of normal pressure hydrocephalus (NPH). A small chronic cortically-based infarct is questioned within the left parietal lobe (series 5, image 35) (series 4, image 21). Chronic lacunar infarct within the left thalamus. Moderate-to-advanced patchy and confluent hypoattenuation within the cerebral white matter, nonspecific but compatible with chronic small vessel ischemic disease. There is no acute intracranial hemorrhage. No demarcated cortical infarct. No extra-axial fluid collection. No evidence of an intracranial mass. No midline shift. Vascular: No hyperdense vessel.  Atherosclerotic calcifications. Skull: No calvarial fracture or aggressive osseous lesion. Other: Trace fluid within the left mastoid air cells. CT MAXILLOFACIAL FINDINGS Osseous: No acute maxillofacial fracture is identified. Poor dentition. Periapical lucency surrounding multiple maxillary teeth consistent with periorbital disease. Orbits: No acute orbital finding. Sinuses: Trace mucosal thickening within the right maxillary sinus. Mild mucosal thickening, and small mucous retention cyst, within the inferior left maxillary sinus. Trace mucosal thickening within the bilateral sphenoid sinuses. Minimal mucosal thickening scattered within bilateral ethmoid air cells, and within the inferior frontal sinuses. Soft tissues: Right facial hematoma and laceration. CT CERVICAL SPINE FINDINGS Alignment: Dextrocurvature of the  cervical spine. Nonspecific straightening of the expected cervical lordosis. 3 mm C3-C4 grade 1 anterolisthesis. 3 mm C6-C7 grade 1 retrolisthesis. 2 mm C7-T1 grade 1 anterolisthesis. Skull base and vertebrae: The basion-dental and atlanto-dental intervals are maintained.No evidence of acute fracture to the cervical spine. Facet ankylosis bilaterally, and possible early osseous fusion across the disc space, at C2-C3. Soft tissues and spinal canal: No prevertebral fluid or swelling. No visible canal hematoma. Disc levels: Cervical spondylosis with multilevel disc space narrowing, disc bulges/central disc protrusions, posterior disc osteophyte complexes, uncovertebral hypertrophy and facet arthrosis. Disc space narrowing is greatest at C3-C4, C4-C5, C5-C6 and C6-C7 (advanced at these levels). No appreciable high-grade spinal canal stenosis. Multilevel bony neural foraminal narrowing. Upper chest: No consolidation within the imaged lung apices. No visible pneumothorax. IMPRESSION: CT head: 1. No acute post-traumatic intracranial findings. 2. Possible small chronic cortically-based infarct within the left parietal lobe. 3. Chronic lacunar infarct within the left thalamus. 4. Moderate-to-advanced cerebral white matter chronic small vessel ischemic disease. 5. Moderate-to-advanced generalized cerebral atrophy. 6. Lateral and third ventriculomegaly may be entirely due to cerebral volume loss. However, there are some imaging features which can be seen in the setting of normal pressure hydrocephalus (NPH) and clinical correlation is recommended. CT maxillofacial: 1. No evidence of an acute maxillofacial fracture. 2. Right facial hematoma and laceration. 3. Paranasal sinus disease as described. 4. Poor dentition. CT cervical spine: 1. No evidence of an acute cervical spine fracture. 2. Nonspecific straightening of the expected cervical lordosis. 3. Dextrocurvature of the cervical spine. 4. Grade 1 spondylolisthesis at C3-C4,  C6-C7 and C7-T1. 5. Cervical spondylosis as described. Electronically Signed   By: Jackey Loge D.O.   On: 12/27/2022 15:40   CT Chest Wo Contrast  Result Date: 12/27/2022 CLINICAL DATA:  Chest trauma, blunt EXAM: CT CHEST WITHOUT CONTRAST TECHNIQUE: Multidetector CT imaging of the chest  was performed following the standard protocol without IV contrast. RADIATION DOSE REDUCTION: This exam was performed according to the departmental dose-optimization program which includes automated exposure control, adjustment of the mA and/or kV according to patient size and/or use of iterative reconstruction technique. COMPARISON:  None Available. FINDINGS: Cardiovascular: Coronary and aortic calcifications. Mediastinum/Nodes: Small hiatal hernia. Lungs/Pleura: Partially calcified bilateral pleural plaques. Dependent atelectasis in the lung bases. Upper Abdomen: No acute abnormality. Musculoskeletal: Multilevel spondylitic change in the lower thoracic spine. No acute findings. IMPRESSION: 1. No acute findings. 2. Bilateral calcified pleural plaques. 3.  Aortic Atherosclerosis (ICD10-I70.0). Electronically Signed   By: Corlis Leak M.D.   On: 12/27/2022 15:35   DG Hand Complete Right  Result Date: 12/27/2022 CLINICAL DATA:  Pain post fall EXAM: RIGHT HAND - COMPLETE 3+ VIEW COMPARISON:  None Available. FINDINGS: Diffuse osteopenia. No fracture or dislocation. Regional soft tissues unremarkable. IMPRESSION: Osteopenia. No fracture or other acute findings. Electronically Signed   By: Corlis Leak M.D.   On: 12/27/2022 15:33    Procedures .Marland KitchenLaceration Repair  Date/Time: 12/27/2022 4:35 PM  Performed by: Peter Garter, PA Authorized by: Peter Garter, PA   Consent:    Consent obtained:  Verbal   Consent given by:  Patient   Risks, benefits, and alternatives were discussed: yes     Risks discussed:  Need for additional repair, infection, nerve damage, poor wound healing, poor cosmetic result, pain, vascular  damage, tendon damage and retained foreign body   Alternatives discussed:  Observation, delayed treatment, referral and no treatment Universal protocol:    Procedure explained and questions answered to patient or proxy's satisfaction: yes     Patient identity confirmed:  Arm band and verbally with patient Anesthesia:    Anesthesia method:  Local infiltration   Local anesthetic:  Lidocaine 2% WITH epi Laceration details:    Location:  Face   Face location:  R cheek   Length (cm):  5.2 Pre-procedure details:    Preparation:  Patient was prepped and draped in usual sterile fashion and imaging obtained to evaluate for foreign bodies Exploration:    Limited defect created (wound extended): no     Hemostasis achieved with:  Direct pressure   Imaging obtained comment:  CT   Imaging outcome: foreign body not noted     Wound exploration: wound explored through full range of motion and entire depth of wound visualized     Contaminated: no   Treatment:    Area cleansed with:  Saline and chlorhexidine   Amount of cleaning:  Standard   Irrigation solution:  Sterile saline   Irrigation volume:  250cc   Irrigation method:  Syringe   Visualized foreign bodies/material removed: no     Debridement:  None   Undermining:  None   Scar revision: no   Skin repair:    Repair method:  Sutures   Suture size:  5-0   Suture material:  Prolene   Suture technique:  Simple interrupted   Number of sutures:  5 Approximation:    Approximation:  Close Repair type:    Repair type:  Simple Post-procedure details:    Dressing:  Open (no dressing)   Procedure completion:  Tolerated well, no immediate complications     Medications Ordered in ED Medications  lidocaine-EPINEPHrine (XYLOCAINE W/EPI) 2 %-1:200000 (PF) injection 10 mL (10 mLs Infiltration Given by Other 12/27/22 1535)  sodium chloride 0.9 % bolus 500 mL (0 mLs Intravenous Stopped 12/27/22 1735)  Tdap (BOOSTRIX)  injection 0.5 mL (0.5 mLs  Intramuscular Given 12/27/22 1641)    ED Course/ Medical Decision Making/ A&P                                 Medical Decision Making Amount and/or Complexity of Data Reviewed Labs: ordered. Radiology: ordered.  Risk Prescription drug management.   This patient presents to the ED for concern of fall, this involves an extensive number of treatment options, and is a complaint that carries with it a high risk of complications and morbidity.  The differential diagnosis includes CVA, fracture, dislocation, strain/pain, ligamentous/tendinous injury, neurovascular compromise, pneumothorax, solid organ damage   Co morbidities that complicate the patient evaluation  See HPI   Additional history obtained:  Additional history obtained from EMR External records from outside source obtained and reviewed including hospital records   Lab Tests:  I Ordered, and personally interpreted labs.  The pertinent results include: COVID-positive.  No leukocytosis.  No evidence of anemia.  Placed to the normal range.  Mild hyponatremia as well as hypochloremia of 06/14/1993 respectively.  No transaminitis.  No renal dysfunction.   Imaging Studies ordered:  I ordered imaging studies including CT head/cervical spine/maxillofacial.  CT chest.  Right hand x-ray I independently visualized and interpreted imaging which showed  CT head/cervical spine/maxillofacial: No acute traumatic injury.  Possible small chronic cortically based infarct within the left parietal lobe.  Chronic lacunar infarct within the left thalamus.  Moderate to aged advanced cerebral white matter chronic small vessel ischemic disease.  Moderate to advanced generalized cerebral atrophy.  Lateral and third ventriculomegaly.  No evidence of acute maxillofacial fracture.  Right facial hematoma and laceration.  Paranasal sinus disease.  Poor dentition.  No acute cervical fracture.  Dextrocurvature of cervical spine.  Grade 1 spondylolisthesis at  C3-C4, C6-C7, C7-T1.  Cervical spondylosis. CT chest: No acute abnormality.  Bilateral calcified pleural plaques.  Aortic atherosclerosis. Right hand x-ray: No acute osseous abnormality.  Osteopenia. I agree with the radiologist interpretation  Cardiac Monitoring: / EKG:  The patient was maintained on a cardiac monitor.  I personally viewed and interpreted the cardiac monitored which showed an underlying rhythm of: Sinus rhythm.  PAC.  Prolonged PR.  Consultations Obtained:  I requested consultation with attending physician Dr. Anitra Lauth who independently evaluated the patient with agreement with treatment plan going forward  Problem List / ED Course / Critical interventions / Medication management  Fall, facial laceration, COVID I ordered medication including Tdap, 500 cc normal saline, lidocaine with epinephrine  Reevaluation of the patient after these medicines showed that the patient improved I have reviewed the patients home medicines and have made adjustments as needed   Social Determinants of Health:  Former cigarette use.  Denies illicit drug use.   Test / Admission - Considered:  Fall, facial laceration, COVID Vitals signs significant for hypertension with blood pressure 141/97. Otherwise within normal range and stable throughout visit. Laboratory/imaging studies significant for: See above 89 year old male presents emergency department after a fall.  Fall believed to be mechanical in nature as patient did not have any symptoms such as lightheadedness/dizziness, chest pain, shortness of breath or other real complaint prior to fall.  States that he just "ended up on the floor."  Patient without loss of consciousness or anticoagulation use.  Patient's workup from a traumatic perspective overall reassuring.  CT/x-ray imaging concerning for facial laceration along with swelling/hematoma but otherwise, negative for  any acute traumatic injury from fall.  Patient with obvious facial  trauma with suffering a laceration which was repaired manage depicted above.  Patient without any acute neurologic deficit with negative CT imaging of the head; low suspicion for CVA.  Patient's laboratory studies significant for mild hyponatremia as well as hypochloremia supplemented via IV fluids as well as positive testing for COVID.  I suspect that patient's cough as well as generalized weakness as well as sore throat likely secondary to current COVID infection.  Patient without meeting SIRS criteria, evidence of pneumonia on CT imaging of the chest.  Offered patient antiviral in the form of Paxlovid of which patient and family member requested paper prescription to decide whether or not he was going to take that medication.  Further symptomatic therapy regarding current COVID infection recommended as an EVS.  Close follow-up with primary care recommended within 2 to 3 days for reevaluation of symptoms.  Patient able to ambulate with walker at his baseline distance while in the ED as well as passed p.o. trial of solid/liquid without difficulty.  Treatment plan discussed at length with patient and he acknowledged understanding was agreeable to send plan.  Patient overall well-appearing, afebrile in no acute distress. Worrisome signs and symptoms were discussed with the patient, and the patient acknowledged understanding to return to the ED if noticed. Patient was stable upon discharge.          Final Clinical Impression(s) / ED Diagnoses Final diagnoses:  Fall, initial encounter  Facial laceration, initial encounter  COVID    Rx / DC Orders ED Discharge Orders          Ordered    nirmatrelvir & ritonavir (PAXLOVID, 300/100,) 20 x 150 MG & 10 x 100MG  TBPK  2 times daily        12/27/22 1736              Peter Garter, [place] 12/27/22 1818    Gwyneth Sprout, MD 12/29/22 1333

## 2022-12-27 NOTE — ED Notes (Signed)
Pt was able to drink fluids without feeling N/V... Provider informed and aware.Marland KitchenMarland Kitchen

## 2023-01-02 ENCOUNTER — Ambulatory Visit: Payer: Medicare HMO

## 2023-01-02 ENCOUNTER — Ambulatory Visit
Admission: RE | Admit: 2023-01-02 | Discharge: 2023-01-02 | Disposition: A | Discharge status: Home / Self Care | Payer: Medicare HMO | Source: Ambulatory Visit | Attending: Internal Medicine | Admitting: Internal Medicine

## 2023-01-02 ENCOUNTER — Other Ambulatory Visit: Payer: Self-pay

## 2023-01-02 VITALS — BP 114/68 | HR 66 | Temp 98.1°F | Resp 22

## 2023-01-02 DIAGNOSIS — S0181XD Laceration without foreign body of other part of head, subsequent encounter: Secondary | ICD-10-CM

## 2023-01-02 DIAGNOSIS — U071 COVID-19: Secondary | ICD-10-CM | POA: Diagnosis not present

## 2023-01-02 NOTE — ED Triage Notes (Signed)
Pt fell last Tuesday- Had stitches placed to right cheek that need removal. +Covid. Had rx for paxlovid but did not take due to milder Sx. Pt also not feeling well. Has memory issues per son at bedside

## 2023-01-02 NOTE — ED Provider Notes (Signed)
[facility]    CSN: [id number] Arrival date & time: 01/02/23  1348      History   Chief Complaint Chief Complaint  Patient presents with   Suture / Staple Removal    COVID +    HPI Trevor Galloway is a 89 y.o. male.   Patient presents with son who provides majority of history given patient has baseline dementia.  Son reports that patient told them this morning that he was generally not feeling well but was not able to characterize what did not feel well.  States that he recently tested positive for COVID-19 at previous ER visit on 12/27/2022.  Reports that he was prescribed Paxlovid but did not administer it given that symptoms were very minimal.  Patient reports that he has a cough here and there but otherwise denies any associated upper respiratory symptoms, fever, shortness of breath.  When patient was asked what did not feel well, he just stated "I just do not feel well".  Son denies any obvious history of asthma or COPD.  Patient is having normal bowel movements and urinating appropriately.  He has been eating and drinking per son report.  Patient also reports that he is here to get sutures removed if they are ready to be removed.  He was seen at recent ED visit for laceration to the face.  He has 5 sutures in place and was told to come back in 1 week to have them removed.   Suture / Staple Removal    Past Medical History:  Diagnosis Date   Atrial flutter (HCC)    s/p CTI ablation 8/11   Cancer [facility])    Prostate cancer   Family hx colonic polyps    H/O prostate cancer 10/04/2015   Hiatal hernia    Hyperlipidemia    Hypertension    RBBB (right bundle branch block)     Patient Active Problem List   Diagnosis Date Noted   Weight loss 09/26/2022   Decreased transfer ability 04/28/2019   Impaired ambulation 04/28/2019   History of non anemic vitamin B12 deficiency 11/23/2017   Cancer (HCC) 07/10/2017   History of cardiac radiofrequency ablation 09/26/2016    Impaired memory 09/26/2016   Vitamin D deficiency 04/19/2016   Vision problems 01/01/2016   Chronic venous stasis dermatitis of both lower extremities 01/01/2016   Glucose intolerance (impaired glucose tolerance) 01/01/2016   LVH (left ventricular hypertrophy) 01/01/2016   Diastolic dysfunction- echo shows L grade I diastolic ventricular dysfunction 01/01/2016   H/O prostate cancer 10/04/2015   h/o Shuffling gait due to OA knees/body 09/16/2013   At high risk for falls 06/17/2013   Chronic fatigue and malaise 04/09/2013   Essential hypertension 11/28/2012   h/o Depression- not current 11/28/2012   chronic B/L Peripheral Edema 11/13/2012   Allergic rhinitis 08/15/2012   Mixed hyperlipidemia 07/05/2010   RIGHT BUNDLE BRANCH BLOCK 12/25/2009   TACHYCARDIA 12/25/2009   Atrial flutter (HCC) 12/24/2009    Past Surgical History:  Procedure Laterality Date   EYE SURGERY     bilateral cataracts   HERNIA REPAIR     PROSTATE SURGERY     seed implant       Home Medications    Prior to Admission medications   Medication Sig Start Date End Date Taking? Authorizing Provider  hydrochlorothiazide (HYDRODIURIL) 12.5 MG tablet Take 1 tablet (12.5 mg total) by mouth daily. 09/26/22   Melida Quitter, PA  metoprolol succinate (TOPROL-XL) 50 MG 24 hr  tablet Take with or immediately following a meal. 09/26/22   Melida Quitter, PA    Family History Family History  Problem Relation Age of Onset   COPD Father    Parkinsonism Sister    Parkinsonism Brother    Cancer Brother        prostate cancer    Social History Social History   Tobacco Use   Smoking status: Former    Current packs/day: 0.00    Types: Cigarettes    Quit date: 09/23/1965    Years since quitting: 57.3    Passive exposure: Past   Smokeless tobacco: Never  Vaping Use   Vaping status: Never Used  Substance Use Topics   Alcohol use: No    Comment: none now   Drug use: No     Allergies   Patient has no  known allergies.   Review of Systems Review of Systems Per HPI  Physical Exam Triage Vital Signs ED Triage Vitals  Encounter Vitals Group     BP 01/02/23 1405 114/68     Systolic BP Percentile --      Diastolic BP Percentile --      Pulse Rate 01/02/23 1405 66     Resp 01/02/23 1405 (!) 22     Temp 01/02/23 1405 98.1 F (36.7 C)     Temp Source 01/02/23 1405 Oral     SpO2 01/02/23 1405 94 %     Weight --      Height --      Head Circumference --      Peak Flow --      Pain Score 01/02/23 1407 0     Pain Loc --      Pain Education --      Exclude from Growth Chart --    No data found.  Updated Vital Signs BP 114/68 (BP Location: Left Arm)   Pulse 66   Temp 98.1 F (36.7 C) (Oral)   Resp (!) 22   SpO2 94%   Visual Acuity Right Eye Distance:   Left Eye Distance:   Bilateral Distance:    Right Eye Near:   Left Eye Near:    Bilateral Near:     Physical Exam Constitutional:      General: He is not in acute distress.    Appearance: Normal appearance. He is not toxic-appearing or diaphoretic.  HENT:     Head: Normocephalic and atraumatic.     Right Ear: Tympanic membrane and ear canal normal.     Left Ear: Tympanic membrane and ear canal normal.     Nose: Nose normal.     Mouth/Throat:     Mouth: Mucous membranes are moist.     Pharynx: No posterior oropharyngeal erythema.  Eyes:     Extraocular Movements: Extraocular movements intact.     Conjunctiva/sclera: Conjunctivae normal.     Pupils: Pupils are equal, round, and reactive to light.  Cardiovascular:     Rate and Rhythm: Normal rate and regular rhythm.     Pulses: Normal pulses.     Heart sounds: Normal heart sounds.  Pulmonary:     Effort: Pulmonary effort is normal. No respiratory distress.     Breath sounds: Normal breath sounds. No stridor. No wheezing, rhonchi or rales.  Abdominal:     General: Bowel sounds are normal. There is no distension.     Tenderness: There is no abdominal tenderness.   Musculoskeletal:        General:  Normal range of motion.  Skin:    General: Skin is warm and dry.     Comments: Patient has approximately 1 inch linear laceration with 5 sutures present to right cheek.  Dried blood noted at lateral end closer to ear.  Dried blood was washed off, and it appears that the 2 sutures on the lateral end were no longer in place.  Wound slightly open at the lateral end but appears to be healing well with no obvious signs of infection.  Other 3 sutures are still in place with close approximation of wound edges.  Neurological:     General: No focal deficit present.     Mental Status: He is alert and oriented to person, place, and time. Mental status is at baseline.  Psychiatric:        Mood and Affect: Mood normal.        Behavior: Behavior normal.        Thought Content: Thought content normal.        Judgment: Judgment normal.      UC Treatments / Results  Labs (all labs ordered are listed, but only abnormal results are displayed) Labs Reviewed  BASIC METABOLIC PANEL  CBC    EKG   Radiology DG Chest 2 View  Result Date: 01/02/2023 CLINICAL DATA:  COVID-19 infection. EXAM: CHEST - 2 VIEW COMPARISON:  November 13, 2012. FINDINGS: The heart size and mediastinal contours are within normal limits. Stable calcified granulomas are noted bilaterally. No acute pulmonary disease is noted. The visualized skeletal structures are unremarkable. IMPRESSION: No active cardiopulmonary disease. Electronically Signed   By: Lupita Raider M.D.   On: 01/02/2023 16:24    Procedures Procedures (including critical care time)  Medications Ordered in UC Medications - No data to display  Initial Impression / Assessment and Plan / UC Course  I have reviewed the triage vital signs and the nursing notes.  Pertinent labs & imaging results that were available during my care of the patient were reviewed by me and considered in my medical decision making (see chart for details).      1.  Suture removal  Laceration was cleaned given noticeable dried blood.  After removal of dried blood, it appears that 2 of the sutures had already come loose.  Wound was slightly open but appears to be healing well so it was covered with a dressing. Do not think additional closure or sutures are needed given duration of time since laceration occurred. Advised the son of daily dressing changes and as needed.  Will leave other 3 sutures in place as I do not think it is time for them to be removed.  Advised following up in 3 days for further evaluation of laceration to see if sutures need to be removed and to make sure it is healing well.  No signs of infection on exam.  2.  COVID-19  I suspect the patient's complaint of not feeling well is due to persistent COVID symptoms.  There are no adventitious lung sounds on exam but chest x-ray completed to rule out pneumonia.  It was negative for any acute cardiopulmonary process. Called son to discuss x-ray results.  Will obtain BMP and CBC as well to rule out any other worrisome abnormalities.  Otherwise, oxygen and vital signs are stable when compared to previous and patient appears physically well on exam so do not think that emergent evaluation is necessary.  Encouraged PCP follow-up as well.  Son verbalized understanding and was  agreeable with plan. Final Clinical Impressions(s) / UC Diagnoses   Final diagnoses:  COVID-19  Facial laceration, subsequent encounter     Discharge Instructions      Chest x-ray and blood work are pending.  We will call with the results.  Recommend following up with primary care doctor as well.  Please return on Wednesday to have facial laceration/wound evaluated.  Leave sutures in for now.  Change dressing daily and as needed if it becomes soiled.    ED Prescriptions   None    PDMP not reviewed this encounter.   Gustavus Bryant, [place] 01/02/23 [phone]

## 2023-01-02 NOTE — Discharge Instructions (Addendum)
Chest x-ray and blood work are pending.  We will call with the results.  Recommend following up with primary care doctor as well.  Please return on Wednesday to have facial laceration/wound evaluated.  Leave sutures in for now.  Change dressing daily and as needed if it becomes soiled.

## 2023-01-03 LAB — BASIC METABOLIC PANEL
BUN/Creatinine Ratio: 25 — ABNORMAL HIGH (ref 10–24)
BUN: 18 mg/dL (ref 10–36)
CO2: 22 mmol/L (ref 20–29)
Calcium: 9.3 mg/dL (ref 8.6–10.2)
Chloride: 105 mmol/L (ref 96–106)
Creatinine, Ser: 0.72 mg/dL — ABNORMAL LOW (ref 0.76–1.27)
Glucose: 95 mg/dL (ref 70–99)
Potassium: 3.7 mmol/L (ref 3.5–5.2)
Sodium: 140 mmol/L (ref 134–144)
eGFR: 85 mL/min/{1.73_m2} (ref >59)

## 2023-01-03 LAB — CBC
Hematocrit: 36 % — ABNORMAL LOW (ref 37.5–51.0)
Hemoglobin: 12.6 g/dL — ABNORMAL LOW (ref 13.0–17.7)
MCH: 32.2 pg (ref 26.6–33.0)
MCHC: 35 g/dL (ref 31.5–35.7)
MCV: 92 fL (ref 79–97)
Platelets: 222 10*3/uL (ref 150–450)
RBC: 3.91 x10E6/uL — ABNORMAL LOW (ref 4.14–5.80)
RDW: 13.8 % (ref 11.6–15.4)
WBC: 7.8 10*3/uL (ref 3.4–10.8)

## 2023-01-04 ENCOUNTER — Ambulatory Visit
Admission: EM | Admit: 2023-01-04 | Discharge: 2023-01-04 | Disposition: A | Discharge status: Home / Self Care | Payer: Medicare HMO | Attending: Family Medicine | Admitting: Family Medicine

## 2023-01-04 ENCOUNTER — Other Ambulatory Visit: Payer: Self-pay | Admitting: Family Medicine

## 2023-01-04 VITALS — BP 160/68 | HR 62 | Temp 98.3°F | Resp 16 | Ht 70.0 in | Wt 154.1 lb

## 2023-01-04 DIAGNOSIS — Z7409 Other reduced mobility: Secondary | ICD-10-CM

## 2023-01-04 DIAGNOSIS — I5189 Other ill-defined heart diseases: Secondary | ICD-10-CM

## 2023-01-04 DIAGNOSIS — R262 Difficulty in walking, not elsewhere classified: Secondary | ICD-10-CM

## 2023-01-04 DIAGNOSIS — S0181XD Laceration without foreign body of other part of head, subsequent encounter: Secondary | ICD-10-CM

## 2023-01-04 DIAGNOSIS — R2689 Other abnormalities of gait and mobility: Secondary | ICD-10-CM

## 2023-01-04 DIAGNOSIS — Z9181 History of falling: Secondary | ICD-10-CM

## 2023-01-04 NOTE — ED Notes (Signed)
Patient's facial wound was evaluated prior to suture removal.

## 2023-01-04 NOTE — ED Triage Notes (Signed)
Here for "Wound recheck" and sutures to be removed. Here with Family member (unaware of history). No fever known.

## 2023-01-05 NOTE — ED Provider Notes (Signed)
[facility]    CSN: [id number] Arrival date & time: 01/04/23  1157      History   Chief Complaint Chief Complaint  Patient presents with   Wound Check    With Suture removal    HPI Trevor Galloway is a 89 y.o. male.   Patient here today for suture removal from facial laceration.  He is does not have any complications.  He has not any drainage or bleeding.  The history is provided by the patient.  Wound Check    Past Medical History:  Diagnosis Date   Atrial flutter [facility])    s/p CTI ablation 8/11   Cancer [facility])    Prostate cancer   Family hx colonic polyps    H/O prostate cancer 10/04/2015   Hiatal hernia    Hyperlipidemia    Hypertension    RBBB (right bundle branch block)     Patient Active Problem List   Diagnosis Date Noted   Weight loss 09/26/2022   Decreased transfer ability 04/28/2019   Impaired ambulation 04/28/2019   History of non anemic vitamin B12 deficiency 11/23/2017   Cancer (HCC) 07/10/2017   History of cardiac radiofrequency ablation 09/26/2016   Impaired memory 09/26/2016   Vitamin D deficiency 04/19/2016   Vision problems 01/01/2016   Chronic venous stasis dermatitis of both lower extremities 01/01/2016   Glucose intolerance (impaired glucose tolerance) 01/01/2016   LVH (left ventricular hypertrophy) 01/01/2016   Diastolic dysfunction- echo shows L grade I diastolic ventricular dysfunction 01/01/2016   H/O prostate cancer 10/04/2015   h/o Shuffling gait due to OA knees/body 09/16/2013   At high risk for falls 06/17/2013   Chronic fatigue and malaise 04/09/2013   Essential hypertension 11/28/2012   h/o Depression- not current 11/28/2012   chronic B/L Peripheral Edema 11/13/2012   Allergic rhinitis 08/15/2012   Mixed hyperlipidemia 07/05/2010   RIGHT BUNDLE BRANCH BLOCK 12/25/2009   TACHYCARDIA 12/25/2009   Atrial flutter (HCC) 12/24/2009    Past Surgical History:  Procedure Laterality Date   EYE SURGERY      bilateral cataracts   HERNIA REPAIR     PROSTATE SURGERY     seed implant       Home Medications    Prior to Admission medications   Medication Sig Start Date End Date Taking? Authorizing Provider  hydrochlorothiazide (HYDRODIURIL) 12.5 MG tablet Take 1 tablet (12.5 mg total) by mouth daily. 09/26/22   Melida Quitter, PA  metoprolol succinate (TOPROL-XL) 50 MG 24 hr tablet Take with or immediately following a meal. 09/26/22   Melida Quitter, PA    Family History Family History  Problem Relation Age of Onset   COPD Father    Parkinsonism Sister    Parkinsonism Brother    Cancer Brother        prostate cancer    Social History Social History   Tobacco Use   Smoking status: Former    Current packs/day: 0.00    Types: Cigarettes    Quit date: 09/23/1965    Years since quitting: 57.3    Passive exposure: Past   Smokeless tobacco: Never  Vaping Use   Vaping status: Never Used  Substance Use Topics   Alcohol use: No    Comment: none now   Drug use: No     Allergies   Patient has no known allergies.   Review of Systems Review of Systems  Constitutional:  Negative for chills and fever.  Eyes:  Negative for  discharge and redness.  Skin:  Positive for wound. Negative for color change.  Neurological:  Negative for numbness.     Physical Exam Triage Vital Signs ED Triage Vitals  Encounter Vitals Group     BP 01/04/23 1310 (!) 171/72     Systolic BP Percentile --      Diastolic BP Percentile --      Pulse Rate 01/04/23 1310 62     Resp 01/04/23 1310 16     Temp 01/04/23 1310 98.3 F (36.8 C)     Temp Source 01/04/23 1310 Oral     SpO2 01/04/23 1310 95 %     Weight 01/04/23 1310 154 lb 1.6 oz (69.9 kg)     Height 01/04/23 1310 5\' 10"  (1.778 m)     Head Circumference --      Peak Flow --      Pain Score 01/04/23 1304 0     Pain Loc --      Pain Education --      Exclude from Growth Chart --    No data found.  Updated Vital Signs BP (!) 160/68  (BP Location: Left Arm)   Pulse 62   Temp 98.3 F (36.8 C) (Oral)   Resp 16   Ht 5\' 10"  (1.778 m)   Wt 154 lb 1.6 oz (69.9 kg)   SpO2 95%   BMI 22.11 kg/m     Physical Exam Vitals and nursing note reviewed.  Constitutional:      General: He is not in acute distress.    Appearance: Normal appearance. He is not ill-appearing.  HENT:     Head: Normocephalic and atraumatic.  Eyes:     Conjunctiva/sclera: Conjunctivae normal.  Cardiovascular:     Rate and Rhythm: Normal rate.  Pulmonary:     Effort: Pulmonary effort is normal. No respiratory distress.  Skin:    Comments: Laceration to right maxillary area with three intact sutures, no active bleeding or drainage.  Neurological:     Mental Status: He is alert.  Psychiatric:        Mood and Affect: Mood normal.        Behavior: Behavior normal.        Thought Content: Thought content normal.      UC Treatments / Results  Labs (all labs ordered are listed, but only abnormal results are displayed) Labs Reviewed - No data to display  EKG   Radiology No results found.  Procedures Procedures (including critical care time)  Medications Ordered in UC Medications - No data to display  Initial Impression / Assessment and Plan / UC Course  I have reviewed the triage vital signs and the nursing notes.  Pertinent labs & imaging results that were available during my care of the patient were reviewed by me and considered in my medical decision making (see chart for details).    Sutures removed without complication.  Encouraged follow-up with any further concerns or questions.  Final Clinical Impressions(s) / UC Diagnoses   Final diagnoses:  Facial laceration, subsequent encounter   Discharge Instructions   None    ED Prescriptions   None    PDMP not reviewed this encounter.   Tomi Bamberger, PA-C 01/05/23 [phone]

## 2023-05-24 ENCOUNTER — Other Ambulatory Visit: Payer: Self-pay | Admitting: *Deleted

## 2023-05-24 DIAGNOSIS — R000 Tachycardia, unspecified: Secondary | ICD-10-CM

## 2023-05-24 DIAGNOSIS — I10 Essential (primary) hypertension: Secondary | ICD-10-CM

## 2023-05-24 MED ORDER — HYDROCHLOROTHIAZIDE 12.5 MG PO TABS: 12.5000 mg | ORAL_TABLET | Freq: Every day | ORAL | 3 refills | Status: DC

## 2023-05-24 MED ORDER — METOPROLOL SUCCINATE ER 50 MG PO TB24
ORAL_TABLET | ORAL | 3 refills | Status: DC
Start: 2023-05-24 — End: 2024-01-29

## 2023-05-25 ENCOUNTER — Ambulatory Visit (INDEPENDENT_AMBULATORY_CARE_PROVIDER_SITE_OTHER)

## 2023-05-25 DIAGNOSIS — Z0000 Encounter for general adult medical examination without abnormal findings: Secondary | ICD-10-CM | POA: Diagnosis not present

## 2023-05-25 NOTE — Patient Instructions (Addendum)
 Mr. Trevor Galloway , Thank you for taking time to come for your Medicare Wellness Visit. I appreciate your ongoing commitment to your health goals. Please review the following plan we discussed and let me know if I can assist you in the future.   Referrals/Orders/Follow-Ups/Clinician Recommendations: none  This is a list of the screening recommended for you and due dates:  Health Maintenance  Topic Date Due   Zoster (Shingles) Vaccine (1 of 2) Never done   COVID-19 Vaccine (3 - Pfizer risk series) 09/06/2019   Flu Shot  12/15/2022   Medicare Annual Wellness Visit  05/24/2024   DTaP/Tdap/Td vaccine (3 - Td or Tdap) 12/26/2032   Pneumonia Vaccine  Completed   HPV Vaccine  Aged Out    Advanced directives: (Copy Requested) Please bring a copy of your health care power of attorney and living will to the office to be added to your chart at your convenience.  Next Medicare Annual Wellness Visit scheduled for next year: No, will schedule next year  insert Preventive Care attachment Insert FALL PREVENTION attachment if needed

## 2023-05-25 NOTE — Progress Notes (Signed)
 Subjective:   Trevor Galloway is a 89 y.o. male who presents for Medicare Annual/Subsequent preventive examination.  Visit Complete: Virtual I connected with  Trevor Galloway on 05/25/23 by a audio enabled telemedicine application and verified that I am speaking with the correct person using two identifiers. Son was also on call.  Interactive audio and video telecommunications were attempted between this provider and patient, however failed, due to patient having technical difficulties OR patient did not have access to video capability.  We continued and completed visit with audio only.  Patient Location: Home  Provider Location: Office/Clinic  I discussed the limitations of evaluation and management by telemedicine. The patient expressed understanding and agreed to proceed.  Vital Signs: Because this visit was a virtual/telehealth visit, some criteria may be missing or patient reported. Any vitals not documented were not able to be obtained and vitals that have been documented are patient reported.    Cardiac Risk Factors include: advanced age (>24men, >45 women);dyslipidemia;hypertension;male gender     Objective:    Today's Vitals   There is no height or weight on file to calculate BMI.     05/25/2023    1:09 PM 01/01/2016    9:48 AM 05/18/2012    3:02 AM  Advanced Directives  Does Patient Have a Medical Advance Directive? Yes Yes Patient has advance directive, copy not in chart  Type of Advance Directive Healthcare Power of [place];Living will Living will;Healthcare Power of [employer] Power of [place];Living will  Copy of Healthcare Power of Attorney in Chart? No - copy requested  Copy requested from family  Pre-existing out of facility DNR order (yellow form or pink MOST form)   No    Current Medications (verified) Outpatient Encounter Medications as of 05/25/2023  Medication Sig   hydrochlorothiazide  (HYDRODIURIL ) 12.5 MG tablet Take 1 tablet (12.5 mg total)  by mouth daily.   metoprolol  succinate (TOPROL -XL) 50 MG 24 hr tablet Take with or immediately following a meal.   No facility-administered encounter medications on file as of 05/25/2023.    Allergies (verified) Patient has no known allergies.   History: Past Medical History:  Diagnosis Date   Atrial flutter (HCC)    s/p CTI ablation 8/11   Cancer [facility])    Prostate cancer   Family hx colonic polyps    H/O prostate cancer 10/04/2015   Hiatal hernia    Hyperlipidemia    Hypertension    RBBB (right bundle branch block)    Past Surgical History:  Procedure Laterality Date   EYE SURGERY     bilateral cataracts   HERNIA REPAIR     PROSTATE SURGERY     seed implant   Family History  Problem Relation Age of Onset   COPD Father    Parkinsonism Sister    Parkinsonism Brother    Cancer Brother        prostate cancer   Social History   Socioeconomic History   Marital status: Married    Spouse name: Not on file   Number of children: Not on file   Years of education: Not on file   Highest education level: Not on file  Occupational History   Occupation: retired    Associate Professor: RETIRED    Comment: trucking business  Tobacco Use   Smoking status: Former    Current packs/day: 0.00    Types: Cigarettes    Quit date: 09/23/1965    Years since quitting: 57.7    Passive exposure: Past  Smokeless tobacco: Never  Vaping Use   Vaping status: Never Used  Substance and Sexual Activity   Alcohol use: No    Comment: none now   Drug use: No   Sexual activity: Not Currently  Other Topics Concern   Not on file  Social History Narrative   Not on file   Social Drivers of Health   Financial Resource Strain: Low Risk  (05/25/2023)   Overall Financial Resource Strain (CARDIA)    Difficulty of Paying Living Expenses: Not hard at all  Food Insecurity: No Food Insecurity (05/25/2023)   Hunger Vital Sign    Worried About Running Out of Food in the Last Year: Never true    Ran Out of Food  in the Last Year: Never true  Transportation Needs: No Transportation Needs (05/25/2023)   PRAPARE - Administrator, Civil Service (Medical): No    Lack of Transportation (Non-Medical): No  Physical Activity: Inactive (05/25/2023)   Exercise Vital Sign    Days of Exercise per Week: 0 days    Minutes of Exercise per Session: 0 min  Stress: No Stress Concern Present (05/25/2023)   [employer] of Occupational Health - Occupational Stress Questionnaire    Feeling of Stress : Not at all  Social Connections: Moderately Isolated (05/25/2023)   Social Connection and Isolation Panel [NHANES]    Frequency of Communication with Friends and Family: More than three times a week    Frequency of Social Gatherings with Friends and Family: More than three times a week    Attends Religious Services: Never    Database Administrator or Organizations: No    Attends Engineer, Structural: Never    Marital Status: Married    Tobacco Counseling Counseling given: Not Answered   Clinical Intake:  Pre-visit preparation completed: Yes  Pain : No/denies pain     Nutritional Risks: None Diabetes: No  How often do you need to have someone help you when you read instructions, pamphlets, or other written materials from your doctor or pharmacy?: 1 - Never  Interpreter Needed?: No  Information entered by :: NAllen LPN   Activities of Daily Living    05/25/2023    1:04 PM  In your present state of health, do you have any difficulty performing the following activities:  Hearing? 0  Vision? 1  Comment macular degeneration  Difficulty concentrating or making decisions? 1  Walking or climbing stairs? 1  Dressing or bathing? 1  Doing errands, shopping? 1  Preparing Food and eating ? N  Using the Toilet? N  In the past six months, have you accidently leaked urine? Y  Do you have problems with loss of bowel control? N  Managing your Medications? Y  Managing your Finances? Y   Housekeeping or managing your Housekeeping? Y    Patient Care Team: Wallace Joesph LABOR, PA as PCP - General (Family Medicine) Alline Lenis, MD (Inactive) as Consulting Physician (Urology) Jarold Mayo, MD as Consulting Physician (Ophthalmology) Ivin Kocher, MD as Consulting Physician (Dermatology) Yvone Rush, MD as Consulting Physician (Orthopedic Surgery) Tat, Asberry RAMAN, DO as Consulting Physician (Neurology) Kelsie Agent, MD (Inactive) as Consulting Physician (Cardiology) Devere Lonni Righter, MD as Consulting Physician (Urology) Beverli Cara PARAS, [facility] (Inactive) as Pharmacist (Pharmacist)  Indicate any recent Medical Services you may have received from other than [facility] providers in the past year (date may be approximate).     Assessment:   This is a routine wellness examination for  Trevor Galloway.  Hearing/Vision screen Hearing Screening - Comments:: Denies hearing issues Vision Screening - Comments:: No longer sees eye doctor   Goals Addressed             This Visit's Progress    Patient Stated       05/25/2023, denies goals       Depression Screen    05/25/2023    1:11 PM 09/26/2022   10:37 AM 05/17/2022    1:20 PM 09/22/2021    9:37 AM 05/05/2021   10:07 AM 11/26/2020    3:04 PM 05/04/2020   11:40 AM  PHQ 2/9 Scores  PHQ - 2 Score 0  1 0 0  4  PHQ- 9 Score   3 1 0  8  Exception Documentation  Patient refusal    Patient refusal     Fall Risk    05/25/2023    1:10 PM 09/26/2022   10:58 AM 05/17/2022    1:23 PM 09/22/2021    9:36 AM 05/05/2021   10:03 AM  Fall Risk   Falls in the past year? 1 0 1 0 0  Comment lost balance      Number falls in past yr: 1 0 1 0 0  Injury with Fall? 1 0 1 1 0  Comment got stitches      Risk for fall due to : Medication side effect;Impaired vision;Impaired mobility;Impaired balance/gait;History of fall(s) Impaired balance/gait;Impaired mobility  Impaired balance/gait;Impaired mobility Impaired balance/gait  Follow up Falls  prevention discussed;Falls evaluation completed Falls evaluation completed Falls evaluation completed Falls evaluation completed Falls evaluation completed    MEDICARE RISK AT HOME: Medicare Risk at Home Any stairs in or around the home?: Yes If so, are there any without handrails?: No Home free of loose throw rugs in walkways, pet beds, electrical cords, etc?: Yes Adequate lighting in your home to reduce risk of falls?: Yes Life alert?: No Use of a cane, walker or w/c?: Yes Grab bars in the bathroom?: Yes Shower chair or bench in shower?: Yes Elevated toilet seat or a handicapped toilet?: Yes  TIMED UP AND GO:  Was the test performed?  No    Cognitive Function:  6 CIT not administered. Patient has diagnosis of memory impairment.      04/25/2019    3:22 PM 04/25/2019    3:00 PM  MMSE - Mini Mental State Exam  Orientation to time 3 3  Orientation to Place 5 5  Attention/ Calculation 4   Recall 0         05/17/2022    1:10 PM 05/05/2021   10:08 AM 05/04/2020   11:43 AM 04/25/2019    2:53 PM  6CIT Screen  What Year? 4 points 4 points 4 points 4 points  What month? 3 points 0 points 0 points 0 points  What time? 0 points 3 points 0 points 3 points  Count back from 20 2 points 0 points 0 points 2 points  Months in reverse 4 points 4 points 4 points 4 points  Repeat phrase 2 points 6 points 10 points 10 points  Total Score 15 points 17 points 18 points 23 points    Immunizations Immunization History  Administered Date(s) Administered   Fluad Quad(high Dose 65+) 03/06/2019, 03/05/2020, 03/10/2021   Influenza Whole 02/13/2009   Influenza, High Dose Seasonal PF 03/28/2014, 02/12/2018   Influenza,inj,Quad PF,6+ Mos 02/18/2013, 02/07/2015   Influenza-Unspecified 02/10/2016, 02/12/2018, 03/10/2021, 02/25/2022   PFIZER(Purple Top)SARS-COV-2 Vaccination 07/14/2019, 08/09/2019   Pneumococcal  Conjugate-13 03/25/2014, 02/22/2017   Pneumococcal Polysaccharide-23 03/30/2015    Tdap 01/31/2012, 12/27/2022    TDAP status: Up to date  Flu Vaccine status: Due, Education has been provided regarding the importance of this vaccine. Advised may receive this vaccine at local pharmacy or Health Dept. Aware to provide a copy of the vaccination record if obtained from local pharmacy or Health Dept. Verbalized acceptance and understanding.  Pneumococcal vaccine status: Up to date  Covid-19 vaccine status: Information provided on how to obtain vaccines.   Qualifies for Shingles Vaccine? Yes   Zostavax completed No   Shingrix  Completed?: No.    Education has been provided regarding the importance of this vaccine. Patient has been advised to call insurance company to determine out of pocket expense if they have not yet received this vaccine. Advised may also receive vaccine at local pharmacy or Health Dept. Verbalized acceptance and understanding.  Screening Tests Health Maintenance  Topic Date Due   Zoster Vaccines- Shingrix  (1 of 2) Never done   COVID-19 Vaccine (3 - Pfizer risk series) 09/06/2019   INFLUENZA VACCINE  12/15/2022   Medicare Annual Wellness (AWV)  05/24/2024   DTaP/Tdap/Td (3 - Td or Tdap) 12/26/2032   Pneumonia Vaccine 10+ Years old  Completed   HPV VACCINES  Aged Out    Health Maintenance  Health Maintenance Due  Topic Date Due   Zoster Vaccines- Shingrix  (1 of 2) Never done   COVID-19 Vaccine (3 - Pfizer risk series) 09/06/2019   INFLUENZA VACCINE  12/15/2022    Colorectal cancer screening: No longer required.   Lung Cancer Screening: (Low Dose CT Chest recommended if Age 7-80 years, 20 pack-year currently smoking OR have quit w/in 15years.) does not qualify.   Lung Cancer Screening Referral: no  Additional Screening:  Hepatitis C Screening: does not qualify;  Vision Screening: Recommended annual ophthalmology exams for early detection of glaucoma and other disorders of the eye. Is the patient up to date with their annual eye exam?  No   Who is the provider or what is the name of the office in which the patient attends annual eye exams? none If pt is not established with a provider, would they like to be referred to a provider to establish care? No .   Dental Screening: Recommended annual dental exams for proper oral hygiene  Diabetic Foot Exam: n/a  Community Resource Referral / Chronic Care Management: CRR required this visit?  No   CCM required this visit?  No     Plan:     I have personally reviewed and noted the following in the patient's chart:   Medical and social history Use of alcohol, tobacco or illicit drugs  Current medications and supplements including opioid prescriptions. Patient is not currently taking opioid prescriptions. Functional ability and status Nutritional status Physical activity Advanced directives List of other physicians Hospitalizations, surgeries, and ER visits in previous 12 months Vitals Screenings to include cognitive, depression, and falls Referrals and appointments  In addition, I have reviewed and discussed with patient certain preventive protocols, quality metrics, and best practice recommendations. A written personalized care plan for preventive services as well as general preventive health recommendations were provided to patient.     Ardella FORBES Dawn, LPN   8/0/7974   After Visit Summary: (MyChart) Due to this being a telephonic visit, the after visit summary with patients personalized plan was offered to patient via MyChart   Nurse Notes: none

## 2023-06-22 ENCOUNTER — Encounter: Payer: Self-pay | Admitting: Family Medicine

## 2023-08-23 ENCOUNTER — Telehealth: Payer: Self-pay | Admitting: *Deleted

## 2023-08-23 NOTE — Telephone Encounter (Signed)
 Copied from CRM [id number]. Topic: General - Other >> Aug 23, 2023  9:11 AM [name] wrote: Reason for CRM: Daughter requesting note to give trash collection people so that they can help patient. Note is needed as patient has difficulty taking trash cans to curb and would need the people collecting trash to walk up and pick up trash.   Daughter requesting note for spouse, Trevor Galloway as well, can be put in one note as they are in same household. See CRM: [id number] Please assist pt further,   Daughter requesting callback, [phone]

## 2023-08-24 NOTE — Telephone Encounter (Signed)
 Both letters have been sent via MyChart.  If they need printed copies they can come pick them up

## 2023-08-24 NOTE — Telephone Encounter (Signed)
 Informed pt daughter of below and printed them for her to pick up.

## 2023-09-13 ENCOUNTER — Encounter: Payer: Self-pay | Admitting: Family Medicine

## 2023-09-13 ENCOUNTER — Ambulatory Visit: Admitting: Family Medicine

## 2023-09-13 ENCOUNTER — Ambulatory Visit (INDEPENDENT_AMBULATORY_CARE_PROVIDER_SITE_OTHER): Admitting: Family Medicine

## 2023-09-13 VITALS — BP 135/88 | HR 82 | Wt 169.1 lb

## 2023-09-13 DIAGNOSIS — E782 Mixed hyperlipidemia: Secondary | ICD-10-CM | POA: Diagnosis not present

## 2023-09-13 DIAGNOSIS — E559 Vitamin D deficiency, unspecified: Secondary | ICD-10-CM

## 2023-09-13 DIAGNOSIS — F0390 Unspecified dementia without behavioral disturbance: Secondary | ICD-10-CM

## 2023-09-13 DIAGNOSIS — I484 Atypical atrial flutter: Secondary | ICD-10-CM | POA: Diagnosis not present

## 2023-09-13 DIAGNOSIS — I10 Essential (primary) hypertension: Secondary | ICD-10-CM | POA: Diagnosis not present

## 2023-09-13 DIAGNOSIS — Z111 Encounter for screening for respiratory tuberculosis: Secondary | ICD-10-CM

## 2023-09-13 NOTE — Assessment & Plan Note (Signed)
 Patient will go to [facility].  Wife recently died and he currently lives alone but has 16-hour a day assistance.  Filled out FL 2 paperwork.  Getting TB test.

## 2023-09-13 NOTE — Assessment & Plan Note (Signed)
 Rate is regular.  On metoprolol .  anticoagulants not appropriate in his situation.

## 2023-09-13 NOTE — Patient Instructions (Signed)
 It was nice to see you today,  We addressed the following topics today: -We will get your lab test today and we will let you know the results when you get them - I feel that your paperwork.  If there is anything else you need to fill out or read to let us  know. - Continue taking the medications you are currently on.  No changes to any of your medicines today.  Have a great day,  Etha Henle, MD

## 2023-09-13 NOTE — Assessment & Plan Note (Signed)
 Controlled.  Continue metoprolol  and HCTZ.

## 2023-09-13 NOTE — Progress Notes (Signed)
   Established Patient Office Visit  Subjective   Patient ID: Trevor Galloway, male    DOB: 1927-01-10  Age: 89 y.o. MRN: [id number]  Chief Complaint  Patient presents with   medicatioon management   Form Completion    HPI Subjective: - Coming in to fill out paperwork for admission to [facility] facility - PMHx: memory loss, dementia, hypertension, atrial flutter - Meds: hydrochlorothiazide  12.5mg , metoprolol  50mg  - No allergies - Currently living at home with caregivers 16 hrs/day, 7 days/wk - Wife recently passed - Episode of wandering at night - found sitting in ditch - No verbal or physical aggression - Not oriented to time/date - [facility] - nurse visits weekly, aide comes twice weekly for shower - Ambulates with walker - Feeds self but cannot prepare meals - Macular degeneration - No seizures - Bladder and bowel incontinence - wears depends - Able to communicate needs verbally - No dietary restrictions  Review of Systems: - Constitutional symptoms: No pain reported - Eyes: Macular degeneration - Ears, Nose, Mouth, Throat:  - Cardiovascular: Hx of atrial flutter, hypertension - Respiratory:  - Gastrointestinal: Bowel incontinence - Genitourinary: Bladder incontinence - Musculoskeletal: Ambulates with walker - Integumentary (Skin):  - Neurological: Memory loss, disorientation to time/place - Psychiatric: Dementia - Endocrine:  - Hematologic/Lymphatic:  - Allergic/Immunologic: No known allergies  The ASCVD Risk score (Arnett DK, et al., 2019) failed to calculate for the following reasons:   The 2019 ASCVD risk score is only valid for ages 74 to 60  Health Maintenance Due  Topic Date Due   Zoster Vaccines- Shingrix  (1 of 2) Never done   COVID-19 Vaccine (4 - 2024-25 season) 01/15/2023      Objective:     BP 135/88   Pulse 82   Wt 169 lb 1.9 oz (76.7 kg)   SpO2 100%   BMI 24.27 kg/m    Physical  Exam General: Alert, oriented to person, situation. CV: Regarding rhythm no murmurs Pulmonary: Lungs clear bilaterally MSK: Ambulates with walker.  Slowed gait. Psych: Pleasant.  Able to answer questions appropriately.  Able to remember his age and birthday. Skin: Dry flaky skin.   No results found for any visits on 09/13/23.      Assessment & Plan:   Dementia, unspecified dementia severity, unspecified dementia type, unspecified whether behavioral, psychotic, or mood disturbance or anxiety [facility]) Assessment & Plan: Patient will [facility].  Wife recently died and he currently lives alone but has 16-hour a day assistance.  Filled out FL 2 paperwork.  Getting TB test.   Atypical atrial flutter (HCC) Assessment & Plan: Rate is regular.  On metoprolol .  anticoagulants not appropriate in his situation.   Mixed hyperlipidemia -     Comprehensive metabolic panel with GFR; Future -     Lipid panel; Future -     Hemoglobin A1c; Future  Essential hypertension -     CBC with Differential/Platelet; Future -     Comprehensive metabolic panel with GFR; Future -     TSH; Future  Vitamin D  deficiency -     VITAMIN D  25 Hydroxy (Vit-D Deficiency, Fractures); Future  Screening-pulmonary TB -     QuantiFERON-TB Gold Plus; Future     No follow-ups on file.    Laneta Pintos, MD

## 2023-09-14 ENCOUNTER — Telehealth: Payer: Self-pay

## 2023-09-14 NOTE — Telephone Encounter (Signed)
 Pt son Trevor Galloway called back lab results were given. He would like the levothyroxine  75 mcg  to be sent to: [id number] - [place], [place] - [address] AT [address]  For a 30 day supply and a 90 day supply sent to: [facility] - [place], [place] - [address] [place].  They are willing to start the OTC Vit D3.  Trevor Galloway also start that if Trevor Galloway is not in facility before the 6-8 weeks he would bring him back out for the lab but he would call closer to that visit.

## 2023-09-15 ENCOUNTER — Ambulatory Visit

## 2023-09-15 MED ORDER — LEVOTHYROXINE SODIUM 75 MCG PO TABS: 75.0000 ug | ORAL_TABLET | Freq: Every day | ORAL | 3 refills | Status: DC

## 2023-09-15 NOTE — Addendum Note (Signed)
 Addended by: Laneta Pintos on: 09/15/2023 07:02 AM   Modules accepted: Orders

## 2023-09-15 NOTE — Telephone Encounter (Signed)
 The thyroid  medicine has been sent in.  We should probably update that FL 2 to include his new medication.  If we still have the 1 from yesterday I can resend it.

## 2023-09-16 LAB — CBC WITH DIFFERENTIAL/PLATELET
Basophils Absolute: 0.1 10*3/uL (ref 0.0–0.2)
Basos: 1 %
EOS (ABSOLUTE): 0.1 10*3/uL (ref 0.0–0.4)
Eos: 1 %
Hematocrit: 41.8 % (ref 37.5–51.0)
Hemoglobin: 14.3 g/dL (ref 13.0–17.7)
Immature Grans (Abs): 0.1 10*3/uL (ref 0.0–0.1)
Immature Granulocytes: 1 %
Lymphocytes Absolute: 1.4 10*3/uL (ref 0.7–3.1)
Lymphs: 19 %
MCH: 32.1 pg (ref 26.6–33.0)
MCHC: 34.2 g/dL (ref 31.5–35.7)
MCV: 94 fL (ref 79–97)
Monocytes Absolute: 1 10*3/uL — ABNORMAL HIGH (ref 0.1–0.9)
Monocytes: 14 %
Neutrophils Absolute: 4.9 10*3/uL (ref 1.4–7.0)
Neutrophils: 64 %
Platelets: 208 10*3/uL (ref 150–450)
RBC: 4.45 x10E6/uL (ref 4.14–5.80)
RDW: 13.4 % (ref 11.6–15.4)
WBC: 7.5 10*3/uL (ref 3.4–10.8)

## 2023-09-16 LAB — QUANTIFERON-TB GOLD PLUS
QuantiFERON Nil Value: 0.05 [IU]/mL
QuantiFERON TB1 Ag Value: 0.22 [IU]/mL
QuantiFERON TB2 Ag Value: 0.12 [IU]/mL

## 2023-09-16 LAB — LIPID PANEL
Chol/HDL Ratio: 3.6 ratio (ref 0.0–5.0)
Cholesterol, Total: 172 mg/dL (ref 100–199)
HDL: 48 mg/dL (ref >39)
LDL Chol Calc (NIH): 107 mg/dL — ABNORMAL HIGH (ref 0–99)
Triglycerides: 92 mg/dL (ref 0–149)
VLDL Cholesterol Cal: 17 mg/dL (ref 5–40)

## 2023-09-16 LAB — COMPREHENSIVE METABOLIC PANEL WITH GFR
ALT: 12 IU/L (ref 0–44)
AST: 17 IU/L (ref 0–40)
Albumin: 4 g/dL (ref 3.6–4.6)
Alkaline Phosphatase: 88 IU/L (ref 44–121)
BUN/Creatinine Ratio: 15 (ref 10–24)
BUN: 13 mg/dL (ref 10–36)
Bilirubin Total: 0.5 mg/dL (ref 0.0–1.2)
CO2: 24 mmol/L (ref 20–29)
Calcium: 9.8 mg/dL (ref 8.6–10.2)
Chloride: 100 mmol/L (ref 96–106)
Creatinine, Ser: 0.88 mg/dL (ref 0.76–1.27)
Globulin, Total: 2.4 g/dL (ref 1.5–4.5)
Glucose: 101 mg/dL — ABNORMAL HIGH (ref 70–99)
Potassium: 4.6 mmol/L (ref 3.5–5.2)
Sodium: 136 mmol/L (ref 134–144)
Total Protein: 6.4 g/dL (ref 6.0–8.5)
eGFR: 80 mL/min/{1.73_m2} (ref >59)

## 2023-09-16 LAB — HEMOGLOBIN A1C
Est. average glucose Bld gHb Est-mCnc: 123 mg/dL
Hgb A1c MFr Bld: 5.9 % — ABNORMAL HIGH (ref 4.8–5.6)

## 2023-09-16 LAB — TSH: TSH: 25.2 u[IU]/mL — ABNORMAL HIGH (ref 0.450–4.500)

## 2023-09-16 LAB — VITAMIN D 25 HYDROXY (VIT D DEFICIENCY, FRACTURES): Vit D, 25-Hydroxy: 20.4 ng/mL — ABNORMAL LOW (ref 30.0–100.0)

## 2023-09-18 ENCOUNTER — Encounter: Payer: Self-pay | Admitting: Family Medicine

## 2023-09-27 ENCOUNTER — Encounter: Payer: Medicare HMO | Admitting: Family Medicine

## 2023-09-27 ENCOUNTER — Encounter: Admitting: Family Medicine

## 2023-10-15 ENCOUNTER — Emergency Department (HOSPITAL_COMMUNITY)

## 2023-10-15 ENCOUNTER — Other Ambulatory Visit: Payer: Self-pay

## 2023-10-15 ENCOUNTER — Encounter (HOSPITAL_COMMUNITY): Payer: Self-pay | Admitting: *Deleted

## 2023-10-15 ENCOUNTER — Emergency Department (HOSPITAL_COMMUNITY)
Admission: EM | Admit: 2023-10-15 | Discharge: 2023-10-15 | Disposition: A | Discharge status: Skilled Nursing Facility | Attending: Emergency Medicine | Admitting: Emergency Medicine

## 2023-10-15 DIAGNOSIS — S51812A Laceration without foreign body of left forearm, initial encounter: Secondary | ICD-10-CM | POA: Diagnosis not present

## 2023-10-15 DIAGNOSIS — F0390 Unspecified dementia without behavioral disturbance: Secondary | ICD-10-CM | POA: Diagnosis not present

## 2023-10-15 DIAGNOSIS — S0990XA Unspecified injury of head, initial encounter: Secondary | ICD-10-CM | POA: Diagnosis not present

## 2023-10-15 DIAGNOSIS — S51012A Laceration without foreign body of left elbow, initial encounter: Secondary | ICD-10-CM | POA: Diagnosis not present

## 2023-10-15 DIAGNOSIS — M19032 Primary osteoarthritis, left wrist: Secondary | ICD-10-CM | POA: Diagnosis not present

## 2023-10-15 DIAGNOSIS — W19XXXA Unspecified fall, initial encounter: Secondary | ICD-10-CM

## 2023-10-15 DIAGNOSIS — S06310A Contusion and laceration of right cerebrum without loss of consciousness, initial encounter: Secondary | ICD-10-CM | POA: Diagnosis not present

## 2023-10-15 DIAGNOSIS — G319 Degenerative disease of nervous system, unspecified: Secondary | ICD-10-CM | POA: Diagnosis not present

## 2023-10-15 DIAGNOSIS — I10 Essential (primary) hypertension: Secondary | ICD-10-CM | POA: Diagnosis not present

## 2023-10-15 DIAGNOSIS — W06XXXA Fall from bed, initial encounter: Secondary | ICD-10-CM | POA: Insufficient documentation

## 2023-10-15 DIAGNOSIS — R609 Edema, unspecified: Secondary | ICD-10-CM | POA: Diagnosis not present

## 2023-10-15 DIAGNOSIS — M85832 Other specified disorders of bone density and structure, left forearm: Secondary | ICD-10-CM | POA: Diagnosis not present

## 2023-10-15 LAB — BASIC METABOLIC PANEL WITH GFR
Anion gap: 10 (ref 5–15)
BUN: 12 mg/dL (ref 8–23)
CO2: 24 mmol/L (ref 22–32)
Calcium: 9.6 mg/dL (ref 8.9–10.3)
Chloride: 99 mmol/L (ref 98–111)
Creatinine, Ser: 0.73 mg/dL (ref 0.61–1.24)
GFR, Estimated: >60 mL/min (ref >60)
Glucose, Bld: 103 mg/dL — ABNORMAL HIGH (ref 70–99)
Potassium: 3.7 mmol/L (ref 3.5–5.1)
Sodium: 133 mmol/L — ABNORMAL LOW (ref 135–145)

## 2023-10-15 LAB — CBC
HCT: 38.2 % — ABNORMAL LOW (ref 39.0–52.0)
Hemoglobin: 13.4 g/dL (ref 13.0–17.0)
MCH: 32.3 pg (ref 26.0–34.0)
MCHC: 35.1 g/dL (ref 30.0–36.0)
MCV: 92 fL (ref 80.0–100.0)
Platelets: 199 10*3/uL (ref 150–400)
RBC: 4.15 MIL/uL — ABNORMAL LOW (ref 4.22–5.81)
RDW: 13.6 % (ref 11.5–15.5)
WBC: 7.7 10*3/uL (ref 4.0–10.5)
nRBC: 0 % (ref 0.0–0.2)

## 2023-10-15 LAB — CK: Total CK: 63 U/L (ref 49–397)

## 2023-10-15 NOTE — ED Provider Notes (Signed)
 [facility] Provider Note   CSN: [id number] Arrival date & time: 10/15/23  4098     History  Chief Complaint  Patient presents with   Coleridge Galloway    Trevor Galloway is a 89 y.o. male with medical history significant for atrial flutter, hypertension, hiatal hernia, chronic fatigue malaise, dementia.  Patient presents to ED for evaluation of fall.  Reports that he fell out of bed this morning while sleeping.  He states he did strike his head during this fall but denies taking blood thinning medications.  He states he was on the ground for an unknown amount of time but reports he was on the ground for extended period of time.  He has a skin tear to his left forearm.  He denies any abdominal pain.  Denies any pain to his bilateral upper extremities.  Denies any pain to his bilateral lower extremities.  Denies any pelvis pain.  Patient arrives in a c-collar.  Denies any chest pain or shortness of breath prior to the fall.   Fall       Home Medications Prior to Admission medications   Medication Sig Start Date End Date Taking? Authorizing Provider  hydrochlorothiazide  (HYDRODIURIL ) 12.5 MG tablet Take 1 tablet (12.5 mg total) by mouth daily. 05/24/23   Noreene Bearded, PA  levothyroxine  (SYNTHROID ) 75 MCG tablet Take 1 tablet (75 mcg total) by mouth daily. 09/15/23   Laneta Pintos, MD  metoprolol  succinate (TOPROL -XL) 50 MG 24 hr tablet Take with or immediately following a meal. 05/24/23   Noreene Bearded, PA      Allergies    Patient has no known allergies.    Review of Systems   Review of Systems  Unable to perform ROS: Dementia (Level 5 caveat)  All other systems reviewed and are negative.   Physical Exam Updated Vital Signs BP 137/86   Pulse 77   Temp (!) 97.2 F (36.2 C)   Resp 13   SpO2 99%  Physical Exam Vitals and nursing note reviewed.  Constitutional:      General: He is not in acute distress.    Appearance: He is  well-developed.  HENT:     Head: Normocephalic and atraumatic.  Eyes:     Conjunctiva/sclera: Conjunctivae normal.  Neck:     Comments: Patient in c-collar.   Cardiovascular:     Rate and Rhythm: Normal rate and regular rhythm.     Heart sounds: No murmur heard. Pulmonary:     Effort: Pulmonary effort is normal. No respiratory distress.     Breath sounds: Normal breath sounds.  Chest:     Comments: Chest wall stable, nontender Abdominal:     Palpations: Abdomen is soft.     Tenderness: There is no abdominal tenderness.  Musculoskeletal:        General: No swelling.     Cervical back: Neck supple.     Comments: No deformity or tenderness to bilateral ankles, bilateral knees or bilateral hips.  Full range of motion appreciated to bilateral ankles, knees and hips.  Full range of motion of bilateral wrists, elbows, shoulders.  No tenderness.  Skin:    General: Skin is warm and dry.     Capillary Refill: Capillary refill takes less than 2 seconds.     Comments: Skin tear to left elbow.  Neurological:     Mental Status: He is alert. Mental status is at baseline.     GCS: GCS  eye subscore is 4. GCS verbal subscore is 5. GCS motor subscore is 6.     Cranial Nerves: Cranial nerves 2-12 are intact. No cranial nerve deficit.     Sensory: Sensation is intact. No sensory deficit.     Motor: Motor function is intact. No weakness.     Coordination: Coordination is intact. Heel to [facility] Test normal.     Comments: CN III through XII intact.  Intact finger-nose, heel-to-shin.  No pronator drift, no slurred speech or facial droop.  5 out of 5 strength bilateral lower extremities.  5 out of 5 strength bilateral upper extremities.  Psychiatric:        Mood and Affect: Mood normal.     ED Results / Procedures / Treatments   Labs (all labs ordered are listed, but only abnormal results are displayed) Labs Reviewed  CBC - Abnormal; Notable for the following components:      Result Value   RBC  4.15 (*)    HCT 38.2 (*)    All other components within normal limits  BASIC METABOLIC PANEL WITH GFR - Abnormal; Notable for the following components:   Sodium 133 (*)    Glucose, Bld 103 (*)    All other components within normal limits  CK    EKG None  Radiology CT Cervical Spine Wo Contrast Result Date: 10/15/2023 CLINICAL DATA:  Marvell Slider out of bed with unwitnessed fall, staff found him on the floor leaning against the bed with right temple and left forearm abrasion. EXAM: CT HEAD WITHOUT CONTRAST CT CERVICAL SPINE WITHOUT CONTRAST TECHNIQUE: Multidetector CT imaging of the head and cervical spine was performed following the standard protocol without intravenous contrast. Multiplanar CT image reconstructions of the cervical spine were also generated. RADIATION DOSE REDUCTION: This exam was performed according to the departmental dose-optimization program which includes automated exposure control, adjustment of the mA and/or kV according to patient size and/or use of iterative reconstruction technique. COMPARISON:  Head CT and cervical spine CT both 12/27/2022 FINDINGS: CT HEAD FINDINGS Brain: There is moderate to advanced cerebral and mild-to-moderate cerebellar atrophy, with advanced small-vessel disease of the cerebral white matter and atrophic ventriculomegaly. The ventriculomegaly is slightly disproportionate to the cerebral cortical volume loss and should be correlated clinically for the possibility of normal pressure hydrocephalus. There are small chronic left thalamus and bilateral gangliocapsular lacunar infarcts but no acute cortical based infarct is seen. There is no midline shift, mass effect or hemorrhage. Basal cisterns are patent. Vascular: The siphons and both distal vertebral arteries heavily calcified. No hyperdense central vessel is seen. Skull: Negative for fractures or focal lesions. Right temporal scalp contusion. Sinuses/Orbits: Membrane thickening noted in the left greater than  right inferior maxillary sinuses without fluid level. Other paranasal sinuses are clear.  The nasal septum deviates right. There is patchy fluid in the mid to lower left mastoid air cells, increased. Mild fluid in the left middle ear cavity also increased. This could be sterile serous fluid or due to otomastoiditis. The right mastoids and middle ears are clear. Negative orbits apart from old lens replacements. Other: None. CT CERVICAL SPINE FINDINGS Alignment: Slight dextroscoliosis of the cervical spine is noted, reversed lordosis centered at C4, and a chronic 2 mm grade 1 anterolisthesis C3-4 and C7-T1. There is again noted bone-on-bone anterior atlantodental joint space loss with calcified pannus in the joint dorsally. There is no traumatic or progressive listhesis. Skull base and vertebrae: Osteopenia without evidence of displaced fractures, compression deformities of the  vertebral bodies, or focal pathologic process. Soft tissues and spinal canal: No prevertebral fluid or swelling. No visible canal hematoma. There is moderate calcific plaque both proximal cervical ICAs. No laryngeal or thyroid  mass. Disc levels: There is interbody ankylosis across the C2-3 disc and facet joints. There is interbody ankylosis due to anterior bridging enthesopathy C4-5 through C6-7. The discs are chronically collapsed from C4-5 through C6-7, with bidirectional osteophytes C3-4 through C6-7. Spondylotic thecal sac encroachment is greatest at C3-4 where the thecal sac is 5 mm AP and there is mild spondylotic cord compression. Other levels show deformity of the ventral cord by dorsal disc osteophyte complexes but no frank compression. Multilevel left greater than right facet and uncinate arthrosis. Acquired foraminal stenosis is most significant on the left at C3-4, C5-6 and C6-7, as before. Upper chest: Negative. Other: None. IMPRESSION: 1. No acute intracranial CT findings or depressed skull fractures. 2. Right temporal scalp  contusion. 3. Atrophy, small-vessel disease and chronic lacunar infarcts. 4. Ventriculomegaly disproportionate to the cerebral cortical volume loss and should be correlated clinically for the possibility of normal pressure hydrocephalus. 5. Increased fluid in the left mastoid air cells and middle ear cavity. This could be sterile serous fluid or due to otomastoiditis. 6. Osteopenia and degenerative change without evidence of cervical fractures. 7. Chronic grade 1 anterolisthesis C3-4 and C7-T1. 8. Atherosclerosis. 9. Multilevel cervical spondylosis with mild spondylotic cord compression at C3-4. 10. Multilevel left greater than right facet and uncinate arthrosis with acquired foraminal stenosis. Electronically Signed   By: Denman Fischer M.D.   On: 10/15/2023 05:10   CT Head Wo Contrast Result Date: 10/15/2023 CLINICAL DATA:  Marvell Slider out of bed with unwitnessed fall, staff found him on the floor leaning against the bed with right temple and left forearm abrasion. EXAM: CT HEAD WITHOUT CONTRAST CT CERVICAL SPINE WITHOUT CONTRAST TECHNIQUE: Multidetector CT imaging of the head and cervical spine was performed following the standard protocol without intravenous contrast. Multiplanar CT image reconstructions of the cervical spine were also generated. RADIATION DOSE REDUCTION: This exam was performed according to the departmental dose-optimization program which includes automated exposure control, adjustment of the mA and/or kV according to patient size and/or use of iterative reconstruction technique. COMPARISON:  Head CT and cervical spine CT both 12/27/2022 FINDINGS: CT HEAD FINDINGS Brain: There is moderate to advanced cerebral and mild-to-moderate cerebellar atrophy, with advanced small-vessel disease of the cerebral white matter and atrophic ventriculomegaly. The ventriculomegaly is slightly disproportionate to the cerebral cortical volume loss and should be correlated clinically for the possibility of normal  pressure hydrocephalus. There are small chronic left thalamus and bilateral gangliocapsular lacunar infarcts but no acute cortical based infarct is seen. There is no midline shift, mass effect or hemorrhage. Basal cisterns are patent. Vascular: The siphons and both distal vertebral arteries heavily calcified. No hyperdense central vessel is seen. Skull: Negative for fractures or focal lesions. Right temporal scalp contusion. Sinuses/Orbits: Membrane thickening noted in the left greater than right inferior maxillary sinuses without fluid level. Other paranasal sinuses are clear.  The nasal septum deviates right. There is patchy fluid in the mid to lower left mastoid air cells, increased. Mild fluid in the left middle ear cavity also increased. This could be sterile serous fluid or due to otomastoiditis. The right mastoids and middle ears are clear. Negative orbits apart from old lens replacements. Other: None. CT CERVICAL SPINE FINDINGS Alignment: Slight dextroscoliosis of the cervical spine is noted, reversed lordosis centered at C4, and a  chronic 2 mm grade 1 anterolisthesis C3-4 and C7-T1. There is again noted bone-on-bone anterior atlantodental joint space loss with calcified pannus in the joint dorsally. There is no traumatic or progressive listhesis. Skull base and vertebrae: Osteopenia without evidence of displaced fractures, compression deformities of the vertebral bodies, or focal pathologic process. Soft tissues and spinal canal: No prevertebral fluid or swelling. No visible canal hematoma. There is moderate calcific plaque both proximal cervical ICAs. No laryngeal or thyroid  mass. Disc levels: There is interbody ankylosis across the C2-3 disc and facet joints. There is interbody ankylosis due to anterior bridging enthesopathy C4-5 through C6-7. The discs are chronically collapsed from C4-5 through C6-7, with bidirectional osteophytes C3-4 through C6-7. Spondylotic thecal sac encroachment is greatest at  C3-4 where the thecal sac is 5 mm AP and there is mild spondylotic cord compression. Other levels show deformity of the ventral cord by dorsal disc osteophyte complexes but no frank compression. Multilevel left greater than right facet and uncinate arthrosis. Acquired foraminal stenosis is most significant on the left at C3-4, C5-6 and C6-7, as before. Upper chest: Negative. Other: None. IMPRESSION: 1. No acute intracranial CT findings or depressed skull fractures. 2. Right temporal scalp contusion. 3. Atrophy, small-vessel disease and chronic lacunar infarcts. 4. Ventriculomegaly disproportionate to the cerebral cortical volume loss and should be correlated clinically for the possibility of normal pressure hydrocephalus. 5. Increased fluid in the left mastoid air cells and middle ear cavity. This could be sterile serous fluid or due to otomastoiditis. 6. Osteopenia and degenerative change without evidence of cervical fractures. 7. Chronic grade 1 anterolisthesis C3-4 and C7-T1. 8. Atherosclerosis. 9. Multilevel cervical spondylosis with mild spondylotic cord compression at C3-4. 10. Multilevel left greater than right facet and uncinate arthrosis with acquired foraminal stenosis. Electronically Signed   By: Denman Fischer M.D.   On: 10/15/2023 05:10   DG Forearm Left Result Date: 10/15/2023 CLINICAL DATA:  Marvell Slider out of bed with skin laceration left forearm. EXAM: LEFT FOREARM - 2 VIEW COMPARISON:  None Available. FINDINGS: AP Lat left forearm two views. There is osteopenia without evidence of displaced fractures. There is bone-on-bone radiolunate joint space loss and first CMC arthrosis. No radiopaque foreign body is seen. There are patchy calcifications in the radial and ulnar arteries. IMPRESSION: 1. Osteopenia and degenerative change without evidence of displaced fractures. 2. Atherosclerosis. Electronically Signed   By: Denman Fischer M.D.   On: 10/15/2023 04:46    Procedures Procedures   Medications  Ordered in ED Medications - No data to display  ED Course/ Medical Decision Making/ A&P   Medical Decision Making Amount and/or Complexity of Data Reviewed Labs: ordered. Radiology: ordered.   89 year old male presents for evaluation.  Please see HPI for further details.  On examination the patient is afebrile and nontachycardic.  His lung sounds are clear bilaterally, he is nonhypoxic.  Abdomen soft and compressible.  Neurological examination at baseline.  Patient here stating that he does not remember the fall.  Denies preceding chest pain or shortness of breath.  Will collect labs to include CBC, BMP and CK.  Will also collect CT head, CT cervical spine and plain film imaging of left forearm.  Update: CT head, cervical spine unremarkable for acute process.  Plain film imaging of left forearm unremarkable.  Patient labs reassuring.  CBC without leukocytosis or anemia.  Metabolic panel with a slightly soft sodium 133, no other electrolyte derangement.  Anion gap 10.  CK 63.  Patient left skin tear  was cleansed, covered with dressing.  At this time, patient workup reassuring.  Patient will be discharged home to facility.  Advised to follow-up with facility PCP.  Stable to discharge.   Final Clinical Impression(s) / ED Diagnoses Final diagnoses:  Fall, initial encounter    Rx / DC Orders ED Discharge Orders     None         Kristin Peyer 10/15/23 0522    Lindle Rhea, MD 10/15/23 [phone]

## 2023-10-15 NOTE — ED Notes (Signed)
 Patient transported to CT

## 2023-10-15 NOTE — ED Triage Notes (Signed)
 Pt arrives from the [facility] via [facility]. Per their report, unwitnessed fall, staff found him in the floor leaning against the bed. Right temple and left forearm abrasion. Unknown LOC. No blood thinners per reports. En route 158/90, hr 79, rr18, 98% cbg 123. C Collar in place.

## 2023-10-15 NOTE — Discharge Instructions (Addendum)
 It was a pleasure taking part in your care.  As discussed, your workup is reassuring.  CT scans of your head and neck showed no acute fractures.  Please follow-up with your PCP, your facility PCP at your earliest convenience.  Please continue taking all medications as prescribed.  Please continue to monitor your left elbow skin tear.

## 2023-10-15 NOTE — ED Triage Notes (Signed)
 Pt says he fell out of the bed and had been on the floor for some time. He is not having any pain right now. Alert and oriented x 2 (disoriented to time). C collar remains in place. Hematoma right temple and abrasion to the left forearm.

## 2023-12-12 ENCOUNTER — Other Ambulatory Visit: Payer: Self-pay | Admitting: Family Medicine

## 2024-01-22 ENCOUNTER — Other Ambulatory Visit: Payer: Self-pay | Admitting: Family Medicine

## 2024-01-22 DIAGNOSIS — I10 Essential (primary) hypertension: Secondary | ICD-10-CM

## 2024-01-22 DIAGNOSIS — R000 Tachycardia, unspecified: Secondary | ICD-10-CM

## 2024-01-23 NOTE — Telephone Encounter (Signed)
 Called pt son LVM to contact the office if son calls please advised

## 2024-01-24 NOTE — Telephone Encounter (Signed)
 Called patient son LVM to contact the office if pt calls please advised

## 2024-01-25 NOTE — Telephone Encounter (Signed)
 3 attempt called patient son and [facility] no response

## 2024-01-26 DIAGNOSIS — I4891 Unspecified atrial fibrillation: Secondary | ICD-10-CM | POA: Diagnosis not present

## 2024-01-26 DIAGNOSIS — E038 Other specified hypothyroidism: Secondary | ICD-10-CM | POA: Diagnosis not present

## 2024-01-26 DIAGNOSIS — G301 Alzheimer's disease with late onset: Secondary | ICD-10-CM | POA: Diagnosis not present
